# Patient Record
Sex: Female | Born: 1970 | Race: White | Hispanic: No | Marital: Single | Smoking: Never smoker
Health system: Southern US, Community
[De-identification: ages and names within clinical notes are randomized; demographics above are authoritative.]

## PROBLEM LIST (undated history)

## (undated) DIAGNOSIS — M7710 Lateral epicondylitis, unspecified elbow: Secondary | ICD-10-CM

## (undated) DIAGNOSIS — I10 Essential (primary) hypertension: Secondary | ICD-10-CM

## (undated) DIAGNOSIS — K219 Gastro-esophageal reflux disease without esophagitis: Secondary | ICD-10-CM

## (undated) DIAGNOSIS — M797 Fibromyalgia: Secondary | ICD-10-CM

## (undated) DIAGNOSIS — F419 Anxiety disorder, unspecified: Secondary | ICD-10-CM

## (undated) DIAGNOSIS — M5030 Other cervical disc degeneration, unspecified cervical region: Secondary | ICD-10-CM

## (undated) DIAGNOSIS — F329 Major depressive disorder, single episode, unspecified: Secondary | ICD-10-CM

## (undated) DIAGNOSIS — IMO0002 Reserved for concepts with insufficient information to code with codable children: Secondary | ICD-10-CM

## (undated) DIAGNOSIS — F4310 Post-traumatic stress disorder, unspecified: Secondary | ICD-10-CM

## (undated) DIAGNOSIS — R002 Palpitations: Secondary | ICD-10-CM

## (undated) DIAGNOSIS — G5600 Carpal tunnel syndrome, unspecified upper limb: Secondary | ICD-10-CM

## (undated) DIAGNOSIS — R42 Dizziness and giddiness: Secondary | ICD-10-CM

## (undated) DIAGNOSIS — M1990 Unspecified osteoarthritis, unspecified site: Secondary | ICD-10-CM

## (undated) DIAGNOSIS — F410 Panic disorder [episodic paroxysmal anxiety] without agoraphobia: Secondary | ICD-10-CM

## (undated) DIAGNOSIS — F32A Depression, unspecified: Secondary | ICD-10-CM

## (undated) DIAGNOSIS — J45909 Unspecified asthma, uncomplicated: Secondary | ICD-10-CM

## (undated) DIAGNOSIS — E876 Hypokalemia: Secondary | ICD-10-CM

## (undated) DIAGNOSIS — R569 Unspecified convulsions: Secondary | ICD-10-CM

## (undated) DIAGNOSIS — T7840XA Allergy, unspecified, initial encounter: Secondary | ICD-10-CM

## (undated) DIAGNOSIS — R55 Syncope and collapse: Secondary | ICD-10-CM

## (undated) DIAGNOSIS — G43909 Migraine, unspecified, not intractable, without status migrainosus: Secondary | ICD-10-CM

## (undated) HISTORY — DX: Gastro-esophageal reflux disease without esophagitis: K21.9

## (undated) HISTORY — PX: GALLBLADDER SURGERY: SHX652

## (undated) HISTORY — PX: CHOLECYSTECTOMY: SHX55

## (undated) HISTORY — PX: CARPAL TUNNEL RELEASE: SHX101

## (undated) HISTORY — DX: Syncope and collapse: R55

## (undated) HISTORY — DX: Depression, unspecified: F32.A

## (undated) HISTORY — DX: Allergy, unspecified, initial encounter: T78.40XA

## (undated) HISTORY — DX: Lateral epicondylitis, unspecified elbow: M77.10

## (undated) HISTORY — DX: Migraine, unspecified, not intractable, without status migrainosus: G43.909

## (undated) HISTORY — PX: SHOULDER SURGERY: SHX246

## (undated) HISTORY — DX: Essential (primary) hypertension: I10

## (undated) HISTORY — DX: Dizziness and giddiness: R42

## (undated) HISTORY — DX: Unspecified asthma, uncomplicated: J45.909

## (undated) HISTORY — DX: Unspecified osteoarthritis, unspecified site: M19.90

## (undated) HISTORY — DX: Fibromyalgia: M79.7

## (undated) HISTORY — DX: Major depressive disorder, single episode, unspecified: F32.9

## (undated) HISTORY — DX: Panic disorder (episodic paroxysmal anxiety): F41.0

## (undated) HISTORY — DX: Unspecified convulsions: R56.9

## (undated) HISTORY — DX: Reserved for concepts with insufficient information to code with codable children: IMO0002

## (undated) HISTORY — DX: Other cervical disc degeneration, unspecified cervical region: M50.30

## (undated) HISTORY — DX: Hypokalemia: E87.6

## (undated) HISTORY — DX: Anxiety disorder, unspecified: F41.9

## (undated) HISTORY — DX: Carpal tunnel syndrome, unspecified upper limb: G56.00

---

## 1991-03-19 HISTORY — PX: SPINE SURGERY: SHX786

## 1991-03-19 HISTORY — PX: BACK SURGERY: SHX140

## 2005-08-09 ENCOUNTER — Ambulatory Visit: Payer: Self-pay | Admitting: Family Medicine

## 2005-08-15 ENCOUNTER — Ambulatory Visit: Payer: Self-pay | Admitting: Family Medicine

## 2005-08-29 ENCOUNTER — Ambulatory Visit: Payer: Self-pay | Admitting: Family Medicine

## 2005-09-10 ENCOUNTER — Ambulatory Visit: Payer: Self-pay | Admitting: Family Medicine

## 2006-03-21 ENCOUNTER — Ambulatory Visit: Payer: Self-pay | Admitting: Family Medicine

## 2006-04-11 ENCOUNTER — Ambulatory Visit: Payer: Self-pay | Admitting: Family Medicine

## 2006-05-09 ENCOUNTER — Ambulatory Visit: Payer: Self-pay | Admitting: Family Medicine

## 2009-01-25 ENCOUNTER — Encounter: Admission: RE | Admit: 2009-01-25 | Discharge: 2009-01-25 | Payer: Self-pay | Admitting: Pain Medicine

## 2011-02-06 ENCOUNTER — Ambulatory Visit: Payer: Self-pay | Admitting: Physical Medicine and Rehabilitation

## 2012-06-11 ENCOUNTER — Other Ambulatory Visit: Payer: Self-pay | Admitting: Physical Medicine and Rehabilitation

## 2012-06-11 DIAGNOSIS — M5416 Radiculopathy, lumbar region: Secondary | ICD-10-CM

## 2012-06-17 ENCOUNTER — Ambulatory Visit
Admission: RE | Admit: 2012-06-17 | Discharge: 2012-06-17 | Disposition: A | Discharge status: Home / Self Care | Payer: BC Managed Care – PPO | Source: Ambulatory Visit | Attending: Physical Medicine and Rehabilitation | Admitting: Physical Medicine and Rehabilitation

## 2012-06-17 DIAGNOSIS — M5416 Radiculopathy, lumbar region: Secondary | ICD-10-CM

## 2012-06-17 MED ORDER — GADOBENATE DIMEGLUMINE 529 MG/ML IV SOLN
12.0000 mL | Freq: Once | INTRAVENOUS | Status: AC | PRN
  Administered 2012-06-17: 12 mL via INTRAVENOUS

## 2012-07-29 ENCOUNTER — Encounter: Payer: Self-pay | Admitting: Neurology

## 2012-07-29 ENCOUNTER — Ambulatory Visit (INDEPENDENT_AMBULATORY_CARE_PROVIDER_SITE_OTHER): Payer: BC Managed Care – PPO | Admitting: Neurology

## 2012-07-29 VITALS — BP 122/85 | HR 87 | Ht 64.5 in | Wt 144.0 lb

## 2012-07-29 DIAGNOSIS — R569 Unspecified convulsions: Secondary | ICD-10-CM | POA: Insufficient documentation

## 2012-07-29 DIAGNOSIS — R55 Syncope and collapse: Secondary | ICD-10-CM

## 2012-07-29 DIAGNOSIS — G253 Myoclonus: Secondary | ICD-10-CM

## 2012-07-29 NOTE — Progress Notes (Signed)
Reason for visit: Blackouts  Claire [name] is a 42 y.o. female  History of present illness:  Claire [name] is a 42 year old left-handed [name] with a history of fibromyalgia. The patient is sent to this office for evaluation of blackout events that began about 4 months ago. The patient lives alone, and both of the blackout events have occurred when she is by herself. The patient also has an anxiety and panic disorder. The patient has reported episodes of myoclonus that may occur during the night or during the day. The patient has had an event 4 months ago where she was getting ready for bed, and she woke up 6 hours later on the floor with bruises on her. The patient did not bite her tongue or lose control of the bowels or the bladder. The patient has had a second event similar to this, again unwitnessed. The patient may have episodes of slurred speech associated with myoclonus. The myoclonus affects the arms, and may occur on a daily basis. The patient has undergone an EEG study that she was told was unremarkable. The patient does not believe that she has had a scan of the brain. The patient denies any tongue biting or loss of control of the bowels or the bladder with the blackout episodes. No family history of seizures is noted. The patient is sent to this office for further evaluation. The patient is on clonazepam.  Past Medical History  Diagnosis Date  . Tennis elbow   . DDD (degenerative disc disease), cervical   . Vertigo   . Panic attacks   . Acid reflux   . Anxiety   . Migraine   . Fibromyalgia   . Carpal tunnel syndrome     bilateral  . Syncope and collapse     Past Surgical History  Procedure Laterality Date  . Gallbladder surgery    . Shoulder surgery Right   . Back surgery  1993    Family History  Problem Relation Age of Onset  . COPD Mother   . Bronchitis Mother   . Cancer - Colon Maternal Grandmother     Social history:  reports that she has never smoked. She  does not have any smokeless tobacco history on file. She reports that  drinks alcohol. She reports that she does not use illicit drugs.  Medications:  No current outpatient prescriptions on file prior to visit.   No current facility-administered medications on file prior to visit.    Allergies:  Allergies  Allergen Reactions  . Cymbalta (Duloxetine Hcl)   . Penicillins   . Sulfa Antibiotics     ROS:  Out of a complete 14 system review of symptoms, the patient complains only of the following symptoms, and all other reviewed systems are negative.  Fevers, chills, fatigue Chest pain, swelling in the legs Hearing loss, ringing in the ears, stinging sensations, difficulty swallowing Itching, moles Blurred vision Shortness of breath, cough, wheezing, snoring Diarrhea, constipation Easy bruising Feeling hot, cold, flushing Joint pain, joint swelling, muscle cramps, achy muscles Allergies Memory loss, confusion, headache, numbness, weakness, slurred speech, dizziness, seizure episodes, tremors, pseudoseizures Anxiety, insomnia, the crease appetite, racing thoughts, restless legs  Blood pressure 122/85, pulse 87, height 5' 4.5" (1.638 m), weight 144 lb (65.318 kg).  Physical Exam  General: The patient is alert and cooperative at the time of the examination.  Head: Pupils are equal, round, and reactive to light. Discs are flat bilaterally.  Neck: The neck is supple, no  carotid bruits are noted.  Respiratory: The respiratory examination is clear.  Cardiovascular: The cardiovascular examination reveals a regular rate and rhythm, no obvious murmurs or rubs are noted.  Skin: Extremities are without significant edema.  Neurologic Exam  Mental status:  Cranial nerves: Facial symmetry is present. There is good sensation of the face to pinprick and soft touch on the left, decreased on the right. The patient does not split midline with vibration sensation on the forehead. The  strength of the facial muscles and the muscles to head turning and shoulder shrug are normal bilaterally. Speech is well enunciated, no aphasia or dysarthria is noted. Extraocular movements are full. Visual fields are full.  Motor: The motor testing reveals 5 over 5 strength of all 4 extremities. Good symmetric motor tone is noted throughout.  Sensory: Sensory testing is intact to pinprick, soft touch, vibration sensation, and position sense on all 4 extremities, with the exception that pinprick sensation is decreased on the left arm and right leg. No evidence of extinction is noted.  Coordination: Cerebellar testing reveals good finger-nose-finger and heel-to-shin bilaterally.  Gait and station: Gait is normal. Tandem gait is normal. Romberg is negative. No drift is seen.  Reflexes: Deep tendon reflexes are symmetric and normal bilaterally, with the exception that the right ankle jerk reflex is absent. Toes are downgoing bilaterally.   Assessment/Plan:  One. Fibromyalgia  2. Episodes of syncope, rule out seizures  3. Myoclonus  The patient has a history of 2 unwitnessed blackout events. The patient is having myoclonus independent of the blackouts. The patient will undergo an EEG evaluation, and a MRI of the brain. If these studies are unremarkable, an empiric trial on Keppra may be done. It is possible that the events may be psychogenic. The patient has a significant anxiety disorder.  Claire Palau MD 07/29/2012 8:05 PM  [facility] [address] [place], [place] [address]  Phone [phone] Fax [phone]

## 2012-08-12 ENCOUNTER — Telehealth: Payer: Self-pay | Admitting: Neurology

## 2012-08-12 ENCOUNTER — Ambulatory Visit (INDEPENDENT_AMBULATORY_CARE_PROVIDER_SITE_OTHER): Payer: BC Managed Care – PPO

## 2012-08-12 ENCOUNTER — Ambulatory Visit (INDEPENDENT_AMBULATORY_CARE_PROVIDER_SITE_OTHER): Payer: BC Managed Care – PPO | Admitting: Radiology

## 2012-08-12 DIAGNOSIS — G253 Myoclonus: Secondary | ICD-10-CM

## 2012-08-12 DIAGNOSIS — R55 Syncope and collapse: Secondary | ICD-10-CM

## 2012-08-12 NOTE — Telephone Encounter (Signed)
I called patient. EEG study was unremarkable. The patient has undergone the MRI the brain, I do not have the results yet.

## 2012-08-12 NOTE — Procedures (Signed)
  History:  Claire [name] is a 42 year old patient with a history of fibromyalgia. The patient has had 2 episodes of loss of consciousness that occurred at home, unwitnessed. The patient is being evaluated for possible seizures.  This is a routine EEG. No skull defects are noted. Medications include amitriptyline, clonazepam, Flexeril, hydrocodone, and Savella.  EEG classification: Normal awake  Description of the recording: The background rhythms of this recording consists of a fairly well modulated medium amplitude alpha rhythm of 9 Hz that is reactive to eye opening and closure. As the record progresses, the patient appears to remain in the waking state throughout the recording. Photic stimulation was performed, resulting in a bilateral and symmetric photic driving response. Hyperventilation was not performed. At no time during the recording does there appear to be evidence of spike or spike wave discharges or evidence of focal slowing. EKG monitor shows no evidence of cardiac rhythm abnormalities with a heart rate of 90.  Impression: This is a normal EEG recording in the waking state. No evidence of ictal or interictal discharges are seen.

## 2012-08-14 ENCOUNTER — Telehealth: Payer: Self-pay | Admitting: Neurology

## 2012-08-14 MED ORDER — LEVETIRACETAM 250 MG PO TABS: 250.0000 mg | ORAL_TABLET | Freq: Two times a day (BID) | ORAL | Status: DC

## 2012-08-14 MED ORDER — GADOPENTETATE DIMEGLUMINE 469.01 MG/ML IV SOLN: 13.0000 mL | Freq: Once | INTRAVENOUS | Status: AC | PRN

## 2012-08-14 NOTE — Telephone Encounter (Signed)
I called patient. The MRI study of the brain was normal. The patient indicates that she is still having events of jerking and inability to respond. I will start low-dose Keppra. The EEG study was also normal.

## 2013-01-14 ENCOUNTER — Ambulatory Visit: Payer: BC Managed Care – PPO | Admitting: Neurology

## 2013-01-21 ENCOUNTER — Other Ambulatory Visit: Payer: Self-pay

## 2013-02-12 ENCOUNTER — Other Ambulatory Visit: Payer: Self-pay | Admitting: Neurology

## 2013-08-22 ENCOUNTER — Other Ambulatory Visit: Payer: Self-pay | Admitting: Neurology

## 2013-08-23 ENCOUNTER — Telehealth: Payer: Self-pay | Admitting: Neurology

## 2013-08-23 NOTE — Telephone Encounter (Signed)
Left message for Claire [name] at [facility] about second request for medical records and STD form.  I have faxed the form for the second time and also all medical records including Office visit from 07-29-13, MRI from 08-14-13 and EEG 08-12-13.  Received a confirmation of fax being received.

## 2013-09-29 ENCOUNTER — Other Ambulatory Visit: Payer: Self-pay | Admitting: Neurology

## 2013-10-07 ENCOUNTER — Other Ambulatory Visit (HOSPITAL_COMMUNITY): Payer: Self-pay | Admitting: Family Medicine

## 2013-10-07 ENCOUNTER — Ambulatory Visit (HOSPITAL_COMMUNITY)
Admission: RE | Admit: 2013-10-07 | Discharge: 2013-10-07 | Disposition: A | Discharge status: Home / Self Care | Payer: Disability Insurance | Source: Ambulatory Visit | Attending: Family Medicine | Admitting: Family Medicine

## 2013-10-07 DIAGNOSIS — M5137 Other intervertebral disc degeneration, lumbosacral region: Secondary | ICD-10-CM | POA: Insufficient documentation

## 2013-10-07 DIAGNOSIS — M545 Low back pain: Secondary | ICD-10-CM

## 2013-10-07 DIAGNOSIS — M25531 Pain in right wrist: Secondary | ICD-10-CM

## 2013-10-07 DIAGNOSIS — M25512 Pain in left shoulder: Secondary | ICD-10-CM

## 2013-10-07 DIAGNOSIS — M25539 Pain in unspecified wrist: Secondary | ICD-10-CM | POA: Insufficient documentation

## 2013-10-07 DIAGNOSIS — M51379 Other intervertebral disc degeneration, lumbosacral region without mention of lumbar back pain or lower extremity pain: Secondary | ICD-10-CM | POA: Insufficient documentation

## 2013-10-07 DIAGNOSIS — M25532 Pain in left wrist: Secondary | ICD-10-CM

## 2013-10-21 ENCOUNTER — Other Ambulatory Visit (HOSPITAL_COMMUNITY): Payer: Self-pay | Admitting: Physical Medicine and Rehabilitation

## 2013-10-21 ENCOUNTER — Ambulatory Visit (HOSPITAL_COMMUNITY)
Admission: RE | Admit: 2013-10-21 | Discharge: 2013-10-21 | Disposition: A | Discharge status: Home / Self Care | Payer: BC Managed Care – PPO | Source: Ambulatory Visit | Attending: Physical Medicine and Rehabilitation | Admitting: Physical Medicine and Rehabilitation

## 2013-10-21 DIAGNOSIS — R52 Pain, unspecified: Secondary | ICD-10-CM

## 2013-10-21 DIAGNOSIS — M25569 Pain in unspecified knee: Secondary | ICD-10-CM | POA: Insufficient documentation

## 2013-10-29 ENCOUNTER — Other Ambulatory Visit: Payer: Self-pay | Admitting: Neurology

## 2013-12-01 ENCOUNTER — Other Ambulatory Visit: Payer: Self-pay | Admitting: Neurology

## 2013-12-02 NOTE — Telephone Encounter (Signed)
Tried to call patient, however number on file says it's temoprarily out of service.

## 2014-07-28 ENCOUNTER — Encounter: Payer: Self-pay | Admitting: Neurology

## 2014-07-28 ENCOUNTER — Ambulatory Visit (INDEPENDENT_AMBULATORY_CARE_PROVIDER_SITE_OTHER): Payer: Self-pay | Admitting: Neurology

## 2014-07-28 VITALS — BP 110/83 | HR 76 | Ht 64.0 in | Wt 161.2 lb

## 2014-07-28 DIAGNOSIS — R55 Syncope and collapse: Secondary | ICD-10-CM

## 2014-07-28 DIAGNOSIS — M797 Fibromyalgia: Secondary | ICD-10-CM

## 2014-07-28 NOTE — Patient Instructions (Signed)
Fibromyalgia Fibromyalgia is a disorder that is often misunderstood. It is associated with muscular pains and tenderness that comes and goes. It is often associated with fatigue and sleep disturbances. Though it tends to be long-lasting, fibromyalgia is not life-threatening. CAUSES  The exact cause of fibromyalgia is unknown. People with certain gene types are predisposed to developing fibromyalgia and other conditions. Certain factors can play a role as triggers, such as:  Spine disorders.  Arthritis.  Severe injury (trauma) and other physical stressors.  Emotional stressors. SYMPTOMS   The main symptom is pain and stiffness in the muscles and joints, which can vary over time.  Sleep and fatigue problems. Other related symptoms may include:  Bowel and bladder problems.  Headaches.  Visual problems.  Problems with odors and noises.  Depression or mood changes.  Painful periods (dysmenorrhea).  Dryness of the skin or eyes. DIAGNOSIS  There are no specific tests for diagnosing fibromyalgia. Patients can be diagnosed accurately from the specific symptoms they have. The diagnosis is made by determining that nothing else is causing the problems. TREATMENT  There is no cure. Management includes medicines and an active, healthy lifestyle. The goal is to enhance physical fitness, decrease pain, and improve sleep. HOME CARE INSTRUCTIONS   Only take over-the-counter or prescription medicines as directed by your caregiver. Sleeping pills, tranquilizers, and pain medicines may make your problems worse.  Low-impact aerobic exercise is very important and advised for treatment. At first, it may seem to make pain worse. Gradually increasing your tolerance will overcome this feeling.  Learning relaxation techniques and how to control stress will help you. Biofeedback, visual imagery, hypnosis, muscle relaxation, yoga, and meditation are all options.  Anti-inflammatory medicines and  physical therapy may provide short-term help.  Acupuncture or massage treatments may help.  Take muscle relaxant medicines as suggested by your caregiver.  Avoid stressful situations.  Plan a healthy lifestyle. This includes your diet, sleep, rest, exercise, and friends.  Find and practice a hobby you enjoy.  Join a fibromyalgia support group for interaction, ideas, and sharing advice. This may be helpful. SEEK MEDICAL CARE IF:  You are not having good results or improvement from your treatment. FOR MORE INFORMATION  National Fibromyalgia Association: www.fmaware.[place]: www.arthritis.org Document Released: 03/04/2005 Document Revised: 05/27/2011 Document Reviewed: 06/14/2009 [facility] Patient Information 2015 [place], [place]. This information is not intended to replace advice given to you by your health care provider. Make sure you discuss any questions you have with your health care provider. Fibromyalgia Fibromyalgia is a disorder that is often misunderstood. It is associated with muscular pains and tenderness that comes and goes. It is often associated with fatigue and sleep disturbances. Though it tends to be long-lasting, fibromyalgia is not life-threatening. CAUSES  The exact cause of fibromyalgia is unknown. People with certain gene types are predisposed to developing fibromyalgia and other conditions. Certain factors can play a role as triggers, such as:  Spine disorders.  Arthritis.  Severe injury (trauma) and other physical stressors.  Emotional stressors. SYMPTOMS   The main symptom is pain and stiffness in the muscles and joints, which can vary over time.  Sleep and fatigue problems. Other related symptoms may include:  Bowel and bladder problems.  Headaches.  Visual problems.  Problems with odors and noises.  Depression or mood changes.  Painful periods (dysmenorrhea).  Dryness of the skin or eyes. DIAGNOSIS  There are no specific  tests for diagnosing fibromyalgia. Patients can be diagnosed accurately from the specific symptoms  they have. The diagnosis is made by determining that nothing else is causing the problems. TREATMENT  There is no cure. Management includes medicines and an active, healthy lifestyle. The goal is to enhance physical fitness, decrease pain, and improve sleep. HOME CARE INSTRUCTIONS   Only take over-the-counter or prescription medicines as directed by your caregiver. Sleeping pills, tranquilizers, and pain medicines may make your problems worse.  Low-impact aerobic exercise is very important and advised for treatment. At first, it may seem to make pain worse. Gradually increasing your tolerance will overcome this feeling.  Learning relaxation techniques and how to control stress will help you. Biofeedback, visual imagery, hypnosis, muscle relaxation, yoga, and meditation are all options.  Anti-inflammatory medicines and physical therapy may provide short-term help.  Acupuncture or massage treatments may help.  Take muscle relaxant medicines as suggested by your caregiver.  Avoid stressful situations.  Plan a healthy lifestyle. This includes your diet, sleep, rest, exercise, and friends.  Find and practice a hobby you enjoy.  Join a fibromyalgia support group for interaction, ideas, and sharing advice. This may be helpful. SEEK MEDICAL CARE IF:  You are not having good results or improvement from your treatment. FOR MORE INFORMATION  National Fibromyalgia Association: www.fmaware.[place]: www.arthritis.org Document Released: 03/04/2005 Document Revised: 05/27/2011 Document Reviewed: 06/14/2009 [facility] Patient Information 2015 [place], [place]. This information is not intended to replace advice given to you by your health care provider. Make sure you discuss any questions you have with your health care provider.

## 2014-07-28 NOTE — Progress Notes (Signed)
Reason for visit: Fibromyalgia  Claire [name] is an 44 y.o. female  History of present illness:  Claire [name] is a 44 year old left-handed [place] female with a history of syncopal events and myoclonus, last seen in May 2014. The patient had MRI evaluation of the brain that was unremarkable, and a normal EEG study. She was given an empiric trial on Keppra in low dose 250 mg twice daily, but she never returned for a revisit. The patient apparently has had some recent blackouts in March 2016. The patient again was alone at home. She blacked out shortly after getting up out of bed and walking. The patient indicated that she was dizzy, every time she tried to stand up she would faint. She was found to have a low blood pressure in the emergency room, the patient went to [facility]. The patient apparently had the Keppra dose increased to 500 mg twice daily. She indicates that she has not had any further blackout episodes. The patient has developed total body pain. She has neck pain, back pain, leg pain, arm pain, and some headache. The patient has been treated for fibromyalgia, she has found that the [facility] has been helpful, but she has not been able to get it, as she has no insurance. Lyrica and gabapentin in the past have not been helpful. The patient is trying to get on disability, and she comes to this office for further evaluation. She has had MRI evaluation of the lumbar spine done previously, this shows a right paracentral disc herniation L5 S1 level, but this does not appear to be impinging on the exiting nerve root. The patient does report a lot of dizziness with standing.  Past Medical History  Diagnosis Date  . Tennis elbow   . DDD (degenerative disc disease), cervical   . Vertigo   . Panic attacks   . Acid reflux   . Anxiety   . Migraine   . Fibromyalgia   . Carpal tunnel syndrome     bilateral  . Syncope and collapse     Past Surgical History  Procedure Laterality Date  .  Gallbladder surgery    . Shoulder surgery Right   . Back surgery  1993    Family History  Problem Relation Age of Onset  . COPD Mother   . Bronchitis Mother   . Cancer - Colon Maternal Grandmother     Social history:  reports that she has never smoked. She does not have any smokeless tobacco history on file. She reports that she drinks alcohol. She reports that she does not use illicit drugs.    Allergies  Allergen Reactions  . Claritin [Loratadine]   . Cymbalta [Duloxetine Hcl]   . Penicillins   . Sulfa Antibiotics     Medications:  Prior to Admission medications   Medication Sig Start Date End Date Taking? Authorizing Provider  amitriptyline (ELAVIL) 10 MG tablet Take 20 mg by mouth at bedtime.  07/16/12  Yes Historical Provider, MD  BLACK COHOSH PO Take 1 tablet by mouth 3 (three) times daily.   Yes Historical Provider, MD  Cyanocobalamin (B-12) 2500 MCG TABS Take 2,500 Units by mouth daily.   Yes Historical Provider, MD  HYDROcodone-acetaminophen (NORCO/VICODIN) 5-325 MG per tablet Take 5-325 tablets by mouth 4 (four) times daily. 07/16/12  Yes Historical Provider, MD  ibuprofen (ADVIL,MOTRIN) 800 MG tablet Take 800 mg by mouth every 8 (eight) hours as needed.   Yes Historical Provider, MD  levETIRAcetam (KEPPRA)  250 MG tablet TAKE ONE TABLET BY MOUTH EVERY 12 HOURS Patient taking differently: TAKE TWO TABLET BY MOUTH EVERY 12 HOURS 10/29/13  Yes Kathrynn Ducking, MD  Potassium 99 MG TABS Take 2 tablets by mouth daily.    Yes Historical Provider, MD  tiZANidine (ZANAFLEX) 4 MG tablet Take 4 mg by mouth every 8 (eight) hours as needed for muscle spasms.   Yes Historical Provider, MD  SAVELLA 100 MG TABS tablet Take 100 mg by mouth 2 (two) times daily. 06/24/12   Historical Provider, MD    ROS:  Out of a complete 14 system review of symptoms, the patient complains only of the following symptoms, and all other reviewed systems are negative.  Decreased appetite, fatigue Hearing  loss, ear pain, ringing in the ears Eye itching, light sensitivity, eye pain, blurred vision Wheezing, shortness of breath, chest tightness Excessive thirst Restless legs, insomnia, frequent waking, snoring Joint pain, joint swelling, back pain, achy muscles, muscle cramps, walking difficulty, neck pain, neck stiffness Itching Bruising easily Memory loss, dizziness, headache, numbness, seizures, weakness, tremors Agitation, confusion, depression, anxiety  Blood pressure 110/83, pulse 76, height 5\' 4"  (1.626 m), weight 161 lb 3.2 oz (73.12 kg).   Blood pressure, right arm, standing is 144/90. Blood pressure, sitting, right arm is 136/90.  Physical Exam  General: The patient is alert and cooperative at the time of the examination.  Neuromuscular: The patient lacks about 15 of lateral rotation of the cervical spine bilaterally. The patient has good full flexion of the low back.  Skin: No significant peripheral edema is noted.   Neurologic Exam  Mental status: The patient is alert and oriented x 3 at the time of the examination. The patient has apparent normal recent and remote memory, with an apparently normal attention span and concentration ability.   Cranial nerves: Facial symmetry is present. Speech is normal, no aphasia or dysarthria is noted. Extraocular movements are full. Visual fields are full.  Motor: The patient has good strength in all 4 extremities.  Sensory examination: Soft touch sensation is decreased on the left face, arm, and leg. The patient has decreased vibration sensation on the left face, arm, and leg. The patient splits the midline with vibration sensation on the forehead, decreased on the left.  Coordination: The patient has good finger-nose-finger and heel-to-shin bilaterally.  Gait and station: The patient has a limping type gait. Tandem gait is slightly unsteady. Romberg is negative. No drift is seen.  Reflexes: Deep tendon reflexes are  symmetric.   MRI lumbar 06/18/12:  IMPRESSION: Chronic lower thoracic and lumbar disc degeneration is largely stable. There is increased right paracentral disc herniation and L5-S1 which most affect the descending right S1 nerve roots in the lateral recess. Evidence of prior surgery on the left at L4-L5.  * MRI scan images were reviewed online. I agree with the written report.    Assessment/Plan:  1. Probable fibromyalgia, total body pain  2. 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 Nonorganic clinical examination  The MRI of the lumbar spine shows a paracentral disc herniation, this is likely not clinically significant. The patient has total body pain that likely represents fibromyalgia. The patient has no insurance, she is unable to afford the [facility] that seemed to help previously. The patient will have nerve conduction studies of the left arm, and both legs. We will do EMG evaluation on both legs. She will follow-up for the above study. The patient is getting her medications through the health department  currently. She has not worked in one year. A form was filled out to justify disability for the next 3 months. All of the syncopal events apparently have been unwitnessed. The patient has a nonorganic clinical examination, splitting the midline of the forehead with vibration sensation on the left, and a left hemisensory deficit that likely is nonorganic.  Jill Alexanders MD 07/28/2014 7:48 PM  [facility] [address] [place], [place] [address]  Phone [phone] Fax [phone]

## 2014-08-11 ENCOUNTER — Encounter: Payer: Self-pay | Admitting: Neurology

## 2014-08-11 ENCOUNTER — Ambulatory Visit (INDEPENDENT_AMBULATORY_CARE_PROVIDER_SITE_OTHER): Payer: Self-pay | Admitting: Neurology

## 2014-08-11 DIAGNOSIS — M797 Fibromyalgia: Secondary | ICD-10-CM

## 2014-08-11 DIAGNOSIS — R55 Syncope and collapse: Secondary | ICD-10-CM

## 2014-08-11 MED ORDER — TRAMADOL HCL 50 MG PO TABS: 50.0000 mg | ORAL_TABLET | Freq: Four times a day (QID) | ORAL | Status: DC | PRN

## 2014-08-11 MED ORDER — AMITRIPTYLINE HCL 25 MG PO TABS: ORAL_TABLET | ORAL | Status: DC

## 2014-08-11 NOTE — Progress Notes (Signed)
Please refer to EMG and nerve conduction study procedure note. 

## 2014-08-11 NOTE — Procedures (Signed)
     HISTORY:   Claire [name] is a 44 year old patient with a history of chronic diffuse neuromuscular discomfort. She has had lumbosacral spine surgery done in 1993 , and she reports ongoing discomfort in both legs, left greater than right. She is being evaluated for a possible neuropathy or a lumbar radiculopathy.  NERVE CONDUCTION STUDIES:  Nerve conduction studies were performed on the left upper extremity. The distal motor latencies and motor amplitudes for the median and ulnar nerves were within normal limits. The F wave latencies and nerve conduction velocities for these nerves were also normal. The sensory latencies for the median and ulnar nerves were normal.  Nerve conduction studies were performed on both lower extremities. The distal motor latencies and motor amplitudes for the peroneal and posterior tibial nerves were within normal limits. The nerve conduction velocities for these nerves were also normal. The H reflex latencies were normal. The sensory latencies for the peroneal nerves were within normal limits.   EMG STUDIES:  EMG study was performed on the right lower extremity:  The tibialis anterior muscle reveals 2 to 4K motor units with full recruitment. No fibrillations or positive waves were seen. The peroneus tertius muscle reveals 2 to 4K motor units with full recruitment. No fibrillations or positive waves were seen. The medial gastrocnemius muscle reveals 1 to 3K motor units with full recruitment. No fibrillations or positive waves were seen. The vastus lateralis muscle reveals 2 to 4K motor units with full recruitment. No fibrillations or positive waves were seen. The iliopsoas muscle reveals 2 to 4K motor units with full recruitment. No fibrillations or positive waves were seen. The biceps femoris muscle (long head) reveals 2 to 4K motor units with full recruitment. No fibrillations or positive waves were seen. The lumbosacral paraspinal muscles were tested at 3  levels, and revealed no abnormalities of insertional activity at all 3 levels tested. There was good relaxation.  EMG study was performed on the left lower extremity:  The tibialis anterior muscle reveals 2 to 4K motor units with full recruitment. No fibrillations or positive waves were seen. The peroneus tertius muscle reveals 2 to 4K motor units with full recruitment. No fibrillations or positive waves were seen. The medial gastrocnemius muscle reveals 1 to 3K motor units with full recruitment. No fibrillations or positive waves were seen. The vastus lateralis muscle reveals 2 to 4K motor units with full recruitment. No fibrillations or positive waves were seen. The iliopsoas muscle reveals 2 to 4K motor units with full recruitment. No fibrillations or positive waves were seen. The biceps femoris muscle (long head) reveals 2 to 4K motor units with full recruitment. No fibrillations or positive waves were seen. The lumbosacral paraspinal muscles were tested at 3 levels, and revealed no abnormalities of insertional activity at all 3 levels tested. There was good relaxation.   IMPRESSION:  Nerve conduction studies done on the left upper extremity and both lower extremities were unremarkable. There is no evidence of a peripheral neuropathy. EMG evaluation of both lower extremities were unremarkable, without evidence of an overlying lumbosacral radiculopathy.  Jill Alexanders MD 08/11/2014 1:52 PM  [facility] [address] [place],  [address]  Phone [phone] Fax [phone]

## 2014-08-11 NOTE — Progress Notes (Signed)
Nerve conduction study done on the left arm and both legs today was normal. EMG of both legs were normal. The patient is not on a medications for discomfort at this time. I will start the amitriptyline at 25 mg daily for 3 weeks, then go to 50 mg at night.   The patient will follow-up in 3 to 4 months.

## 2014-08-12 ENCOUNTER — Telehealth: Payer: Self-pay | Admitting: Neurology

## 2014-08-12 NOTE — Telephone Encounter (Signed)
Pt called and stated she was supposed to get a Rx.traMADol (ULTRAM) 50 MG tablet but the pharmacy is stating they did not receive it. Please call and advise.   [id number] - [place], [place] - [place]

## 2014-08-12 NOTE — Telephone Encounter (Signed)
This Rx was sent.  I called the pharmacy.  They verified they did get the Rx, and are still in the process of filling it.  They will notify patient when itis ready for pick up.  I called the patient back to advise.  Got no answer.  Left message.

## 2014-09-12 ENCOUNTER — Telehealth: Payer: Self-pay | Admitting: Neurology

## 2014-09-12 MED ORDER — KETOROLAC TROMETHAMINE 10 MG PO TABS: 10.0000 mg | ORAL_TABLET | Freq: Three times a day (TID) | ORAL | Status: DC | PRN

## 2014-09-12 NOTE — Telephone Encounter (Signed)
Patient called stating that she has been sick since taking traMADol (ULTRAM) 50 MG tablet. Patient is experiencing light head, dizziness, nausea and weak. Please call and advise. Patient can be reached @ [phone]

## 2014-09-12 NOTE — Telephone Encounter (Signed)
I called the patient. She stated that she has felt dizzy and nauseous since starting Tramadol. I asked her when she had the prescription filled and she stated 5/27. I also asked if she has felt this way ever since she started taking it and she said yes. She would like to know if the Tramadol can be switched to another medication.

## 2014-09-12 NOTE — Telephone Encounter (Signed)
I called patient. The patient indicates that Ultram makes her dizzy. She will stop this medication, I will call in Toradol if needed for pain.

## 2014-09-13 NOTE — Telephone Encounter (Addendum)
Patient called inquiring if  And what pain medication had been called to pharmacy. She call [facility] and was told nothing had been called. Please call and advise. Patient can be reached at [phone].

## 2014-09-13 NOTE — Telephone Encounter (Signed)
It appears a new Rx was sent by provider yesterday.  I called the pharmacy.  Spoke with [employer]  She verified the Rx is ready, but patient has not picked it up.  I called the patient back.  She is aware and will follow up with the pharmacy.  She will cal [place] back if anything further is needed.

## 2014-09-16 ENCOUNTER — Ambulatory Visit: Payer: Self-pay | Admitting: Neurology

## 2014-09-29 ENCOUNTER — Telehealth: Payer: Self-pay | Admitting: Neurology

## 2014-09-29 MED ORDER — TAPENTADOL HCL 50 MG PO TABS: 50.0000 mg | ORAL_TABLET | Freq: Four times a day (QID) | ORAL | Status: DC | PRN

## 2014-09-29 NOTE — Telephone Encounter (Signed)
Patient called and stated that the pain medication she has been taking is causing problems with her ulcer. She has not been able to eat recently because of the pain and would like to know if there is something else she can take. Please call and advise.

## 2014-09-29 NOTE — Telephone Encounter (Signed)
I called the patient. The patient indicates that the Toradol worsens her reflux problems, she cannot take it. She got nauseated from Ultram, I will try Nucynta to see if this helps.

## 2014-09-29 NOTE — Telephone Encounter (Signed)
I spoke to the patient. She stated the Toradol did not help with the pain and it is making her stomach ulcers worse. She stated that ever since she started it she has had terrible heartburn. She would like to try a different pain medication.

## 2014-09-29 NOTE — Telephone Encounter (Signed)
I called the patient. Left voicemail.

## 2014-09-30 ENCOUNTER — Telehealth: Payer: Self-pay

## 2014-09-30 NOTE — Telephone Encounter (Signed)
Rx ready for pick up. 

## 2014-10-03 NOTE — Telephone Encounter (Signed)
Patient called stating she will pick up RX today or tomorrow

## 2014-10-10 ENCOUNTER — Telehealth: Payer: Self-pay | Admitting: Neurology

## 2014-10-10 MED ORDER — TAPENTADOL HCL 50 MG PO TABS: 50.0000 mg | ORAL_TABLET | Freq: Four times a day (QID) | ORAL | Status: DC | PRN

## 2014-10-10 NOTE — Telephone Encounter (Signed)
Patient called requesting refill for tapentadol (NUCYNTA) 50 MG TABS tablet and also she needs appt with Dr Jannifer Franklin. She has paperwork to be filled out for Ins purposes. Please call to schedule an appt time for her. She can be reached at [phone].

## 2014-10-10 NOTE — Telephone Encounter (Signed)
Patient called returning [facility] phone call.

## 2014-10-10 NOTE — Telephone Encounter (Signed)
Patient is requesting a Rx for Nucynta.  This was last prescribed on 07/14 for #30.  I called the pharmacy, and they verified this Rx was filled and picked up by the patient.  Would you like to refill at this time?  Please advise.  Thank you.

## 2014-10-10 NOTE — Telephone Encounter (Signed)
I will write her prescription for 60 tablets, must last 28 days.

## 2014-10-10 NOTE — Telephone Encounter (Signed)
I called patient and left a voicemail. Will try again.

## 2014-10-10 NOTE — Telephone Encounter (Signed)
Appointment scheduled for 8/9 at 12 PM.

## 2014-10-11 ENCOUNTER — Telehealth: Payer: Self-pay

## 2014-10-11 NOTE — Telephone Encounter (Signed)
Rx ready for pick up. 

## 2014-10-25 ENCOUNTER — Ambulatory Visit (INDEPENDENT_AMBULATORY_CARE_PROVIDER_SITE_OTHER): Payer: Self-pay | Admitting: Neurology

## 2014-10-25 ENCOUNTER — Encounter: Payer: Self-pay | Admitting: Neurology

## 2014-10-25 VITALS — BP 124/95 | HR 92 | Ht 63.0 in | Wt 161.6 lb

## 2014-10-25 DIAGNOSIS — M797 Fibromyalgia: Secondary | ICD-10-CM

## 2014-10-25 DIAGNOSIS — R251 Tremor, unspecified: Secondary | ICD-10-CM

## 2014-10-25 MED ORDER — AMITRIPTYLINE HCL 25 MG PO TABS: 75.0000 mg | ORAL_TABLET | Freq: Every day | ORAL | Status: DC

## 2014-10-25 NOTE — Progress Notes (Signed)
Reason for visit: Fibromyalgia  Claire [name] is an 44 y.o. female  History of present illness:  Claire [name] is a 44 year old left-handed [name] female with a history of fibromyalgia with total body pain. She has had nonorganic features to the sensory examination, with a left hemisensory deficit, splitting midline of the forehead with vibration sensation. The patient continues to have ongoing discomfort, she is out of work. She indicates that over the last 1 week she has developed tremors of both arms. The patient is on amitriptyline taking 50 mg night, she is still not sleeping well, still has ongoing discomfort. She is on Keppra, she reports no further syncopal events. She returns this office for an evaluation. No other significant new issues have been noted with exception of the tremor.  Past Medical History  Diagnosis Date  . Tennis elbow   . DDD (degenerative disc disease), cervical   . Vertigo   . Panic attacks   . Acid reflux   . Anxiety   . Migraine   . Fibromyalgia   . Carpal tunnel syndrome     bilateral  . Syncope and collapse     Past Surgical History  Procedure Laterality Date  . Gallbladder surgery    . Shoulder surgery Right   . Back surgery  1993    Family History  Problem Relation Age of Onset  . COPD Mother   . Bronchitis Mother   . Cancer - Colon Maternal Grandmother     Social history:  reports that she has never smoked. She has never used smokeless tobacco. She reports that she does not drink alcohol or use illicit drugs.    Allergies  Allergen Reactions  . Claritin [Loratadine]   . Ultram [Tramadol]     Dizzy  . Cymbalta [Duloxetine Hcl]   . Penicillins   . Sulfa Antibiotics     Medications:  Prior to Admission medications   Medication Sig Start Date End Date Taking? Authorizing Provider  amitriptyline (ELAVIL) 25 MG tablet 1 tablet at night for 3 weeks, then take 2 tablets at night 08/11/14  Yes Kathrynn Ducking, MD  levETIRAcetam  (KEPPRA) 500 MG tablet Take 500 mg by mouth 2 (two) times daily.   Yes Historical Provider, MD  Multiple Vitamin (MULTIVITAMIN) tablet Take 1 tablet by mouth daily.   Yes Historical Provider, MD  Potassium 99 MG TABS Take 2 tablets by mouth daily.    Yes Historical Provider, MD    ROS:  Out of a complete 14 system review of symptoms, the patient complains only of the following symptoms, and all other reviewed systems are negative.  Activity change excessive sweating Ear pain, ringing in the ears Shortness of breath, chest tightness Excessive thirst Restless legs, insomnia Frequency of urination Joint pain, joint swelling, back pain, achy muscles, muscle cramps, walking difficulty, neck pain, neck stiffness Memory loss, dizziness, headache, numbness, weakness, tremors Anxiety  Blood pressure 124/95, pulse 92, height 5\' 3"  (1.6 m), weight 161 lb 9.6 oz (73.301 kg).  Physical Exam  General: The patient is alert and cooperative at the time of the examination. The patient is moderately obese.  Skin: No significant peripheral edema is noted.   Neurologic Exam  Mental status: The patient is alert and oriented x 3 at the time of the examination. The patient has apparent normal recent and remote memory, with an apparently normal attention span and concentration ability.   Cranial nerves: Facial symmetry is present. Speech is normal, no  aphasia or dysarthria is noted. Extraocular movements are full. Visual fields are full.  Motor: The patient has good strength in all 4 extremities.  Sensory examination: Soft touch sensation is notable for decrease in soft touch and vibration sensation on the left arm and left leg. The patient has decreased sensation on the left face, splits the midline of with the forehead with vibration sensation..  Coordination: The patient has good finger-nose-finger and heel-to-shin bilaterally.  Gait and station: The patient has a normal gait. Tandem gait is  slightly unsteady. Romberg is negative. No drift is seen.  Reflexes: Deep tendon reflexes are symmetric.   Assessment/Plan:  1. Fibromyalgia  2. History of syncope  3. Tremor, new onset  4. Nonorganic neurologic examination  The patient continues to have a left hemisensory deficit that appears to be nonorganic in nature. She is having ongoing symptoms of fibromyalgia, apparently still out of work. I have signed a form indicating that she may return to work at any time from a neurologic standpoint. The patient will be increased on the amitriptyline taking 75 mg at night. She will have blood work to include a TSH to check for the etiology of the tremor. She will follow-up in 6 months, sooner if needed.  Jill Alexanders MD 10/25/2014 8:50 PM  [facility] [address] [place], [place] [address]  Phone [phone] Fax [phone]

## 2014-10-25 NOTE — Patient Instructions (Addendum)
   We will go up on amitriptyline taking 75 mg at night, we will check blood work today for the thyroid given the onset of tremor.   Fibromyalgia Fibromyalgia is a disorder that is often misunderstood. It is associated with muscular pains and tenderness that comes and goes. It is often associated with fatigue and sleep disturbances. Though it tends to be long-lasting, fibromyalgia is not life-threatening. CAUSES  The exact cause of fibromyalgia is unknown. People with certain gene types are predisposed to developing fibromyalgia and other conditions. Certain factors can play a role as triggers, such as:  Spine disorders.  Arthritis.  Severe injury (trauma) and other physical stressors.  Emotional stressors. SYMPTOMS   The main symptom is pain and stiffness in the muscles and joints, which can vary over time.  Sleep and fatigue problems. Other related symptoms may include:  Bowel and bladder problems.  Headaches.  Visual problems.  Problems with odors and noises.  Depression or mood changes.  Painful periods (dysmenorrhea).  Dryness of the skin or eyes. DIAGNOSIS  There are no specific tests for diagnosing fibromyalgia. Patients can be diagnosed accurately from the specific symptoms they have. The diagnosis is made by determining that nothing else is causing the problems. TREATMENT  There is no cure. Management includes medicines and an active, healthy lifestyle. The goal is to enhance physical fitness, decrease pain, and improve sleep. HOME CARE INSTRUCTIONS   Only take over-the-counter or prescription medicines as directed by your caregiver. Sleeping pills, tranquilizers, and pain medicines may make your problems worse.  Low-impact aerobic exercise is very important and advised for treatment. At first, it may seem to make pain worse. Gradually increasing your tolerance will overcome this feeling.  Learning relaxation techniques and how to control stress will help you.  Biofeedback, visual imagery, hypnosis, muscle relaxation, yoga, and meditation are all options.  Anti-inflammatory medicines and physical therapy may provide short-term help.  Acupuncture or massage treatments may help.  Take muscle relaxant medicines as suggested by your caregiver.  Avoid stressful situations.  Plan a healthy lifestyle. This includes your diet, sleep, rest, exercise, and friends.  Find and practice a hobby you enjoy.  Join a fibromyalgia support group for interaction, ideas, and sharing advice. This may be helpful. SEEK MEDICAL CARE IF:  You are not having good results or improvement from your treatment. FOR MORE INFORMATION  National Fibromyalgia Association: www.fmaware.[place]: www.arthritis.org Document Released: 03/04/2005 Document Revised: 05/27/2011 Document Reviewed: 06/14/2009 [facility] Patient Information 2015 [place], [place]. This information is not intended to replace advice given to you by your health care provider. Make sure you discuss any questions you have with your health care provider.

## 2014-10-26 ENCOUNTER — Telehealth: Payer: Self-pay

## 2014-10-26 LAB — TSH: TSH: 0.553 u[IU]/mL (ref 0.450–4.500)

## 2014-10-26 NOTE — Telephone Encounter (Signed)
I called the patient and relayed results. 

## 2014-10-26 NOTE — Telephone Encounter (Signed)
-----   Message from Kathrynn Ducking, MD sent at 10/26/2014  7:27 AM EDT -----  The blood work results are unremarkable. Please call the patient.  ----- Message -----    From: [facility] Lab Results In Interface    Sent: 10/26/2014   5:44 AM      To: Kathrynn Ducking, MD

## 2014-10-31 NOTE — Telephone Encounter (Signed)
Error

## 2015-03-24 NOTE — Telephone Encounter (Signed)
Patient did not pick up this Rx.

## 2015-04-04 ENCOUNTER — Encounter (HOSPITAL_COMMUNITY): Payer: Self-pay | Admitting: Emergency Medicine

## 2015-04-04 ENCOUNTER — Emergency Department (HOSPITAL_COMMUNITY)
Admission: EM | Admit: 2015-04-04 | Discharge: 2015-04-04 | Disposition: A | Discharge status: Home / Self Care | Payer: Self-pay | Attending: Emergency Medicine | Admitting: Emergency Medicine

## 2015-04-04 DIAGNOSIS — J45909 Unspecified asthma, uncomplicated: Secondary | ICD-10-CM | POA: Insufficient documentation

## 2015-04-04 DIAGNOSIS — F410 Panic disorder [episodic paroxysmal anxiety] without agoraphobia: Secondary | ICD-10-CM | POA: Insufficient documentation

## 2015-04-04 DIAGNOSIS — Z79899 Other long term (current) drug therapy: Secondary | ICD-10-CM | POA: Insufficient documentation

## 2015-04-04 DIAGNOSIS — R42 Dizziness and giddiness: Secondary | ICD-10-CM | POA: Insufficient documentation

## 2015-04-04 DIAGNOSIS — Z8679 Personal history of other diseases of the circulatory system: Secondary | ICD-10-CM | POA: Insufficient documentation

## 2015-04-04 DIAGNOSIS — Z8719 Personal history of other diseases of the digestive system: Secondary | ICD-10-CM | POA: Insufficient documentation

## 2015-04-04 DIAGNOSIS — Z8739 Personal history of other diseases of the musculoskeletal system and connective tissue: Secondary | ICD-10-CM | POA: Insufficient documentation

## 2015-04-04 DIAGNOSIS — R6884 Jaw pain: Secondary | ICD-10-CM | POA: Insufficient documentation

## 2015-04-04 DIAGNOSIS — J40 Bronchitis, not specified as acute or chronic: Secondary | ICD-10-CM

## 2015-04-04 DIAGNOSIS — Z8669 Personal history of other diseases of the nervous system and sense organs: Secondary | ICD-10-CM | POA: Insufficient documentation

## 2015-04-04 DIAGNOSIS — Z880 Allergy status to penicillin: Secondary | ICD-10-CM | POA: Insufficient documentation

## 2015-04-04 LAB — CBC WITH DIFFERENTIAL/PLATELET
Basophils Absolute: 0 10*3/uL (ref 0.0–0.1)
Basophils Relative: 0 %
Eosinophils Absolute: 0 10*3/uL (ref 0.0–0.7)
Eosinophils Relative: 0 %
HCT: 40 % (ref 36.0–46.0)
Hemoglobin: 14.1 g/dL (ref 12.0–15.0)
Lymphocytes Relative: 10 %
Lymphs Abs: 1.4 10*3/uL (ref 0.7–4.0)
MCH: 32.9 pg (ref 26.0–34.0)
MCHC: 35.3 g/dL (ref 30.0–36.0)
MCV: 93.5 fL (ref 78.0–100.0)
Monocytes Absolute: 0.4 10*3/uL (ref 0.1–1.0)
Monocytes Relative: 3 %
Neutro Abs: 11.3 10*3/uL — ABNORMAL HIGH (ref 1.7–7.7)
Neutrophils Relative %: 87 %
Platelets: 402 10*3/uL — ABNORMAL HIGH (ref 150–400)
RBC: 4.28 MIL/uL (ref 3.87–5.11)
RDW: 12.6 % (ref 11.5–15.5)
WBC: 13.2 10*3/uL — ABNORMAL HIGH (ref 4.0–10.5)

## 2015-04-04 LAB — COMPREHENSIVE METABOLIC PANEL
ALT: 16 U/L (ref 14–54)
AST: 18 U/L (ref 15–41)
Albumin: 4.2 g/dL (ref 3.5–5.0)
Alkaline Phosphatase: 61 U/L (ref 38–126)
Anion gap: 10 (ref 5–15)
BUN: 9 mg/dL (ref 6–20)
CO2: 22 mmol/L (ref 22–32)
Calcium: 9.3 mg/dL (ref 8.9–10.3)
Chloride: 107 mmol/L (ref 101–111)
Creatinine, Ser: 0.72 mg/dL (ref 0.44–1.00)
GFR calc Af Amer: >60 mL/min (ref >60)
GFR calc non Af Amer: >60 mL/min (ref >60)
Glucose, Bld: 135 mg/dL — ABNORMAL HIGH (ref 65–99)
Potassium: 3.9 mmol/L (ref 3.5–5.1)
Sodium: 139 mmol/L (ref 135–145)
Total Bilirubin: 0.4 mg/dL (ref 0.3–1.2)
Total Protein: 7.6 g/dL (ref 6.5–8.1)

## 2015-04-04 MED ORDER — HYDROCODONE-ACETAMINOPHEN 5-325 MG PO TABS: 1.0000 | ORAL_TABLET | Freq: Four times a day (QID) | ORAL | Status: DC | PRN

## 2015-04-04 MED ORDER — ONDANSETRON HCL 4 MG/2ML IJ SOLN
4.0000 mg | Freq: Once | INTRAMUSCULAR | Status: AC
  Administered 2015-04-04: 4 mg via INTRAVENOUS
  Filled 2015-04-04: qty 2

## 2015-04-04 MED ORDER — LORAZEPAM 2 MG/ML IJ SOLN
0.5000 mg | Freq: Once | INTRAMUSCULAR | Status: AC
  Administered 2015-04-04: 0.5 mg via INTRAVENOUS
  Filled 2015-04-04: qty 1

## 2015-04-04 MED ORDER — HYDROMORPHONE HCL 1 MG/ML IJ SOLN
1.0000 mg | Freq: Once | INTRAMUSCULAR | Status: AC
  Administered 2015-04-04: 1 mg via INTRAVENOUS
  Filled 2015-04-04: qty 1

## 2015-04-04 MED ORDER — SODIUM CHLORIDE 0.9 % IV BOLUS (SEPSIS)
1000.0000 mL | Freq: Once | INTRAVENOUS | Status: AC
  Administered 2015-04-04: 1000 mL via INTRAVENOUS

## 2015-04-04 NOTE — ED Notes (Signed)
Patient was dizzy with the standing vitals had to hold on to me and mom to balance while standing. JGW

## 2015-04-04 NOTE — ED Provider Notes (Signed)
CSN: [id number]     Arrival date & time 04/04/15  1429 History   First MD Initiated Contact with Patient 04/04/15 1913     Chief Complaint  Patient presents with  . Dizziness  . Migraine  . Asthma  . Fatigue  . Abdominal Cramping  . Weakness  . Nausea  . Jaw Pain     (Consider location/radiation/quality/duration/timing/severity/associated sxs/prior Treatment) Patient is a 45 y.o. female presenting with dizziness, migraines, asthma, cramps, and weakness. The history is provided by the patient (Patient complains of fatigue cough and facial pain. Patient was seen yesterday and had a CT head that was normal chest x-ray shows bronchitis).  Dizziness Quality:  Lightheadedness Severity:  Mild Onset quality:  Gradual Timing:  Constant Progression:  Waxing and waning Chronicity:  New Context: not when bending over   Associated symptoms: weakness   Associated symptoms: no chest pain, no diarrhea and no headaches   Migraine Pertinent negatives include no chest pain, no abdominal pain and no headaches.  Asthma Pertinent negatives include no chest pain, no abdominal pain and no headaches.  Abdominal Cramping Pertinent negatives include no chest pain, no abdominal pain and no headaches.  Weakness Pertinent negatives include no chest pain, no abdominal pain and no headaches.    Past Medical History  Diagnosis Date  . Tennis elbow   . DDD (degenerative disc disease), cervical   . Vertigo   . Panic attacks   . Acid reflux   . Anxiety   . Migraine   . Fibromyalgia   . Carpal tunnel syndrome     bilateral  . Syncope and collapse    Past Surgical History  Procedure Laterality Date  . Gallbladder surgery    . Shoulder surgery Right   . Back surgery  1993   Family History  Problem Relation Age of Onset  . COPD Mother   . Bronchitis Mother   . Cancer - Colon Maternal Grandmother    Social History  Substance Use Topics  . Smoking status: Never Smoker   . Smokeless tobacco:  Never Used  . Alcohol Use: No     Comment: Consumes 2-3 beverages daily   OB History    No data available     Review of Systems  Constitutional: Negative for appetite change and fatigue.  HENT: Negative for congestion, ear discharge and sinus pressure.   Eyes: Negative for discharge.  Respiratory: Negative for cough.   Cardiovascular: Negative for chest pain.  Gastrointestinal: Negative for abdominal pain and diarrhea.  Genitourinary: Negative for frequency and hematuria.  Musculoskeletal: Negative for back pain.  Skin: Negative for rash.  Neurological: Positive for dizziness and weakness. Negative for seizures and headaches.  Psychiatric/Behavioral: Negative for hallucinations.      Allergies  Claritin; Ultram; Cymbalta; Penicillins; and Sulfa antibiotics  Home Medications   Prior to Admission medications   Medication Sig Start Date End Date Taking? Authorizing Provider  amitriptyline (ELAVIL) 25 MG tablet Take 3 tablets (75 mg total) by mouth at bedtime. 10/25/14   Kathrynn Ducking, MD  HYDROcodone-acetaminophen (NORCO/VICODIN) 5-325 MG tablet Take 1 tablet by mouth every 6 (six) hours as needed. 04/04/15   Milton Ferguson, MD  levETIRAcetam (KEPPRA) 500 MG tablet Take 500 mg by mouth 2 (two) times daily.    Historical Provider, MD  Multiple Vitamin (MULTIVITAMIN) tablet Take 1 tablet by mouth daily.    Historical Provider, MD  Potassium 99 MG TABS Take 2 tablets by mouth daily.  Historical Provider, MD   BP 144/100 mmHg  Pulse 94  Temp(Src) 97.7 F (36.5 C) (Oral)  Resp 16  Ht 5\' 4"  (1.626 m)  Wt 160 lb (72.576 kg)  BMI 27.45 kg/m2  SpO2 100%  LMP 03/21/2015 Physical Exam  Constitutional: She is oriented to person, place, and time. She appears well-developed.  HENT:  Head: Normocephalic.  Tender left TMJ  Eyes: Conjunctivae and EOM are normal. No scleral icterus.  Neck: Neck supple. No thyromegaly present.  Cardiovascular: Normal rate and regular rhythm.  Exam  reveals no gallop and no friction rub.   No murmur heard. Pulmonary/Chest: No stridor. She has no wheezes. She has no rales. She exhibits no tenderness.  Abdominal: She exhibits no distension. There is no tenderness. There is no rebound.  Musculoskeletal: Normal range of motion. She exhibits no edema.  Lymphadenopathy:    She has no cervical adenopathy.  Neurological: She is oriented to person, place, and time. She exhibits normal muscle tone. Coordination normal.  Skin: No rash noted. No erythema.  Psychiatric: She has a normal mood and affect. Her behavior is normal.    ED Course  Procedures (including critical care time) Labs Review Labs Reviewed  CBC WITH DIFFERENTIAL/PLATELET - Abnormal; Notable for the following:    WBC 13.2 (*)    Platelets 402 (*)    Neutro Abs 11.3 (*)    All other components within normal limits  COMPREHENSIVE METABOLIC PANEL - Abnormal; Notable for the following:    Glucose, Bld 135 (*)    All other components within normal limits    Imaging Review No results found. I have personally reviewed and evaluated these images and lab results as part of my medical decision-making.   EKG Interpretation None      MDM   Final diagnoses:  Bronchitis    Chest x-ray yesterday shows bronchitis patient was put on Z-Pak and prednisone. Labs consistent with that. Patient given Vicodin for discomfort. 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

## 2015-04-04 NOTE — ED Notes (Signed)
Having weakness for 2 weeks.  Was seen by [facility] last night.  Here for same symptoms last night at [facility] with burning to neck and back.  Rates pain 10/10.  Hard to get history due to multiple complaints.  Dizziness for last 2 weeks with double vision.

## 2015-04-04 NOTE — Discharge Instructions (Signed)
Follow-up with a dentist for your pain your mouth.

## 2015-04-12 ENCOUNTER — Telehealth: Payer: Self-pay | Admitting: *Deleted

## 2015-04-12 NOTE — Telephone Encounter (Signed)
[place] sent to [facility] and Dr Jannifer Franklin 04/12/15.

## 2015-04-13 NOTE — Telephone Encounter (Signed)
Form completed, signed and returned to Donna. 

## 2015-04-14 ENCOUNTER — Telehealth: Payer: Self-pay | Admitting: *Deleted

## 2015-04-14 NOTE — Telephone Encounter (Signed)
[place] received,completed by Dr Jannifer Franklin and Charisse Klinefelter faxed 04/13/14.

## 2015-04-14 NOTE — Telephone Encounter (Signed)
Form faxed 04/14/15

## 2015-04-27 ENCOUNTER — Ambulatory Visit: Payer: Self-pay | Admitting: Nurse Practitioner

## 2015-05-15 ENCOUNTER — Ambulatory Visit (INDEPENDENT_AMBULATORY_CARE_PROVIDER_SITE_OTHER): Payer: Self-pay | Admitting: Nurse Practitioner

## 2015-05-15 ENCOUNTER — Telehealth: Payer: Self-pay | Admitting: Nurse Practitioner

## 2015-05-15 ENCOUNTER — Telehealth: Payer: Self-pay | Admitting: *Deleted

## 2015-05-15 ENCOUNTER — Encounter: Payer: Self-pay | Admitting: Nurse Practitioner

## 2015-05-15 VITALS — BP 130/86 | HR 80 | Ht 64.0 in | Wt 161.6 lb

## 2015-05-15 DIAGNOSIS — R55 Syncope and collapse: Secondary | ICD-10-CM

## 2015-05-15 DIAGNOSIS — G40909 Epilepsy, unspecified, not intractable, without status epilepticus: Secondary | ICD-10-CM

## 2015-05-15 DIAGNOSIS — R42 Dizziness and giddiness: Secondary | ICD-10-CM

## 2015-05-15 DIAGNOSIS — M797 Fibromyalgia: Secondary | ICD-10-CM

## 2015-05-15 DIAGNOSIS — G253 Myoclonus: Secondary | ICD-10-CM

## 2015-05-15 MED ORDER — LEVETIRACETAM 500 MG PO TABS: 500.0000 mg | ORAL_TABLET | Freq: Two times a day (BID) | ORAL | Status: DC

## 2015-05-15 MED ORDER — MILNACIPRAN HCL 12.5 & 25 & 50 MG PO MISC: ORAL | Status: DC

## 2015-05-15 NOTE — Telephone Encounter (Signed)
Patient said she needed a note again so she could get food stamps. The best number to contact patient is [phone]

## 2015-05-15 NOTE — Progress Notes (Signed)
[facility]  PATIENT: Claire [name] DOB: 29-Jan-1971   REASON FOR VISIT: Follow-up for fibromyalgia and tremor, syncopal events HISTORY FROM: Patient    HISTORY OF PRESENT ILLNESS: Claire [name], 45 year old female returns for follow-up. She was last seen in this office by Dr. Jannifer Franklin 8/9/ 2016. She has a history of fibromyalgia body aches and pains left hemisensory deficit tremors of both arms and syncopal events. She was dizzy this morning and fell backwards with no apparent injury EEG in the past and MRI of the brain have been normal. She is not working and according to Dr. Tobey Grim last note from a neurologic perspective she could return to work. She tells me today she is applying for disability. She has no insurance coverage and so is no longer being seen by pain management. She returns for reevaluation   HISTORY: Claire [name] is a 45 year old left-handed Claire [name] female with a history of fibromyalgia with total body pain. She has had nonorganic features to the sensory examination, with a left hemisensory deficit, splitting midline of the forehead with vibration sensation. The patient continues to have ongoing discomfort, she is out of work. She indicates that over the last 1 week she has developed tremors of both arms. The patient is on amitriptyline taking 50 mg night, she is still not sleeping well, still has ongoing discomfort. She is on Keppra, she reports no further syncopal events. She returns this office for an evaluation. No other significant new issues have been noted with exception of the tremor.   REVIEW OF SYSTEMS: Full 14 system review of systems performed and notable only for those listed, all others are neg:  Constitutional: neg  Cardiovascular: neg Ear/Nose/Throat: Ringing in the ears  Skin: neg Eyes: Blurred vision, sensitivity to light itching Respiratory: Cough shortness of breath Gastroitestinal: Constipation nausea and vomiting Hematology/Lymphatic: neg    Endocrine: Excessive thirst Musculoskeletal: Joint pain neck pain muscle cramps walking difficulty Stiffness Allergy/Immunology: neg Neurological: Memory loss headache numbness seizure disorder tremors syncopal episodes Psychiatric: Depression and anxiety Sleep : neg   ALLERGIES: Allergies  Allergen Reactions  . Claritin [Loratadine]   . Ultram [Tramadol]     Dizzy  . Cymbalta [Duloxetine Hcl]   . Penicillins   . Sulfa Antibiotics     HOME MEDICATIONS: Outpatient Prescriptions Prior to Visit  Medication Sig Dispense Refill  . levETIRAcetam (KEPPRA) 500 MG tablet Take 500 mg by mouth 2 (two) times daily.    . Potassium 99 MG TABS Take 2 tablets by mouth daily.     Marland Kitchen amitriptyline (ELAVIL) 25 MG tablet Take 3 tablets (75 mg total) by mouth at bedtime. (Patient not taking: Reported on 05/15/2015) 90 tablet 3  . HYDROcodone-acetaminophen (NORCO/VICODIN) 5-325 MG tablet Take 1 tablet by mouth every 6 (six) hours as needed. (Patient not taking: Reported on 05/15/2015) 20 tablet 0  . Multiple Vitamin (MULTIVITAMIN) tablet Take 1 tablet by mouth daily. Reported on 05/15/2015     No facility-administered medications prior to visit.    PAST MEDICAL HISTORY: Past Medical History  Diagnosis Date  . Tennis elbow   . DDD (degenerative disc disease), cervical   . Vertigo   . Panic attacks   . Acid reflux   . Anxiety   . Migraine   . Fibromyalgia   . Carpal tunnel syndrome     bilateral  . Syncope and collapse     PAST SURGICAL HISTORY: Past Surgical History  Procedure Laterality Date  . Gallbladder surgery    .  Shoulder surgery Right   . Back surgery  1993    FAMILY HISTORY: Family History  Problem Relation Age of Onset  . COPD Mother   . Bronchitis Mother   . Cancer - Colon Maternal Grandmother     SOCIAL HISTORY: Social History   Social History  . Marital Status: Single    Spouse Name: N/A  . Number of Children: 1  . Years of Education: HS   Occupational  History  .      unemployed   Social History Main Topics  . Smoking status: Never Smoker   . Smokeless tobacco: Never Used  . Alcohol Use: No     Comment: Consumes 2-3 beverages daily  . Drug Use: No  . Sexual Activity: Not on file   Other Topics Concern  . Not on file   Social History Narrative   Patient is left handed.   Patient drinks very little caffeine.     PHYSICAL EXAM  Filed Vitals:   05/15/15 1115  BP: 130/86  Pulse: 80  Height: 5\' 4"  (1.626 m)  Weight: 161 lb 9.6 oz (73.301 kg)   Body mass index is 27.72 kg/(m^2). General: The patient is alert and cooperative at the time of the examination. The patient is moderately obese. Skin: No significant peripheral edema is noted.   Neurologic Exam Mental status: The patient is alert and oriented x 3 at the time of the examination. The patient has apparent normal recent and remote memory, with an apparently normal attention span and concentration ability. Cranial nerves: Facial symmetry is present. Speech is normal, no aphasia or dysarthria is noted. Extraocular movements are full. Visual fields are full. Motor: The patient has good strength in all 4 extremities. Sensory examination: Soft touch sensation is notable for decrease in soft touch and vibration sensation on the left arm and left leg. The patient has decreased sensation on the left face, splits the midline at  the forehead with vibration sensation.. Coordination: The patient has good finger-nose-finger and heel-to-shin bilaterally. Mild outstretched tremor Gait and station: The patient has a normal gait. Tandem gait is slightly unsteady. Romberg is negative. No drift is seen. Ambulates with single-point cane Reflexes: Deep tendon reflexes are symmetric.    DIAGNOSTIC DATA (LABS, IMAGING, TESTING) - I reviewed patient records, labs, notes, testing and imaging myself where available.  Lab Results  Component Value Date   WBC 13.2* 04/04/2015   HGB 14.1  04/04/2015   HCT 40.0 04/04/2015   MCV 93.5 04/04/2015   PLT 402* 04/04/2015      Component Value Date/Time   NA 139 04/04/2015 1953   K 3.9 04/04/2015 1953   CL 107 04/04/2015 1953   CO2 22 04/04/2015 1953   GLUCOSE 135* 04/04/2015 1953   BUN 9 04/04/2015 1953   CREATININE 0.72 04/04/2015 1953   CALCIUM 9.3 04/04/2015 1953   PROT 7.6 04/04/2015 1953   ALBUMIN 4.2 04/04/2015 1953   AST 18 04/04/2015 1953   ALT 16 04/04/2015 1953   ALKPHOS 61 04/04/2015 1953   BILITOT 0.4 04/04/2015 1953   GFRNONAA >60 04/04/2015 1953   GFRAA >60 04/04/2015 1953    ASSESSMENT AND PLAN  45 y.o. year old female  has a past medical history of Tennis elbow; DDD (degenerative disc disease), cervical; Vertigo; Panic attacks; Acid reflux; Anxiety; Migraine; Fibromyalgia; Carpal tunnel syndrome; and Syncope and collapse. here to follow-up. MRI of the brain and EEG have been normal    The patient continues to  have a left hemisensory deficit that appears to be nonorganic in nature. She is having ongoing symptoms of fibromyalgia, apparently still out of work attempting to get disability. The patient will be placed on [place] for her fibromyalgia and she will get this through the company since she does not have insurance. She has stopped amitriptyline taking 75 mg at night. will check Keppra level today.  She will follow-up in 6 months, sooner if needed. Dennie Bible, [facility], [facility], APRN  [facility] [address] [place], [place] [address] [phone]

## 2015-05-15 NOTE — Patient Instructions (Signed)
Savella titration  Pack take as directed Rx to patient Will check Keppra level Continue Keppra at current dose Follow-up in 6-8 months

## 2015-05-15 NOTE — Telephone Encounter (Signed)
[place] received from [place] sent to Dr Jannifer Franklin 05/15/15.

## 2015-05-15 NOTE — Progress Notes (Signed)
I have read the note, and I agree with the clinical assessment and plan.  Atziri Zubiate KEITH   

## 2015-05-15 NOTE — Telephone Encounter (Signed)
I relayed to pt when she was here she needed to get a pcp, thru [facility] to assist with getting her food stamps.  ( she has [employer] financial aid).  I also told her and made requisition for her keppra level to go to [facility] ([facility] out pt laboratory to have this done as we have [facility] in this office.  She seemed to understand.

## 2015-05-17 DIAGNOSIS — Z139 Encounter for screening, unspecified: Secondary | ICD-10-CM

## 2015-05-17 NOTE — Telephone Encounter (Signed)
Claire [name] received,completed by Dr Jannifer Franklin faxed 05/17/15.

## 2015-05-29 DIAGNOSIS — Z139 Encounter for screening, unspecified: Secondary | ICD-10-CM

## 2015-11-09 NOTE — Congregational Nurse Program (Signed)
Congregational Nurse Program Note  Date of Encounter: 05/17/2015  Past Medical History: Past Medical History:  Diagnosis Date   Acid reflux    Anxiety    Carpal tunnel syndrome    bilateral   DDD (degenerative disc disease), cervical    Fibromyalgia    Migraine    Panic attacks    Syncope and collapse    Tennis elbow    Vertigo     Encounter Details: Follow-up appointment scheduled at [facility] on 05/17/2015, Norman Clay, RN [phone]

## 2015-11-13 ENCOUNTER — Ambulatory Visit: Payer: Self-pay | Admitting: Nurse Practitioner

## 2015-11-15 NOTE — Congregational Nurse Program (Signed)
Congregational Nurse Program Note  Date of Encounter: 05/29/2015  Past Medical History: Past Medical History:  Diagnosis Date   Acid reflux    Anxiety    Carpal tunnel syndrome    bilateral   DDD (degenerative disc disease), cervical    Fibromyalgia    Migraine    Panic attacks    Syncope and collapse    Tennis elbow    Vertigo     Encounter Details:  Seen in [facility], follow-up visit. Alta Corning RN(Patricia Fouke, [place]) [phone]

## 2015-11-15 NOTE — Congregational Nurse Program (Signed)
Congregational Nurse Program Note  Date of Encounter: 05/17/2015  Past Medical History: Past Medical History:  Diagnosis Date   Acid reflux    Anxiety    Carpal tunnel syndrome    bilateral   DDD (degenerative disc disease), cervical    Fibromyalgia    Migraine    Panic attacks    Syncope and collapse    Tennis elbow    Vertigo     Encounter Details: BP 112/88, Pulse 72, follow-up referral made [facility] Department for May 17, 2015 at 11:00am, Mariam Dollar, RN/Patricia Tawny Asal, RN [place] [facility]) [phone]

## 2016-03-07 DIAGNOSIS — M1990 Unspecified osteoarthritis, unspecified site: Secondary | ICD-10-CM | POA: Diagnosis not present

## 2016-03-07 DIAGNOSIS — I4891 Unspecified atrial fibrillation: Secondary | ICD-10-CM | POA: Diagnosis not present

## 2016-03-07 DIAGNOSIS — R1110 Vomiting, unspecified: Secondary | ICD-10-CM | POA: Diagnosis not present

## 2016-03-07 DIAGNOSIS — Z79899 Other long term (current) drug therapy: Secondary | ICD-10-CM | POA: Diagnosis not present

## 2016-03-07 DIAGNOSIS — J45909 Unspecified asthma, uncomplicated: Secondary | ICD-10-CM | POA: Diagnosis not present

## 2016-03-07 DIAGNOSIS — R0789 Other chest pain: Secondary | ICD-10-CM | POA: Diagnosis not present

## 2016-03-07 DIAGNOSIS — N3001 Acute cystitis with hematuria: Secondary | ICD-10-CM | POA: Diagnosis not present

## 2016-03-07 DIAGNOSIS — E860 Dehydration: Secondary | ICD-10-CM | POA: Diagnosis not present

## 2016-03-07 DIAGNOSIS — M797 Fibromyalgia: Secondary | ICD-10-CM | POA: Diagnosis not present

## 2016-04-11 DIAGNOSIS — Z8739 Personal history of other diseases of the musculoskeletal system and connective tissue: Secondary | ICD-10-CM | POA: Diagnosis not present

## 2016-04-11 DIAGNOSIS — R569 Unspecified convulsions: Secondary | ICD-10-CM | POA: Diagnosis not present

## 2016-05-16 DIAGNOSIS — H47093 Other disorders of optic nerve, not elsewhere classified, bilateral: Secondary | ICD-10-CM | POA: Diagnosis not present

## 2016-05-16 DIAGNOSIS — H04123 Dry eye syndrome of bilateral lacrimal glands: Secondary | ICD-10-CM | POA: Diagnosis not present

## 2016-05-16 DIAGNOSIS — H35033 Hypertensive retinopathy, bilateral: Secondary | ICD-10-CM | POA: Diagnosis not present

## 2016-05-16 DIAGNOSIS — H16223 Keratoconjunctivitis sicca, not specified as Sjogren's, bilateral: Secondary | ICD-10-CM | POA: Diagnosis not present

## 2016-05-16 DIAGNOSIS — H1045 Other chronic allergic conjunctivitis: Secondary | ICD-10-CM | POA: Diagnosis not present

## 2016-05-16 DIAGNOSIS — H524 Presbyopia: Secondary | ICD-10-CM | POA: Diagnosis not present

## 2016-05-16 DIAGNOSIS — I10 Essential (primary) hypertension: Secondary | ICD-10-CM | POA: Diagnosis not present

## 2016-06-11 ENCOUNTER — Encounter: Payer: Self-pay | Admitting: Family Medicine

## 2016-06-11 ENCOUNTER — Ambulatory Visit (INDEPENDENT_AMBULATORY_CARE_PROVIDER_SITE_OTHER): Payer: Medicare Other | Admitting: Family Medicine

## 2016-06-11 VITALS — BP 136/96 | HR 92 | Temp 96.8°F | Resp 18 | Ht 64.0 in | Wt 156.1 lb

## 2016-06-11 DIAGNOSIS — J4520 Mild intermittent asthma, uncomplicated: Secondary | ICD-10-CM | POA: Insufficient documentation

## 2016-06-11 DIAGNOSIS — Z87898 Personal history of other specified conditions: Secondary | ICD-10-CM

## 2016-06-11 DIAGNOSIS — F32A Depression, unspecified: Secondary | ICD-10-CM

## 2016-06-11 DIAGNOSIS — R42 Dizziness and giddiness: Secondary | ICD-10-CM

## 2016-06-11 DIAGNOSIS — R1319 Other dysphagia: Secondary | ICD-10-CM

## 2016-06-11 DIAGNOSIS — Z7689 Persons encountering health services in other specified circumstances: Secondary | ICD-10-CM | POA: Diagnosis not present

## 2016-06-11 DIAGNOSIS — W5501XA Bitten by cat, initial encounter: Secondary | ICD-10-CM

## 2016-06-11 DIAGNOSIS — M26659 Arthropathy of unspecified temporomandibular joint: Secondary | ICD-10-CM

## 2016-06-11 DIAGNOSIS — R296 Repeated falls: Secondary | ICD-10-CM | POA: Diagnosis not present

## 2016-06-11 DIAGNOSIS — K219 Gastro-esophageal reflux disease without esophagitis: Secondary | ICD-10-CM

## 2016-06-11 DIAGNOSIS — R569 Unspecified convulsions: Secondary | ICD-10-CM

## 2016-06-11 DIAGNOSIS — M26609 Unspecified temporomandibular joint disorder, unspecified side: Secondary | ICD-10-CM

## 2016-06-11 DIAGNOSIS — F329 Major depressive disorder, single episode, unspecified: Secondary | ICD-10-CM

## 2016-06-11 DIAGNOSIS — F418 Other specified anxiety disorders: Secondary | ICD-10-CM

## 2016-06-11 DIAGNOSIS — F419 Anxiety disorder, unspecified: Secondary | ICD-10-CM

## 2016-06-11 MED ORDER — LEVOFLOXACIN 750 MG PO TABS: 750.0000 mg | ORAL_TABLET | Freq: Every day | ORAL | 0 refills | Status: DC

## 2016-06-11 MED ORDER — OMEPRAZOLE 40 MG PO CPDR: 40.0000 mg | DELAYED_RELEASE_CAPSULE | Freq: Every day | ORAL | 3 refills | Status: DC

## 2016-06-11 MED ORDER — CYCLOBENZAPRINE HCL 10 MG PO TABS: 10.0000 mg | ORAL_TABLET | Freq: Every day | ORAL | 0 refills | Status: DC

## 2016-06-11 NOTE — Progress Notes (Signed)
Chief Complaint  Patient presents with  . Establish Care   Patient is new to establish. Prior care through the health department. She now has been deemed disabled and is receiving insurance not covered by her prior provider. She is here as a new patient. I will get her old records for review. Her primary problem is a complaint of fibromyalgia. She states that it causes total body pain. She states that causes her to itch. She states it causes any problem with her mentation, and depression. She states it causes problems with her balance with frequent falls. She is taking Savella. This gives her moderate relief. She still complains of chronic unremitting daily pain. She is under the care of neurology. She has not seen them since February 2017. She also has a diagnosis of seizure disorder and is compliant with taking Keppra. Review the record shows that she actually had some syncopal episodes, all unwitnessed. She did normal EEG. She was placed on a trial of Keppra and has been on it ever since. She has never received a diagnosis of seizures. And although she has a diagnosis of fibromyalgia, she does not have typical fibromyalgia trigger points, and has not responded to usual fibromyalgia medications such as Lyrica and gabapentin. She complains of depression.  Thinks she may be bipolar after talking to one of her friends who is. Patient complains of "arthritis" all through my body. No specific joint involvements. No diagnosis of rheumatoid arthritis or inflammatory arthritis. She complains of GERD. She states she has a history of "ulcers". She has never had an endoscope. She states she does have difficulty swallowing foods and certain pills. He is on ranitidine 150 mg twice a day. I'm going to put her on omeprazole and get a GI consult.  Patient Active Problem List   Diagnosis Date Noted  . Asthma in adult, mild intermittent, uncomplicated 42/59/5638  . GERD without esophagitis 06/11/2016  . TMJ  arthropathy 06/11/2016  . Frequent falls 06/11/2016  . Vertigo 06/11/2016  . History of syncope 06/11/2016  . Fibromyalgia 07/28/2014    Outpatient Encounter Prescriptions as of 06/11/2016  Medication Sig  . albuterol (PROVENTIL HFA;VENTOLIN HFA) 108 (90 Base) MCG/ACT inhaler Inhale into the lungs every 6 (six) hours as needed for wheezing or shortness of breath.  . levETIRAcetam (KEPPRA) 500 MG tablet Take 1 tablet (500 mg total) by mouth 2 (two) times daily.  . Milnacipran (SAVELLA) 50 MG TABS tablet Take 50 mg by mouth 2 (two) times daily.  . Potassium 99 MG TABS Take 2 tablets by mouth daily.   . cyclobenzaprine (FLEXERIL) 10 MG tablet Take 1 tablet (10 mg total) by mouth at bedtime.  Marland Kitchen levofloxacin (LEVAQUIN) 750 MG tablet Take 1 tablet (750 mg total) by mouth daily.  Marland Kitchen omeprazole (PRILOSEC) 40 MG capsule Take 1 capsule (40 mg total) by mouth daily.   No facility-administered encounter medications on file as of 06/11/2016.     Past Medical History:  Diagnosis Date  . Acid reflux   . Allergy    pollen, dust  . Anxiety   . Asthma   . Carpal tunnel syndrome    bilateral  . DDD (degenerative disc disease), cervical   . Depression   . Fibromyalgia   . Hypertension   . Migraine   . Panic attacks   . Seizures ([place])   . Syncope and collapse   . Tennis elbow   . Ulcer ([place])   . Vertigo     Past  Surgical History:  Procedure Laterality Date  . BACK SURGERY  1993  . CHOLECYSTECTOMY    . GALLBLADDER SURGERY    . SHOULDER SURGERY Right   . SPINE SURGERY     lumbar disc surgery    Social History   Social History  . Marital status: Single    Spouse name: N/A  . Number of children: 1  . Years of education: HS   Occupational History  . disability     unemployed   Social History Main Topics  . Smoking status: Never Smoker  . Smokeless tobacco: Never Used  . Alcohol use Yes     Comment: drink occasionally  . Drug use: No  . Sexual activity: Not Currently   Other  Topics Concern  . Not on file   Social History Narrative   Patient is left handed.   Patient drinks very little caffeine.   Lives alone       Family History  Problem Relation Age of Onset  . COPD Mother   . Bronchitis Mother   . Arthritis Mother   . Depression Mother   . Heart disease Mother   . Hypertension Mother   . Cancer - Colon Maternal Grandmother   . Alcohol abuse Father   . Early death Father     GSW  . PKU Son     Review of Systems  Constitutional: Positive for malaise/fatigue. Negative for chills, fever and weight loss.  HENT: Negative for congestion and hearing loss.   Eyes: Negative for blurred vision and pain.  Respiratory: Positive for wheezing. Negative for cough and shortness of breath.        At times  Cardiovascular: Negative for chest pain, palpitations and leg swelling.  Gastrointestinal: Positive for heartburn. Negative for abdominal pain, constipation and diarrhea.  Genitourinary: Negative for dysuria and frequency.  Musculoskeletal: Positive for back pain, falls, joint pain, myalgias and neck pain.  Skin: Positive for itching.       Constant  Neurological: Positive for dizziness, sensory change, weakness and headaches. Negative for seizures.  Psychiatric/Behavioral: Positive for depression and memory loss. The patient is nervous/anxious and has insomnia.     BP (!) 136/96 (BP Location: Right Arm, Patient Position: Sitting, Cuff Size: Normal)   Pulse 92   Temp (!) 96.8 F (36 C) (Temporal)   Resp 18   Ht 5\' 4"  (1.626 m)   Wt 156 lb 1.3 oz (70.8 kg)   LMP 04/30/2016 (Approximate)   SpO2 98%   BMI 26.79 kg/m   Physical Exam  Constitutional: She appears well-developed and well-nourished.  Jittery.  Itchy, scratching skin frequently.  Poor eye contact.  HENT:  Head: Normocephalic and atraumatic.  Mouth/Throat: Oropharynx is clear and moist.  Eyes: Conjunctivae are normal. Pupils are equal, round, and reactive to light.  Neck: Normal range  of motion. No thyromegaly present.  Cardiovascular: Normal rate, regular rhythm and normal heart sounds.   Pulmonary/Chest: Effort normal and breath sounds normal. She has no wheezes.  Musculoskeletal: Normal range of motion. She exhibits no edema.  Lymphadenopathy:    She has no cervical adenopathy.  Neurological: She is alert. She displays normal reflexes. Coordination normal.  Skin: Rash noted. There is pallor.  Covered in small wounds from picking skin.  Right hand has wound on hypothenar eminence with cellulitis. "cat bite"  Psychiatric: Her mood appears anxious. Her affect is labile and inappropriate. Her speech is rapid and/or pressured. Thought content is not paranoid. Cognition and memory  are impaired. She expresses inappropriate judgment. She exhibits a depressed mood. She expresses no suicidal plans.  At times slowed, at times agitated and rapid speech.  Jittery.  She is inattentive.   ASSESSMENT/PLAN:  1. Seizures (HCC)/ history of syncope diagnosed incorrectly as seizures.  2. Asthma in adult, mild intermittent, uncomplicated  3. Dizziness and giddiness  4. GERD without esophagitis Trouble swallowing - Ambulatory referral to Gastroenterology  5. TMJ arthropathy History of  6. Frequent falls  7. Vertigo  8. Cat bite, initial encounter  9. Other dysphagia - Ambulatory referral to Gastroenterology  10. Anxiety and depression - Ambulatory referral to Psychiatry  11. Encounter to establish care with new doctor  Discussion: I suspect that most of this patient's symptoms from underlying mental illness and somatization that is currently untreated. Seizures and falls were unwitnessed. Her seizures are not documented with EEG. Normal MRI of brain. Unusual neurologic exam that is not organic. No real symptoms or findings to suggest fibromyalgia although patient believes this is what she has. Itching and picking skin certainly is nervous habit. I'm sending her to psychiatry  for evaluation and treatment.   Patient Instructions  Stay as active as you can manage Need records from the health department Need records about last PAP/Mammo  Stay on same medicine Add omeprazole 40 mg a day for stomach acid Add levaquin once a day for the cat bite infection Add flexeril at bedtime  Need to refer to GI for evaluation of swallowing difficulty I will refer to psychiatry  See me in a month    Raylene Everts, MD

## 2016-06-11 NOTE — Patient Instructions (Addendum)
Stay as active as you can manage Need records from the health department Need records about last PAP/Mammo  Stay on same medicine Add omeprazole 40 mg a day for stomach acid Add levaquin once a day for the cat bite infection Add flexeril at bedtime  Need to refer to GI for evaluation of swallowing difficulty I will refer to psychiatry  See me in a month

## 2016-06-12 ENCOUNTER — Encounter: Payer: Self-pay | Admitting: Internal Medicine
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
## 2016-06-17 ENCOUNTER — Ambulatory Visit (HOSPITAL_COMMUNITY): Payer: Self-pay | Admitting: Psychiatry

## 2016-06-17 ENCOUNTER — Ambulatory Visit (INDEPENDENT_AMBULATORY_CARE_PROVIDER_SITE_OTHER): Payer: Medicare Other | Admitting: Psychiatry

## 2016-06-17 VITALS — BP 130/103 | HR 101 | Resp 18 | Ht 64.0 in | Wt 162.6 lb

## 2016-06-17 DIAGNOSIS — Z882 Allergy status to sulfonamides status: Secondary | ICD-10-CM | POA: Diagnosis not present

## 2016-06-17 DIAGNOSIS — Z880 Allergy status to penicillin: Secondary | ICD-10-CM | POA: Diagnosis not present

## 2016-06-17 DIAGNOSIS — Z79899 Other long term (current) drug therapy: Secondary | ICD-10-CM | POA: Diagnosis not present

## 2016-06-17 DIAGNOSIS — Z888 Allergy status to other drugs, medicaments and biological substances status: Secondary | ICD-10-CM

## 2016-06-17 DIAGNOSIS — Z818 Family history of other mental and behavioral disorders: Secondary | ICD-10-CM

## 2016-06-17 DIAGNOSIS — F411 Generalized anxiety disorder: Secondary | ICD-10-CM | POA: Diagnosis not present

## 2016-06-17 DIAGNOSIS — Z811 Family history of alcohol abuse and dependence: Secondary | ICD-10-CM | POA: Diagnosis not present

## 2016-06-17 DIAGNOSIS — F410 Panic disorder [episodic paroxysmal anxiety] without agoraphobia: Secondary | ICD-10-CM | POA: Diagnosis not present

## 2016-06-17 MED ORDER — MIRTAZAPINE 7.5 MG PO TABS: ORAL_TABLET | ORAL | 0 refills | Status: DC

## 2016-06-17 MED ORDER — TRAZODONE HCL 50 MG PO TABS: ORAL_TABLET | ORAL | 0 refills | Status: DC

## 2016-06-17 NOTE — Patient Instructions (Addendum)
1. Start mirtazapine 7.5 mg at night for two weeks, then 15 mg at night 2 .Start Trazodone 25-50 mg at night as needed for sleep 3. Return to clinic in one month for 30 mins 4. Contact for therapy: Dr. Alford Highland Schneidmiller  [phone] [address] [place], [place] [address]

## 2016-06-25 DIAGNOSIS — H40013 Open angle with borderline findings, low risk, bilateral: Secondary | ICD-10-CM | POA: Diagnosis not present

## 2016-06-25 DIAGNOSIS — H47093 Other disorders of optic nerve, not elsewhere classified, bilateral: Secondary | ICD-10-CM | POA: Diagnosis not present

## 2016-07-03 ENCOUNTER — Other Ambulatory Visit: Payer: Self-pay

## 2016-07-03 ENCOUNTER — Ambulatory Visit (INDEPENDENT_AMBULATORY_CARE_PROVIDER_SITE_OTHER): Payer: Medicare Other | Admitting: Nurse Practitioner

## 2016-07-03 ENCOUNTER — Encounter: Payer: Self-pay | Admitting: Nurse Practitioner

## 2016-07-03 VITALS — BP 138/98 | HR 103 | Temp 97.8°F | Ht 64.0 in | Wt 166.2 lb

## 2016-07-03 DIAGNOSIS — R1084 Generalized abdominal pain: Secondary | ICD-10-CM | POA: Diagnosis not present

## 2016-07-03 DIAGNOSIS — K5900 Constipation, unspecified: Secondary | ICD-10-CM | POA: Diagnosis not present

## 2016-07-03 DIAGNOSIS — R1310 Dysphagia, unspecified: Secondary | ICD-10-CM

## 2016-07-03 DIAGNOSIS — K219 Gastro-esophageal reflux disease without esophagitis: Secondary | ICD-10-CM

## 2016-07-03 DIAGNOSIS — R109 Unspecified abdominal pain: Secondary | ICD-10-CM | POA: Insufficient documentation

## 2016-07-03 NOTE — Assessment & Plan Note (Signed)
The patient notes constipation and will sometimes go up to a week without a bowel movement. Bowel movements occasionally are "like hard little balls." Abdominal pain as per below, which tends to resolve after a good bowel movement. Query chronic idiopathic constipation versus IBS constipation type. I will trial her on Linzess 72 g once a day, on an empty stomach. We will provide samples for 2 weeks and ask her to call a progress report in 1-2 weeks. If the medication is effective we can send in a prescription to her pharmacy. Otherwise, we can titrate the dosage or change to another medication dependent on clinical response. Return for follow-up in 3 months.

## 2016-07-03 NOTE — Patient Instructions (Signed)
1. We will schedule your upper endoscopy with possible dilation. 2. Keep taking Prilosec. 3. I am giving you samples of Linzess 72 g. Take this once a day, on an empty stomach. Samples will last for 2 weeks. 4. Call us with a progress report in 1-2 weeks to let us know if the medicine is helping. 5. He may have some diarrhea initially with Linzess. This tends to resolve after about 5 days, if it occurs. 6. Return for follow-up in 3 months.

## 2016-07-03 NOTE — Assessment & Plan Note (Signed)
The patient notes dysphagia to solid foods, some pills, occasionally/rarely liquids. She has long-standing GERD. At this point we will proceed with upper endoscopy with possible dilation to further evaluate her dysphagia symptoms and dilate any narrowing that is found. Return for follow-up in 3 months.  Proceed with EGD with Dr. Gala Romney in near future: the risks, benefits, and alternatives have been discussed with the patient in detail. The patient states understanding and desires to proceed.  The patient has chronic pain and fibromyalgia, she is on Savella and trazodone. No other anticoagulants, anxiolytics, chronic pain medications, or antidepressants. We will plan for the procedure on propofol/MAC to promote adequate sedation.

## 2016-07-03 NOTE — Progress Notes (Signed)
Primary Care Physician:  Raylene Everts, MD Primary Gastroenterologist:  Dr. Gala Romney  Chief Complaint  Patient presents with  . Dysphagia    HPI:   Claire [name] is a 46 y.o. female who presents on referral from primary care for GERD and dysphagia. The patient was last seen for these reasons on 06/11/2016 during a visit to establish care. At this time she complained of GERD symptoms with a history of ulcers but never had an endoscopy. Also notes difficulty swallowing foods and certain pills, currently on ranitidine 150 mg twice a day. She was started on omeprazole and a referral to GI was initiated.  No colonoscopies or endoscopies found in our system.  Today she states she has seen Dr. Meda Coffee once so far and is very happy seeing her. Notes long-standing history of GERD since a teenager. Was diagnosed with "ulcers" when 46 years old, never had EGD before. Starting Prilosec 40 mg daily has "helped a lot." Previously had persistent symptoms, no breakthrough symptoms since starting Prilosec. Has dysphagia symptoms with solid foods, some pills, and occasionally liquids. Solid food dysphagia is essentially daily "today is the first day in a while I haven't had a problem." Notes frequent regurgitation. She also notes constipation, has gone up to a week without a stool and results in "small chunks." This is a long-standing chronic problem for her.Takes extra probiotics which tends to helps. Has not tried any other medications for her constipation. Will have lower abdominal pain when she needs to have a bowel movement which she notes is at times severe and improves with a bowel movement. Denies hematochezia, melena, unintentional weight loss, fever, chills. Denies chest pain, dyspnea, dizziness, lightheadedness, syncope, near syncope. Denies any other upper or lower GI symptoms.  Past Medical History:  Diagnosis Date  . Acid reflux   . Allergy    pollen, dust  . Anxiety   . Asthma   . Carpal  tunnel syndrome    bilateral  . DDD (degenerative disc disease), cervical   . Depression   . Fibromyalgia   . Hypertension   . Migraine   . Panic attacks   . Seizures ([place])   . Syncope and collapse   . Tennis elbow   . Ulcer   . Vertigo     Past Surgical History:  Procedure Laterality Date  . BACK SURGERY  1993  . CHOLECYSTECTOMY    . GALLBLADDER SURGERY    . SHOULDER SURGERY Right   . SPINE SURGERY     lumbar disc surgery    Current Outpatient Prescriptions  Medication Sig Dispense Refill  . albuterol (PROVENTIL HFA;VENTOLIN HFA) 108 (90 Base) MCG/ACT inhaler Inhale into the lungs every 6 (six) hours as needed for wheezing or shortness of breath.    . levETIRAcetam (KEPPRA) 500 MG tablet Take 1 tablet (500 mg total) by mouth 2 (two) times daily. 60 tablet 8  . Milnacipran (SAVELLA) 50 MG TABS tablet Take 50 mg by mouth 2 (two) times daily.    . mirtazapine (REMERON) 7.5 MG tablet 7.5 mg at night for two weeks, then 15 mg at night 60 tablet 0  . omeprazole (PRILOSEC) 40 MG capsule Take 1 capsule (40 mg total) by mouth daily. 30 capsule 3  . Potassium 99 MG TABS Take 2 tablets by mouth daily.     . traZODone (DESYREL) 50 MG tablet 25-50 mg at night as needed for sleep 30 tablet 0   No current facility-administered medications for  this visit.     Allergies as of 07/03/2016 - Review Complete 07/03/2016  Allergen Reaction Noted  . Claritin [loratadine] Other (See Comments) 07/28/2014  . Cymbalta [duloxetine hcl] Hives 07/29/2012  . Penicillins Hives 07/29/2012  . Sulfa antibiotics Hives 07/29/2012  . Ultram [tramadol] Other (See Comments) 09/12/2014    Family History  Problem Relation Age of Onset  . COPD Mother   . Bronchitis Mother   . Arthritis Mother   . Depression Mother   . Heart disease Mother   . Hypertension Mother   . Cancer - Colon Maternal Grandmother   . Alcohol abuse Father   . Early death Father     GSW  . PKU Son     Social History   Social  History  . Marital status: Single    Spouse name: N/A  . Number of children: 1  . Years of education: HS   Occupational History  . disability     unemployed   Social History Main Topics  . Smoking status: Never Smoker  . Smokeless tobacco: Never Used  . Alcohol use Yes     Comment: drink occasionally  . Drug use: No  . Sexual activity: Not Currently   Other Topics Concern  . Not on file   Social History Narrative   Patient is left handed.   Patient drinks very little caffeine.   Lives alone       Review of Systems: General: Negative for anorexia, weight loss, fever, chills, fatigue, weakness. ENT: Negative for hoarseness, difficulty swallowing. CV: Negative for chest pain, angina, palpitations, peripheral edema.  Respiratory: Negative for dyspnea at rest, cough, sputum, wheezing.  GI: See history of present illness. MS: Chronic pain and fibromyalgia.  Derm: Negative for rash or itching.  Endo: Negative for unusual weight change.  Heme: Negative for bruising or bleeding. Allergy: Negative for rash or hives.    Physical Exam: BP (!) 138/98   Pulse (!) 103   Temp 97.8 F (36.6 C) (Oral)   Ht 5\' 4"  (1.626 m)   Wt 166 lb 3.2 oz (75.4 kg)   LMP 06/24/2016   BMI 28.53 kg/m  General:   Alert and oriented. Pleasant and cooperative. Well-nourished and well-developed.  Head:  Normocephalic and atraumatic. Eyes:  Without icterus, sclera clear and conjunctiva pink.  Ears:  Normal auditory acuity. Cardiovascular:  S1, S2 present without murmurs appreciated. Extremities without clubbing or edema. Respiratory:  Clear to auscultation bilaterally. No wheezes, rales, or rhonchi. No distress.  Gastrointestinal:  +BS, soft, and non-distended. Mild to moderate TTP generalized abdomen. No HSM noted. No guarding or rebound. No masses appreciated.  Rectal:  Deferred  Musculoskalatal:  Symmetrical without gross deformities. Ambulates with a cane, hobbling gait.Marland Kitchen Neurologic:  Alert  and oriented x4;  grossly normal neurologically. Psych:  Alert and cooperative. Normal mood and affect. Heme/Lymph/Immune: No excessive bruising noted.    07/03/2016 4:02 PM   Disclaimer: This note was dictated with voice recognition software. Similar sounding words can inadvertently be transcribed and may not be corrected upon review.

## 2016-07-03 NOTE — Assessment & Plan Note (Signed)
Symptoms are significantly improved on Prilosec. Recommend continue Prilosec. Return for follow-up in 3 months.

## 2016-07-03 NOTE — Assessment & Plan Note (Signed)
Abdominal pain likely due to constipation, either chronic idiopathic constipation or IBS constipation type. Her GERD symptoms are much improved on Prilosec. She does have constipation as per above in her abdominal pain tends to improve/resolve with a good bowel movement. Further constipation management as per above. We will see if this helps her abdominal pain. Of note, she will be due for a colonoscopy within the next 2 years. Return for follow-up in 3 months.

## 2016-07-04 ENCOUNTER — Encounter: Payer: Self-pay | Admitting: Nurse Practitioner

## 2016-07-04 ENCOUNTER — Telehealth: Payer: Self-pay

## 2016-07-04 NOTE — Progress Notes (Signed)
cc'ed to pcp °

## 2016-07-04 NOTE — Telephone Encounter (Signed)
Tried to call pt, LMOVM and informed of pre-op appt 08/09/16 at 10:00am. Letter mailed.

## 2016-07-12 ENCOUNTER — Encounter: Payer: Self-pay | Admitting: Family Medicine

## 2016-07-12 ENCOUNTER — Ambulatory Visit (INDEPENDENT_AMBULATORY_CARE_PROVIDER_SITE_OTHER): Payer: Medicare Other | Admitting: Family Medicine

## 2016-07-12 VITALS — BP 136/100 | HR 88 | Temp 98.0°F | Resp 18 | Ht 64.0 in | Wt 168.1 lb

## 2016-07-12 DIAGNOSIS — F411 Generalized anxiety disorder: Secondary | ICD-10-CM

## 2016-07-12 DIAGNOSIS — M797 Fibromyalgia: Secondary | ICD-10-CM | POA: Diagnosis not present

## 2016-07-12 DIAGNOSIS — J4520 Mild intermittent asthma, uncomplicated: Secondary | ICD-10-CM | POA: Diagnosis not present

## 2016-07-12 DIAGNOSIS — K219 Gastro-esophageal reflux disease without esophagitis: Secondary | ICD-10-CM

## 2016-07-12 MED ORDER — TIZANIDINE HCL 6 MG PO CAPS: 6.0000 mg | ORAL_CAPSULE | Freq: Three times a day (TID) | ORAL | 0 refills | Status: DC | PRN

## 2016-07-12 NOTE — Patient Instructions (Signed)
Need to call again about the old records  Take the tizanidine as needed muscle relaxers  Continue to see about pain management  Stay as active as you can manage  See me in a couple of months

## 2016-07-12 NOTE — Progress Notes (Signed)
Chief Complaint  Patient presents with  . Follow-up    1 month   Routine follow up No change In status requests muscle relaxer Saw GI and has EGD scheduled Saw psychiatry and is on new medicine Wants referral for chronic pain, she complains of chronic wide spread pain in neck, back and extremities and "fibromyalgia" but exact diagnosis unclear to me.  Patient Active Problem List   Diagnosis Date Noted  . Dysphagia 07/03/2016  . Constipation 07/03/2016  . Abdominal pain 07/03/2016  . Generalized anxiety disorder 06/17/2016  . Panic disorder 06/17/2016  . Asthma in adult, mild intermittent, uncomplicated 30/09/6224  . GERD without esophagitis 06/11/2016  . 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 Frequent falls 06/11/2016  . Vertigo 06/11/2016  . History of syncope 06/11/2016  . Fibromyalgia 07/28/2014    Outpatient Encounter Prescriptions as of 07/12/2016  Medication Sig  . albuterol (PROVENTIL HFA;VENTOLIN HFA) 108 (90 Base) MCG/ACT inhaler Inhale into the lungs every 6 (six) hours as needed for wheezing or shortness of breath.  . levETIRAcetam (KEPPRA) 500 MG tablet Take 1 tablet (500 mg total) by mouth 2 (two) times daily.  . Milnacipran (SAVELLA) 50 MG TABS tablet Take 50 mg by mouth 2 (two) times daily.  . mirtazapine (REMERON) 7.5 MG tablet 7.5 mg at night for two weeks, then 15 mg at night  . omeprazole (PRILOSEC) 40 MG capsule Take 1 capsule (40 mg total) by mouth daily.  . Potassium 99 MG TABS Take 2 tablets by mouth daily.   . traZODone (DESYREL) 50 MG tablet 25-50 mg at night as needed for sleep  . tizanidine (ZANAFLEX) 6 MG capsule Take 1 capsule (6 mg total) by mouth 3 (three) times daily as needed for muscle spasms.   No facility-administered encounter medications on file as of 07/12/2016.     Allergies  Allergen Reactions  . Claritin [Loratadine] Other (See Comments)    shaking  . Cymbalta [Duloxetine Hcl] Hives    "Hives, forgot who I was"  . Penicillins Hives    . Sulfa Antibiotics Hives  . Ultram [Tramadol] Other (See Comments)    Dizzy    Review of Systems  Constitutional: Positive for fatigue. Negative for unexpected weight change.  Eyes: Positive for visual disturbance.  Respiratory: Positive for wheezing. Negative for cough and shortness of breath.   Cardiovascular: Negative for chest pain and palpitations.  Gastrointestinal: Negative for constipation and diarrhea.  Musculoskeletal: Positive for arthralgias, back pain, myalgias, neck pain and neck stiffness.  Neurological: Negative for dizziness and headaches.  Psychiatric/Behavioral: Positive for dysphoric mood and sleep disturbance. The patient is not nervous/anxious.     BP (!) 136/100 (BP Location: Right Arm, Patient Position: Sitting, Cuff Size: Normal)   Pulse 88   Temp 98 F (36.7 C) (Temporal)   Resp 18   Ht 5\' 4"  (1.626 m)   Wt 168 lb 1.3 oz (76.2 kg)   LMP 06/24/2016   SpO2 98%   BMI 28.85 kg/m   Physical Exam  Constitutional: She is oriented to person, place, and time. She appears well-developed and well-nourished.  Stiff and guarded.  Pulls away from even light touch to shoulder  HENT:  Head: Normocephalic and atraumatic.  Mouth/Throat: Oropharynx is clear and moist.  Cardiovascular: Normal rate, regular rhythm and normal heart sounds.   Pulmonary/Chest: Effort normal and breath sounds normal. She has no wheezes.  Neurological: She is alert and oriented to person, place, and time.  Psychiatric: She has a normal  mood and affect. Her behavior is normal.  Bland affect    ASSESSMENT/PLAN:  Asthma in adult, mild intermittent, uncomplicated  GERD without esophagitis  Generalized anxiety disorder  Fibromyalgia    Patient Instructions  Need to call again about the old records  Take the tizanidine as needed muscle relaxers  Continue to see about pain management  Stay as active as you can manage  See me in a couple of months   Raylene Everts, MD

## 2016-07-16 ENCOUNTER — Other Ambulatory Visit: Payer: Self-pay

## 2016-07-17 ENCOUNTER — Telehealth: Payer: Self-pay | Admitting: Internal Medicine

## 2016-07-17 ENCOUNTER — Ambulatory Visit (HOSPITAL_COMMUNITY): Payer: Self-pay | Admitting: Psychiatry

## 2016-07-17 NOTE — Telephone Encounter (Signed)
Pt has used her Linzess samples and they are working for her and needs a prescription called into [facility]

## 2016-07-17 NOTE — Telephone Encounter (Signed)
Routing to EG 

## 2016-07-18 ENCOUNTER — Ambulatory Visit (INDEPENDENT_AMBULATORY_CARE_PROVIDER_SITE_OTHER): Payer: Medicare Other | Admitting: Psychiatry

## 2016-07-18 ENCOUNTER — Encounter (HOSPITAL_COMMUNITY): Payer: Self-pay | Admitting: Psychiatry

## 2016-07-18 ENCOUNTER — Other Ambulatory Visit: Payer: Self-pay

## 2016-07-18 VITALS — BP 135/103 | HR 96 | Ht 64.0 in | Wt 167.0 lb

## 2016-07-18 DIAGNOSIS — Z811 Family history of alcohol abuse and dependence: Secondary | ICD-10-CM

## 2016-07-18 DIAGNOSIS — F411 Generalized anxiety disorder: Secondary | ICD-10-CM | POA: Diagnosis not present

## 2016-07-18 DIAGNOSIS — G4700 Insomnia, unspecified: Secondary | ICD-10-CM | POA: Diagnosis not present

## 2016-07-18 DIAGNOSIS — Z818 Family history of other mental and behavioral disorders: Secondary | ICD-10-CM | POA: Diagnosis not present

## 2016-07-18 DIAGNOSIS — Z79899 Other long term (current) drug therapy: Secondary | ICD-10-CM | POA: Diagnosis not present

## 2016-07-18 DIAGNOSIS — F410 Panic disorder [episodic paroxysmal anxiety] without agoraphobia: Secondary | ICD-10-CM

## 2016-07-18 MED ORDER — LINACLOTIDE 72 MCG PO CAPS: 72.0000 ug | ORAL_CAPSULE | Freq: Every day | ORAL | 2 refills | Status: DC

## 2016-07-18 MED ORDER — MIRTAZAPINE 30 MG PO TABS: 30.0000 mg | ORAL_TABLET | Freq: Every day | ORAL | 1 refills | Status: DC

## 2016-07-18 MED ORDER — CYCLOBENZAPRINE HCL 10 MG PO TABS: 10.0000 mg | ORAL_TABLET | Freq: Three times a day (TID) | ORAL | 0 refills | Status: DC | PRN

## 2016-07-18 MED ORDER — TRAZODONE HCL 50 MG PO TABS: 50.0000 mg | ORAL_TABLET | Freq: Every evening | ORAL | 1 refills | Status: DC | PRN

## 2016-07-18 NOTE — Addendum Note (Signed)
Addended by: Gordy Levan, Julieana Eshleman A on: 07/18/2016 01:08 PM   Modules accepted: Orders

## 2016-07-18 NOTE — Telephone Encounter (Signed)
Please tell the patient Rx sent to pharmacy for 90 day supply. If she has Pharmacist, community she can stop by our office and pick-up a copay card to help reduce her copay. Call with questions or concerns.

## 2016-07-18 NOTE — Patient Instructions (Signed)
1. Increase mirtazapine 30 mg at night 2. Continue Trazodone 50 mg at night as needed for sleep 3. Return to clinic in one month
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
## 2016-07-22 NOTE — Telephone Encounter (Signed)
Pt has medicare and cannot use a copay card.

## 2016-08-05 ENCOUNTER — Telehealth: Payer: Self-pay | Admitting: Family Medicine

## 2016-08-05 NOTE — Telephone Encounter (Signed)
Faxed referral to [facility]

## 2016-08-07 NOTE — Patient Instructions (Signed)
Claire [name]  08/07/2016     @PREFPERIOPPHARMACY @   Your procedure is scheduled on  08/14/2016   Report to [facility] at  1045  A.M.  Call this number if you have problems the morning of surgery:  [phone]   Remember:  Do not eat food or drink liquids after midnight.  Take these medicines the morning of surgery with A SIP OF WATER  Flexaril, benadryl, keppra, savella, prilosec.   Do not wear jewelry, make-up or nail polish.  Do not wear lotions, powders, or perfumes, or deoderant.  Do not shave 48 hours prior to surgery.  Men may shave face and neck.  Do not bring valuables to the hospital.  [facility] is not responsible for any belongings or valuables.  Contacts, dentures or bridgework may not be worn into surgery.  Leave your suitcase in the car.  After surgery it may be brought to your room.  For patients admitted to the hospital, discharge time will be determined by your treatment team.  Patients discharged the day of surgery will not be allowed to drive home.   Name and phone number of your driver:   family Special instructions:  Follow the diet instructions given to you by Dr Roseanne Kaufman office.  Please read over the following fact sheets that you were given. Anesthesia Post-op Instructions and Care and Recovery After Surgery       Esophagogastroduodenoscopy Esophagogastroduodenoscopy (EGD) is a procedure to examine the lining of the esophagus, stomach, and first part of the small intestine (duodenum). This procedure is done to check for problems such as inflammation, bleeding, ulcers, or growths. During this procedure, a long, flexible, lighted tube with a camera attached (endoscope) is inserted down the throat. Tell a health care provider about:  Any allergies you have.  All medicines you are taking, including vitamins, herbs, eye drops, creams, and over-the-counter medicines.  Any problems you or family members have had with anesthetic  medicines.  Any blood disorders you have.  Any surgeries you have had.  Any medical conditions you have.  Whether you are pregnant or may be pregnant. What are the risks? Generally, this is a safe procedure. However, problems may occur, including:  Infection.  Bleeding.  A tear (perforation) in the esophagus, stomach, or duodenum.  Trouble breathing.  Excessive sweating.  Spasms of the larynx.  A slowed heartbeat.  Low blood pressure. What happens before the procedure?  Follow instructions from your health care provider about eating or drinking restrictions.  Ask your health care provider about:  Changing or stopping your regular medicines. This is especially important if you are taking diabetes medicines or blood thinners.  Taking medicines such as aspirin and ibuprofen. These medicines can thin your blood. Do not take these medicines before your procedure if your health care provider instructs you not to.  Plan to have someone take you home after the procedure.  If you wear dentures, be ready to remove them before the procedure. What happens during the procedure?  To reduce your risk of infection, your health care team will wash or sanitize their hands.  An IV tube will be put in a vein in your hand or arm. You will get medicines and fluids through this tube.  You will be given one or more of the following:  A medicine to help you relax (sedative).  A medicine to numb the area (local anesthetic). This medicine  may be sprayed into your throat. It will make you feel more comfortable and keep you from gagging or coughing during the procedure.  A medicine for pain.  A mouth guard may be placed in your mouth to protect your teeth and to keep you from biting on the endoscope.  You will be asked to lie on your left side.  The endoscope will be lowered down your throat into your esophagus, stomach, and duodenum.  Air will be put into the endoscope. This will help  your health care provider see better.  The lining of your esophagus, stomach, and duodenum will be examined.  Your health care provider may:  Take a tissue sample so it can be looked at in a lab (biopsy).  Remove growths.  Remove objects (foreign bodies) that are stuck.  Treat any bleeding with medicines or other devices that stop tissue from bleeding.  Widen (dilate) or stretch narrowed areas of your esophagus and stomach.  The endoscope will be taken out. The procedure may vary among health care providers and hospitals. What happens after the procedure?  Your blood pressure, heart rate, breathing rate, and blood oxygen level will be monitored often until the medicines you were given have worn off.  Do not eat or drink anything until the numbing medicine has worn off and your gag reflex has returned. This information is not intended to replace advice given to you by your health care provider. Make sure you discuss any questions you have with your health care provider. Document Released: 07/05/2004 Document Revised: 08/10/2015 Document Reviewed: 01/26/2015 Elsevier Interactive Patient Education  2017 [place]. Esophagogastroduodenoscopy, Care After Refer to this sheet in the next few weeks. These instructions provide you with information about caring for yourself after your procedure. Your health care provider may also give you more specific instructions. Your treatment has been planned according to current medical practices, but problems sometimes occur. Call your health care provider if you have any problems or questions after your procedure. What can I expect after the procedure? After the procedure, it is common to have:  A sore throat.  Nausea.  Bloating.  Dizziness.  Fatigue. Follow these instructions at home:  Do not eat or drink anything until the numbing medicine (local anesthetic) has worn off and your gag reflex has returned. You will know that the local  anesthetic has worn off when you can swallow comfortably.  Do not drive for 24 hours if you received a medicine to help you relax (sedative).  If your health care provider took a tissue sample for testing during the procedure, make sure to get your test results. This is your responsibility. Ask your health care provider or the department performing the test when your results will be ready.  Keep all follow-up visits as told by your health care provider. This is important. Contact a health care provider if:  You cannot stop coughing.  You are not urinating.  You are urinating less than usual. Get help right away if:  You have trouble swallowing.  You cannot eat or drink.  You have throat or chest pain that gets worse.  You are dizzy or light-headed.  You faint.  You have nausea or vomiting.  You have chills.  You have a fever.  You have severe abdominal pain.  You have black, tarry, or bloody stools. This information is not intended to replace advice given to you by your health care provider. Make sure you discuss any questions you have with  your health care provider. Document Released: 02/19/2012 Document Revised: 08/10/2015 Document Reviewed: 01/26/2015 Elsevier Interactive Patient Education  2017 [employer]  Esophageal Dilatation Esophageal dilatation is a procedure to open a blocked or narrowed part of the esophagus. The esophagus is the long tube in your throat that carries food and liquid from your mouth to your stomach. The procedure is also called esophageal dilation. You may need this procedure if you have a buildup of scar tissue in your esophagus that makes it difficult, painful, or even impossible to swallow. This can be caused by gastroesophageal reflux disease (GERD). In rare cases, people need this procedure because they have cancer of the esophagus or a problem with the way food moves through the esophagus. Sometimes you may need to have another dilatation  to enlarge the opening of the esophagus gradually. Tell a health care provider about:  Any allergies you have.  All medicines you are taking, including vitamins, herbs, eye drops, creams, and over-the-counter medicines.  Any problems you or family members have had with anesthetic medicines.  Any blood disorders you have.  Any surgeries you have had.  Any medical conditions you have.  Any antibiotic medicines you are required to take before dental procedures. What are the risks? Generally, this is a safe procedure. However, problems can occur and include:  Bleeding from a tear in the lining of the esophagus.  A hole (perforation) in the esophagus. What happens before the procedure?  Do not eat or drink anything after midnight on the night before the procedure or as directed by your health care provider.  Ask your health care provider about changing or stopping your regular medicines. This is especially important if you are taking diabetes medicines or blood thinners.  Plan to have someone take you home after the procedure. What happens during the procedure?  You will be given a medicine that makes you relaxed and sleepy (sedative).  A medicine may be sprayed or gargled to numb the back of the throat.  Your health care provider can use various instruments to do an esophageal dilatation. During the procedure, the instrument used will be placed in your mouth and passed down into your esophagus. Options include:  Simple dilators. This instrument is carefully placed in the esophagus to stretch it.  Guided wire bougies. In this method, a flexible tube (endoscope) is used to insert a wire into the esophagus. The dilator is passed over this wire to enlarge the esophagus. Then the wire is removed.  Balloon dilators. An endoscope with a small balloon at the end is passed down into the esophagus. Inflating the balloon gently stretches the esophagus and opens it up. What happens after  the procedure?  Your blood pressure, heart rate, breathing rate, and blood oxygen level will be monitored often until the medicines you were given have worn off.  Your throat may feel slightly sore and will probably still feel numb. This will improve slowly over time.  You will not be allowed to eat or drink until the throat numbness has resolved.  If this is a same-day procedure, you may be allowed to go home once you have been able to drink, urinate, and sit on the edge of the bed without nausea or dizziness.  If this is a same-day procedure, you should have a friend or family member with you for the next 24 hours after the procedure. This information is not intended to replace advice given to you by your health care provider. Make sure  you discuss any questions you have with your health care provider. Document Released: 04/25/2005 Document Revised: 08/10/2015 Document Reviewed: 07/14/2013 Elsevier Interactive Patient Education  2017 [place] Anesthesia is a term that refers to techniques, procedures, and medicines that help a person stay safe and comfortable during a medical procedure. Monitored anesthesia care, or sedation, is one type of anesthesia. Your anesthesia specialist may recommend sedation if you will be having a procedure that does not require you to be unconscious, such as:  Cataract surgery.  A dental procedure.  A biopsy.  A colonoscopy. During the procedure, you may receive a medicine to help you relax (sedative). There are three levels of sedation:  Mild sedation. At this level, you may feel awake and relaxed. You will be able to follow directions.  Moderate sedation. At this level, you will be sleepy. You may not remember the procedure.  Deep sedation. At this level, you will be asleep. You will not remember the procedure. The more medicine you are given, the deeper your level of sedation will be. Depending on how you respond to  the procedure, the anesthesia specialist may change your level of sedation or the type of anesthesia to fit your needs. An anesthesia specialist will monitor you closely during the procedure. Let your health care provider know about:  Any allergies you have.  All medicines you are taking, including vitamins, herbs, eye drops, creams, and over-the-counter medicines.  Any use of steroids (by mouth or as a cream).  Any problems you or family members have had with sedatives and anesthetic medicines.  Any blood disorders you have.  Any surgeries you have had.  Any medical conditions you have, such as sleep apnea.  Whether you are pregnant or may be pregnant.  Any use of cigarettes, alcohol, or street drugs. What are the risks? Generally, this is a safe procedure. However, problems may occur, including:  Getting too much medicine (oversedation).  Nausea.  Allergic reaction to medicines.  Trouble breathing. If this happens, a breathing tube may be used to help with breathing. It will be removed when you are awake and breathing on your own.  Heart trouble.  Lung trouble. Before the procedure Staying hydrated  Follow instructions from your health care provider about hydration, which may include:  Up to 2 hours before the procedure - you may continue to drink clear liquids, such as water, clear fruit juice, black coffee, and plain tea. Eating and drinking restrictions  Follow instructions from your health care provider about eating and drinking, which may include:  8 hours before the procedure - stop eating heavy meals or foods such as meat, fried foods, or fatty foods.  6 hours before the procedure - stop eating light meals or foods, such as toast or cereal.  6 hours before the procedure - stop drinking milk or drinks that contain milk.  2 hours before the procedure - stop drinking clear liquids. Medicines  Ask your health care provider about:  Changing or stopping your  regular medicines. This is especially important if you are taking diabetes medicines or blood thinners.  Taking medicines such as aspirin and ibuprofen. These medicines can thin your blood. Do not take these medicines before your procedure if your health care provider instructs you not to. Tests and exams  You will have a physical exam.  You may have blood tests done to show:  How well your kidneys and liver are working.  How well your blood can  clot.  General instructions  Plan to have someone take you home from the hospital or clinic.  If you will be going home right after the procedure, plan to have someone with you for 24 hours. What happens during the procedure?  Your blood pressure, heart rate, breathing, level of pain and overall condition will be monitored.  An IV tube will be inserted into one of your veins.  Your anesthesia specialist will give you medicines as needed to keep you comfortable during the procedure. This may mean changing the level of sedation.  The procedure will be performed. After the procedure  Your blood pressure, heart rate, breathing rate, and blood oxygen level will be monitored until the medicines you were given have worn off.  Do not drive for 24 hours if you received a sedative.  You may:  Feel sleepy, clumsy, or nauseous.  Feel forgetful about what happened after the procedure.  Have a sore throat if you had a breathing tube during the procedure.  Vomit. This information is not intended to replace advice given to you by your health care provider. Make sure you discuss any questions you have with your health care provider. Document Released: 11/28/2004 Document Revised: 08/11/2015 Document Reviewed: 06/25/2015 Elsevier Interactive Patient Education  2017 [place], Care After These instructions provide you with information about caring for yourself after your procedure. Your health care provider may also  give you more specific instructions. Your treatment has been planned according to current medical practices, but problems sometimes occur. Call your health care provider if you have any problems or questions after your procedure. What can I expect after the procedure? After your procedure, it is common to:  Feel sleepy for several hours.  Feel clumsy and have poor balance for several hours.  Feel forgetful about what happened after the procedure.  Have poor judgment for several hours.  Feel nauseous or vomit.  Have a sore throat if you had a breathing tube during the procedure. Follow these instructions at home: For at least 24 hours after the procedure:    Do not:  Participate in activities in which you could fall or become injured.  Drive.  Use heavy machinery.  Drink alcohol.  Take sleeping pills or medicines that cause drowsiness.  Make important decisions or sign legal documents.  Take care of children on your own.  Rest. Eating and drinking   Follow the diet that is recommended by your health care provider.  If you vomit, drink water, juice, or soup when you can drink without vomiting.  Make sure you have little or no nausea before eating solid foods. General instructions   Have a responsible adult stay with you until you are awake and alert.  Take over-the-counter and prescription medicines only as told by your health care provider.  If you smoke, do not smoke without supervision.  Keep all follow-up visits as told by your health care provider. This is important. Contact a health care provider if:  You keep feeling nauseous or you keep vomiting.  You feel light-headed.  You develop a rash.  You have a fever. Get help right away if:  You have trouble breathing. This information is not intended to replace advice given to you by your health care provider. Make sure you discuss any questions you have with your health care provider. Document  Released: 06/25/2015 Document Revised: 10/25/2015 Document Reviewed: 06/25/2015 Elsevier Interactive Patient Education  2017 [employer].

## 2016-08-08 ENCOUNTER — Telehealth: Payer: Self-pay | Admitting: Family Medicine

## 2016-08-08 NOTE — Telephone Encounter (Signed)
Patient is scheduled with [facility] 08/15/16 w/ Dr.Bodea per fax

## 2016-08-09 ENCOUNTER — Other Ambulatory Visit: Payer: Self-pay

## 2016-08-09 ENCOUNTER — Encounter (HOSPITAL_COMMUNITY): Payer: Self-pay

## 2016-08-09 ENCOUNTER — Encounter (HOSPITAL_COMMUNITY)
Admission: RE | Admit: 2016-08-09 | Discharge: 2016-08-09 | Disposition: A | Discharge status: Home / Self Care | Payer: Medicare Other | Source: Ambulatory Visit | Attending: Internal Medicine | Admitting: Internal Medicine

## 2016-08-09 DIAGNOSIS — R9431 Abnormal electrocardiogram [ECG] [EKG]: Secondary | ICD-10-CM | POA: Diagnosis not present

## 2016-08-09 DIAGNOSIS — K219 Gastro-esophageal reflux disease without esophagitis: Secondary | ICD-10-CM | POA: Insufficient documentation

## 2016-08-09 DIAGNOSIS — Z01812 Encounter for preprocedural laboratory examination: Secondary | ICD-10-CM | POA: Insufficient documentation

## 2016-08-09 DIAGNOSIS — R1310 Dysphagia, unspecified: Secondary | ICD-10-CM | POA: Diagnosis not present

## 2016-08-09 DIAGNOSIS — R000 Tachycardia, unspecified: Secondary | ICD-10-CM | POA: Diagnosis not present

## 2016-08-09 DIAGNOSIS — Z01818 Encounter for other preprocedural examination: Secondary | ICD-10-CM | POA: Insufficient documentation

## 2016-08-09 HISTORY — DX: Palpitations: R00.2

## 2016-08-09 LAB — CBC WITH DIFFERENTIAL/PLATELET
Basophils Absolute: 0.1 10*3/uL (ref 0.0–0.1)
Basophils Relative: 1 %
Eosinophils Absolute: 1.1 10*3/uL — ABNORMAL HIGH (ref 0.0–0.7)
Eosinophils Relative: 17 %
HCT: 41.5 % (ref 36.0–46.0)
Hemoglobin: 14.6 g/dL (ref 12.0–15.0)
Lymphocytes Relative: 31 %
Lymphs Abs: 2.1 10*3/uL (ref 0.7–4.0)
MCH: 33.9 pg (ref 26.0–34.0)
MCHC: 35.2 g/dL (ref 30.0–36.0)
MCV: 96.3 fL (ref 78.0–100.0)
Monocytes Absolute: 0.5 10*3/uL (ref 0.1–1.0)
Monocytes Relative: 8 %
Neutro Abs: 3.1 10*3/uL (ref 1.7–7.7)
Neutrophils Relative %: 43 %
Platelets: 325 10*3/uL (ref 150–400)
RBC: 4.31 MIL/uL (ref 3.87–5.11)
RDW: 12.3 % (ref 11.5–15.5)
WBC: 6.9 10*3/uL (ref 4.0–10.5)

## 2016-08-09 LAB — BASIC METABOLIC PANEL
Anion gap: 8 (ref 5–15)
BUN: 16 mg/dL (ref 6–20)
CO2: 28 mmol/L (ref 22–32)
Calcium: 9.3 mg/dL (ref 8.9–10.3)
Chloride: 101 mmol/L (ref 101–111)
Creatinine, Ser: 0.84 mg/dL (ref 0.44–1.00)
GFR calc Af Amer: >60 mL/min (ref >60)
GFR calc non Af Amer: >60 mL/min (ref >60)
Glucose, Bld: 111 mg/dL — ABNORMAL HIGH (ref 65–99)
Potassium: 3.6 mmol/L (ref 3.5–5.1)
Sodium: 137 mmol/L (ref 135–145)

## 2016-08-09 LAB — HCG, SERUM, QUALITATIVE: Preg, Serum: POSITIVE — AB

## 2016-08-09 NOTE — Progress Notes (Signed)
   08/09/16 0954  OBSTRUCTIVE SLEEP APNEA  Have you ever been diagnosed with sleep apnea through a sleep study? No  Do you snore loudly (loud enough to be heard through closed doors)?  1  Do you often feel tired, fatigued, or sleepy during the daytime (such as falling asleep during driving or talking to someone)? 1  Has anyone observed you stop breathing during your sleep? 1  Do you have, or are you being treated for high blood pressure? 1  BMI more than 35 kg/m2? 0  Age > 50 (1-yes) 0  Neck circumference greater than:Female 16 inches or larger, Female 17inches or larger? 0  Female Gender (Yes=1) 0  Obstructive Sleep Apnea Score 4  Score 5 or greater  Results sent to PCP

## 2016-08-14 ENCOUNTER — Encounter: Payer: Self-pay | Admitting: Internal Medicine

## 2016-08-14 ENCOUNTER — Encounter: Payer: Self-pay | Admitting: Family Medicine

## 2016-08-14 ENCOUNTER — Ambulatory Visit (INDEPENDENT_AMBULATORY_CARE_PROVIDER_SITE_OTHER): Payer: Medicare Other | Admitting: Family Medicine

## 2016-08-14 ENCOUNTER — Encounter (HOSPITAL_COMMUNITY)
Admission: RE | Disposition: A | Discharge status: Home / Self Care | Payer: Self-pay | Source: Ambulatory Visit | Attending: Internal Medicine

## 2016-08-14 ENCOUNTER — Ambulatory Visit (HOSPITAL_COMMUNITY)
Admission: RE | Admit: 2016-08-14 | Discharge: 2016-08-14 | Disposition: A | Discharge status: Home / Self Care | Payer: Medicare Other | Source: Ambulatory Visit | Attending: Internal Medicine | Admitting: Internal Medicine

## 2016-08-14 VITALS — HR 100 | Temp 98.3°F | Resp 18 | Ht 64.0 in | Wt 168.0 lb

## 2016-08-14 DIAGNOSIS — R1310 Dysphagia, unspecified: Secondary | ICD-10-CM

## 2016-08-14 DIAGNOSIS — L237 Allergic contact dermatitis due to plants, except food: Secondary | ICD-10-CM | POA: Diagnosis not present

## 2016-08-14 DIAGNOSIS — L255 Unspecified contact dermatitis due to plants, except food: Secondary | ICD-10-CM

## 2016-08-14 DIAGNOSIS — K219 Gastro-esophageal reflux disease without esophagitis: Secondary | ICD-10-CM

## 2016-08-14 SURGERY — ESOPHAGOGASTRODUODENOSCOPY (EGD) WITH PROPOFOL
Anesthesia: Monitor Anesthesia Care

## 2016-08-14 MED ORDER — METHYLPREDNISOLONE 4 MG PO TBPK
ORAL_TABLET | ORAL | 0 refills | Status: DC
Start: 2016-08-14 — End: 2016-08-30

## 2016-08-14 MED ORDER — CHLORHEXIDINE GLUCONATE CLOTH 2 % EX PADS: 6.0000 | MEDICATED_PAD | Freq: Once | CUTANEOUS | Status: DC

## 2016-08-14 MED ORDER — HYDROXYZINE HCL 25 MG PO TABS: 25.0000 mg | ORAL_TABLET | Freq: Three times a day (TID) | ORAL | 0 refills | Status: DC | PRN

## 2016-08-14 MED ORDER — TRIAMCINOLONE ACETONIDE 0.1 % EX CREA: 1.0000 "application " | TOPICAL_CREAM | Freq: Two times a day (BID) | CUTANEOUS | 0 refills | Status: DC

## 2016-08-14 NOTE — Progress Notes (Signed)
APPOINTMENT MADE AND CALLED PATIENT AND LEFT MESSAGE ON CELL

## 2016-08-14 NOTE — Progress Notes (Signed)
Claire [name], can you schedule a follow-up appt with EG in two weeks, please--use an urgent appt slot if needed

## 2016-08-14 NOTE — Progress Notes (Signed)
Patient has an active outbreak of poison oak with blisters noted on bilateral arms. Dr. Patsey Berthold aware and would like patient to be seen for treatment. Endoscopy procedure today to be cancelled. Dr. Moshe Cipro notified and will see her @ 1120.

## 2016-08-14 NOTE — Progress Notes (Signed)
Chief Complaint  Patient presents with  . Rash    x 24 hours   Was out doing yard work and exposed to poison ivy/oak now with blistering rash on both arms Took benadryl but still itches No fever   Patient Active Problem List   Diagnosis Date Noted  . Dysphagia 07/03/2016  . Constipation 07/03/2016  . Abdominal pain 07/03/2016  . Generalized anxiety disorder 06/17/2016  . Panic disorder 06/17/2016  . Asthma in adult, mild intermittent, uncomplicated 70/48/8891  . GERD without esophagitis 06/11/2016  . 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 Frequent falls 06/11/2016  . Vertigo 06/11/2016  . History of syncope 06/11/2016  . Fibromyalgia 07/28/2014    Outpatient Encounter Prescriptions as of 08/14/2016  Medication Sig  . albuterol (PROVENTIL HFA;VENTOLIN HFA) 108 (90 Base) MCG/ACT inhaler Inhale 1-2 puffs into the lungs every 6 (six) hours as needed for wheezing or shortness of breath.   . Aspirin-Caffeine (BC FAST PAIN RELIEF ARTHRITIS PO) Take 1 packet by mouth daily as needed (headaches).  . cyclobenzaprine (FLEXERIL) 10 MG tablet Take 1 tablet (10 mg total) by mouth 3 (three) times daily as needed for muscle spasms.  . diphenhydrAMINE (BENADRYL) 25 MG tablet Take 50 mg by mouth 2 (two) times daily as needed for allergies.  Marland Kitchen levETIRAcetam (KEPPRA) 500 MG tablet Take 1 tablet (500 mg total) by mouth 2 (two) times daily.  Marland Kitchen linaclotide (LINZESS) 72 MCG capsule Take 1 capsule (72 mcg total) by mouth daily before breakfast.  . Milnacipran (SAVELLA) 50 MG TABS tablet Take 50 mg by mouth 2 (two) times daily.  . mirtazapine (REMERON) 30 MG tablet Take 1 tablet (30 mg total) by mouth at bedtime.  . Multiple Vitamin (MULTIVITAMIN WITH MINERALS) TABS tablet Take 1 tablet by mouth daily.  Marland Kitchen omeprazole (PRILOSEC) 40 MG capsule Take 1 capsule (40 mg total) by mouth daily.  . Polyvinyl Alcohol-Povidone (REFRESH OP) Apply 2 drops to eye daily as needed (dry eyes).  . Potassium 99 MG TABS Take 99  mg by mouth daily as needed (cramps).   . Probiotic CAPS Take 1 capsule by mouth daily.  . traZODone (DESYREL) 50 MG tablet Take 1 tablet (50 mg total) by mouth at bedtime as needed for sleep.  . hydrOXYzine (ATARAX/VISTARIL) 25 MG tablet Take 1 tablet (25 mg total) by mouth 3 (three) times daily as needed.  . methylPREDNISolone (MEDROL DOSEPAK) 4 MG TBPK tablet tad  . triamcinolone cream (KENALOG) 0.1 % Apply 1 application topically 2 (two) times daily.  . [DISCONTINUED] Chlorhexidine Gluconate Cloth 2 % PADS 6 each   . [DISCONTINUED] Chlorhexidine Gluconate Cloth 2 % PADS 6 each    No facility-administered encounter medications on file as of 08/14/2016.     Allergies  Allergen Reactions  . Claritin [Loratadine] Other (See Comments)    shaking  . Cymbalta [Duloxetine Hcl] Hives    "Hives, forgot who I was"  . Penicillins Hives    Has patient had a PCN reaction causing immediate rash, facial/tongue/throat swelling, SOB or lightheadedness with hypotension: Unknown Has patient had a PCN reaction causing severe rash involving mucus membranes or skin necrosis: Yes Has patient had a PCN reaction that required hospitalization: Unknown Has patient had a PCN reaction occurring within the last 10 years: Unknown If all of the above answers are "NO", then may proceed with Cephalosporin use.  . Sulfa Antibiotics Hives  . Ultram [Tramadol] Other (See Comments)    Dizzy    Review of Systems  Constitutional: Negative for activity change, appetite change and fever.  Musculoskeletal: Positive for arthralgias and back pain.       Chronic  Skin: Positive for rash.  Psychiatric/Behavioral: Positive for dysphoric mood. The patient is nervous/anxious.        Chronic  All other systems reviewed and are negative.    Pulse 100   Temp 98.3 F (36.8 C) (Temporal)   Resp 18   Ht 5\' 4"  (1.626 m)   Wt 168 lb 0.6 oz (76.2 kg)   SpO2 100%   BMI 28.84 kg/m   Physical Exam  Constitutional: She is  oriented to person, place, and time. She appears well-developed and well-nourished. No distress.  HENT:  Head: Normocephalic and atraumatic.  Mouth/Throat: Oropharynx is clear and moist.  Eyes: Conjunctivae are normal. Pupils are equal, round, and reactive to light.  Neck: Normal range of motion. No thyromegaly present.  Cardiovascular: Normal rate, regular rhythm and normal heart sounds.   Pulmonary/Chest: Effort normal and breath sounds normal. She has no wheezes.  Lymphadenopathy:    She has no cervical adenopathy.  Neurological: She is alert and oriented to person, place, and time.  Skin:  patches of erythematous macules with vesicles, some weeping both arms  Psychiatric: She has a normal mood and affect. Her behavior is normal.    ASSESSMENT/PLAN:  1. Rhus dermatitis    Patient Instructions  Take the medrol pak Take all of day one today  Use the hydroxyzine for itching Use the cream 2-3 times a day  Call for problems   Raylene Everts, MD
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
## 2016-08-14 NOTE — Progress Notes (Unsigned)
Patient presents to short stay for an EGD today. Patient developed a dermatitis involving both shoulders yesterday when she was out in the yard. She has significant blistering skin eruption on both shoulders. It's weeping. Suspect poison IVY or something along those lines likely. Discussed with anesthesia. This is an elective procedure. Would be best for her to get this taken care of prior to re-grouping for an EGD. We have made arrangements for her to see Dr. Moshe Cipro today.  She will need an office visit with [place] in a couple of weeks to reassess and reschedule.

## 2016-08-14 NOTE — Patient Instructions (Signed)
Take the medrol pak Take all of day one today  Use the hydroxyzine for itching Use the cream 2-3 times a day  Call for problems

## 2016-08-15 DIAGNOSIS — G4700 Insomnia, unspecified: Secondary | ICD-10-CM | POA: Diagnosis not present

## 2016-08-15 DIAGNOSIS — G5602 Carpal tunnel syndrome, left upper limb: Secondary | ICD-10-CM | POA: Diagnosis not present

## 2016-08-15 DIAGNOSIS — G894 Chronic pain syndrome: Secondary | ICD-10-CM | POA: Diagnosis not present

## 2016-08-15 DIAGNOSIS — Z79891 Long term (current) use of opiate analgesic: Secondary | ICD-10-CM | POA: Diagnosis not present

## 2016-08-15 DIAGNOSIS — M62830 Muscle spasm of back: Secondary | ICD-10-CM | POA: Diagnosis not present

## 2016-08-15 DIAGNOSIS — G5601 Carpal tunnel syndrome, right upper limb: Secondary | ICD-10-CM | POA: Diagnosis not present

## 2016-08-15 DIAGNOSIS — M47817 Spondylosis without myelopathy or radiculopathy, lumbosacral region: Secondary | ICD-10-CM | POA: Diagnosis not present

## 2016-08-15 NOTE — Progress Notes (Signed)
Noted  

## 2016-08-16 ENCOUNTER — Encounter (HOSPITAL_COMMUNITY): Payer: Self-pay | Admitting: Psychiatry

## 2016-08-16 ENCOUNTER — Ambulatory Visit (INDEPENDENT_AMBULATORY_CARE_PROVIDER_SITE_OTHER): Payer: Medicare Other | Admitting: Psychiatry

## 2016-08-16 VITALS — BP 124/84 | HR 106 | Wt 171.2 lb

## 2016-08-16 DIAGNOSIS — Z888 Allergy status to other drugs, medicaments and biological substances status: Secondary | ICD-10-CM

## 2016-08-16 DIAGNOSIS — Z814 Family history of other substance abuse and dependence: Secondary | ICD-10-CM

## 2016-08-16 DIAGNOSIS — K219 Gastro-esophageal reflux disease without esophagitis: Secondary | ICD-10-CM

## 2016-08-16 DIAGNOSIS — G4700 Insomnia, unspecified: Secondary | ICD-10-CM

## 2016-08-16 DIAGNOSIS — F411 Generalized anxiety disorder: Secondary | ICD-10-CM | POA: Diagnosis not present

## 2016-08-16 DIAGNOSIS — Z811 Family history of alcohol abuse and dependence: Secondary | ICD-10-CM

## 2016-08-16 DIAGNOSIS — Z880 Allergy status to penicillin: Secondary | ICD-10-CM

## 2016-08-16 DIAGNOSIS — Z87898 Personal history of other specified conditions: Secondary | ICD-10-CM

## 2016-08-16 DIAGNOSIS — Z818 Family history of other mental and behavioral disorders: Secondary | ICD-10-CM

## 2016-08-16 DIAGNOSIS — Z882 Allergy status to sulfonamides status: Secondary | ICD-10-CM

## 2016-08-16 DIAGNOSIS — F329 Major depressive disorder, single episode, unspecified: Secondary | ICD-10-CM | POA: Diagnosis not present

## 2016-08-16 DIAGNOSIS — F410 Panic disorder [episodic paroxysmal anxiety] without agoraphobia: Secondary | ICD-10-CM | POA: Diagnosis not present

## 2016-08-16 DIAGNOSIS — Z7982 Long term (current) use of aspirin: Secondary | ICD-10-CM | POA: Diagnosis not present

## 2016-08-16 DIAGNOSIS — M797 Fibromyalgia: Secondary | ICD-10-CM

## 2016-08-16 DIAGNOSIS — Z79899 Other long term (current) drug therapy: Secondary | ICD-10-CM

## 2016-08-16 MED ORDER — VENLAFAXINE HCL ER 37.5 MG PO CP24: ORAL_CAPSULE | ORAL | 1 refills | Status: DC

## 2016-08-16 MED ORDER — MIRTAZAPINE 15 MG PO TABS: 15.0000 mg | ORAL_TABLET | Freq: Every day | ORAL | 1 refills | Status: DC

## 2016-08-16 MED ORDER — TRAZODONE HCL 50 MG PO TABS: 50.0000 mg | ORAL_TABLET | Freq: Every evening | ORAL | 1 refills | Status: DC | PRN

## 2016-08-16 NOTE — Patient Instructions (Signed)
1. Decrease mirtazapine 15 mg at night 2. Start Effexor 37.5 mg daily for two weeks, then 75 mg daily 3. Continue Trazodone 50 mg at night as needed for sleep 4. Return to clinic in one month

## 2016-08-27 ENCOUNTER — Telehealth (HOSPITAL_COMMUNITY): Payer: Self-pay | Admitting: *Deleted

## 2016-08-27 NOTE — Telephone Encounter (Signed)
Fax received from [facility] stating that Venlafaxine ER is not on the covered list of medications. Called [phone] spoke with Desiree who gave approval from 08/16/16-08/27/17 Case ID [id number]. Called to notify pharmacy.

## 2016-08-29 ENCOUNTER — Telehealth (HOSPITAL_COMMUNITY): Payer: Self-pay | Admitting: *Deleted

## 2016-08-29 NOTE — Telephone Encounter (Signed)
noted 

## 2016-08-29 NOTE — Telephone Encounter (Signed)
Received Fax from [facility] stating that Venlafaxine ER has been approved from 08/16/16-08/27/17

## 2016-08-30 ENCOUNTER — Ambulatory Visit (INDEPENDENT_AMBULATORY_CARE_PROVIDER_SITE_OTHER): Payer: Medicare Other | Admitting: Nurse Practitioner

## 2016-08-30 ENCOUNTER — Encounter: Payer: Self-pay | Admitting: Nurse Practitioner

## 2016-08-30 VITALS — BP 153/113 | HR 107 | Temp 98.0°F | Ht 64.0 in | Wt 175.6 lb

## 2016-08-30 DIAGNOSIS — K219 Gastro-esophageal reflux disease without esophagitis: Secondary | ICD-10-CM

## 2016-08-30 DIAGNOSIS — R1310 Dysphagia, unspecified: Secondary | ICD-10-CM

## 2016-08-30 DIAGNOSIS — K5900 Constipation, unspecified: Secondary | ICD-10-CM | POA: Diagnosis not present

## 2016-08-30 NOTE — Telephone Encounter (Signed)
noted 

## 2016-08-30 NOTE — Progress Notes (Signed)
Referring Provider: Raylene Everts, MD Primary Care Physician:  Raylene Everts, MD Primary GI:  Dr. Gala Romney  Chief Complaint  Patient presents with  . Constipation    HPI:   Claire [name] is a 46 y.o. female who presents for follow-up on constipation, abdominal pain, GERD. Patient was last seen in our office on 07/03/2016 for GERD, dysphagia, constipation, generalized abdominal pain. At her last visit she noted a long-standing history of GERD since teenager, diagnosed with "ulcers" when she was 46 years old but is never had an upper endoscopy. Starting Prilosec 40 mg daily helped a lot, no breakthrough symptoms since starting Prilosec. Notes dysphagia symptoms with solid foods, some pills, occasionally liquids which is persistent and essentially daily basis. Frequent regurgitation. Constipation with going up to a week without stool which results in "small chunks"which she admits is a long-standing problem. She is tried extra probiotics which helps somewhat, no other treatments attempted. Lower abdominal pain improved with bowel movement.  Recommended upper endoscopy with possible dilation, continue Prilosec, samples of Linzess 72 g and call with a progress report in 1-2 weeks, return for follow-up in 3 months. She called about 3 weeks later stating Linzess was working well and a prescription was sent to her pharmacy.  Her EGD was canceled due to presenting to endoscopy with dermatitis on both shoulders with significant blistering and skin eruption, suspected poison ivy or other. Anesthesia declined to proceed with the procedure in this situation. Recommended she follow-up with primary care. Follow-up in the office to reassess and reschedule.  Today she states she's doing ok. She hadn't picked up her Rx and didn't know it was at the pharmacy. It will be ordered and there on Monday (she's aware). Her blisters are much improved, PCP thinks it was poison ivy. She has not had a bowel movement  in 4 days. GERD symptoms improved on Prilosec. Dysphagia is persistent with solid foods, some pills, and some liquids. Denies hematochezia, melena, fever, chills, unintentional weight loss. Denies chest pain, dyspnea, dizziness, lightheadedness, syncope, near syncope. Denies any other upper or lower GI symptoms.  Past Medical History:  Diagnosis Date  . Acid reflux   . Allergy    pollen, dust  . Anxiety   . Asthma   . Carpal tunnel syndrome    bilateral  . DDD (degenerative disc disease), cervical   . Depression   . Fibromyalgia   . Hypertension   . Migraine   . Palpitations   . Panic attacks   . Seizures ([place])    unknown etiology; last seizure was 2 years ago; on meds.  . Syncope and collapse   . Tennis elbow   . Ulcer   . Vertigo     Past Surgical History:  Procedure Laterality Date  . BACK SURGERY  1993  . CHOLECYSTECTOMY    . GALLBLADDER SURGERY    . SHOULDER SURGERY Right   . SPINE SURGERY     lumbar disc surgery    Current Outpatient Prescriptions  Medication Sig Dispense Refill  . albuterol (PROVENTIL HFA;VENTOLIN HFA) 108 (90 Base) MCG/ACT inhaler Inhale 1-2 puffs into the lungs every 6 (six) hours as needed for wheezing or shortness of breath.     . Aspirin-Caffeine (BC FAST PAIN RELIEF ARTHRITIS PO) Take 1 packet by mouth daily as needed (headaches).    . cyclobenzaprine (FLEXERIL) 10 MG tablet Take 1 tablet (10 mg total) by mouth 3 (three) times daily as needed for muscle spasms.  60 tablet 0  . diphenhydrAMINE (BENADRYL) 25 MG tablet Take 50 mg by mouth 2 (two) times daily as needed for allergies.    . hydrOXYzine (ATARAX/VISTARIL) 25 MG tablet Take 1 tablet (25 mg total) by mouth 3 (three) times daily as needed. 30 tablet 0  . levETIRAcetam (KEPPRA) 500 MG tablet Take 1 tablet (500 mg total) by mouth 2 (two) times daily. 60 tablet 8  . linaclotide (LINZESS) 72 MCG capsule Take 1 capsule (72 mcg total) by mouth daily before breakfast. 90 capsule 2  .  Milnacipran (SAVELLA) 50 MG TABS tablet Take 50 mg by mouth 2 (two) times daily.    . mirtazapine (REMERON) 15 MG tablet Take 1 tablet (15 mg total) by mouth at bedtime. 30 tablet 1  . Multiple Vitamin (MULTIVITAMIN WITH MINERALS) TABS tablet Take 1 tablet by mouth daily.    Marland Kitchen omeprazole (PRILOSEC) 40 MG capsule Take 1 capsule (40 mg total) by mouth daily. 30 capsule 3  . Polyvinyl Alcohol-Povidone (REFRESH OP) Apply 2 drops to eye daily as needed (dry eyes).    . Potassium 99 MG TABS Take 99 mg by mouth daily as needed (cramps).     . Probiotic CAPS Take 1 capsule by mouth daily.    . traZODone (DESYREL) 50 MG tablet Take 1 tablet (50 mg total) by mouth at bedtime as needed for sleep. 30 tablet 1  . venlafaxine XR (EFFEXOR-XR) 37.5 MG 24 hr capsule 37.5 mg daily for two weeks, then 75 mg daily 60 capsule 1   No current facility-administered medications for this visit.     Allergies as of 08/30/2016 - Review Complete 08/30/2016  Allergen Reaction Noted  . Claritin [loratadine] Other (See Comments) 07/28/2014  . Cymbalta [duloxetine hcl] Hives 07/29/2012  . Penicillins Hives 07/29/2012  . Sulfa antibiotics Hives 07/29/2012  . Ultram [tramadol] Other (See Comments) 09/12/2014    Family History  Problem Relation Age of Onset  . COPD Mother   . Bronchitis Mother   . Arthritis Mother   . Depression Mother   . Heart disease Mother   . Hypertension Mother   . Cancer - Colon Maternal Grandmother   . Alcohol abuse Father   . Early death Father        GSW  . PKU Son     Social History   Social History  . Marital status: Single    Spouse name: N/A  . Number of children: 1  . Years of education: HS   Occupational History  . disability     unemployed   Social History Main Topics  . Smoking status: Never Smoker  . Smokeless tobacco: Never Used  . Alcohol use Yes     Comment: drink occasionally  . Drug use: No  . Sexual activity: Not Currently    Birth control/ protection:  None   Other Topics Concern  . None   Social History Narrative   Patient is left handed.   Patient drinks very little caffeine.   Lives alone       Review of Systems: General: Negative for anorexia, weight loss, fever, chills, fatigue, weakness. ENT: Negative for hoarseness. Admits continued dysphagia. CV: Negative for chest pain, angina, palpitations, peripheral edema.  Respiratory: Negative for dyspnea at rest, cough, sputum, wheezing.  GI: See history of present illness. MS: Admits chronic pain/fibromyalgia.  Derm: Negative for rash or itching, states poison ivy has cleared up.  Endo: Negative for unusual weight change.  Heme: Negative for bruising  or bleeding. Allergy: Negative for rash or hives.   Physical Exam: BP (!) 153/113   Pulse (!) 107   Temp 98 F (36.7 C) (Oral)   Ht 5\' 4"  (1.626 m)   Wt 175 lb 9.6 oz (79.7 kg)   BMI 30.14 kg/m  General:   Alert and oriented. Pleasant and cooperative. Well-nourished and well-developed.  Eyes:  Without icterus, sclera clear and conjunctiva pink.  Ears:  Normal auditory acuity. Cardiovascular:  S1, S2 present without murmurs appreciated. Extremities without clubbing or edema. Respiratory:  Clear to auscultation bilaterally. No wheezes, rales, or rhonchi. No distress.  Gastrointestinal:  +BS, soft, non-tender and non-distended. No HSM noted. No guarding or rebound. No masses appreciated.  Rectal:  Deferred  Musculoskalatal:  Symmetrical without gross deformities. Skin:  Intact without significant lesions or rashes. Specifically shoulders, bilateral upper extremities examined and no blisters, rashes, or acute changes noted. Neurologic:  Alert and oriented x4;  grossly normal neurologically. Psych:  Alert and cooperative. Normal mood and affect. Heme/Lymph/Immune: No excessive bruising noted.    08/30/2016 11:31 AM   Disclaimer: This note was dictated with voice recognition software. Similar sounding words can inadvertently  be transcribed and may not be corrected upon review.

## 2016-08-30 NOTE — Assessment & Plan Note (Signed)
Description for Linzess 72 g was sent to the pharmacy. Her samples worked well for her. She states the pharmacy never got the prescription, despite it being sent twice. Staff investigated and found at the pharmacy had the medication but somehow communication between the pharmacy and the patient was mixed up in the patient was not told it was they're waiting for her. At this point we will need to reorder it and it should be arrived on Monday. The patient is aware. I will provide her with a boxes samples to last over the weekend until she can pick up her prescription. Return for follow-up in 3 months.

## 2016-08-30 NOTE — Patient Instructions (Signed)
1. Pick up your prescription from the pharmacy on Monday. 2. In the meantime I can provide you with a boxes samples for Linzess 145 g. This may cause some diarrhea. Take 1 pill a to last do the weekend and so you can pick up your prescription. 3. We will schedule your procedure for you. 4. Return for follow-up in 3 months. 5. Call us with any significant worsening or severe symptoms.

## 2016-08-30 NOTE — Assessment & Plan Note (Signed)
Continues with dysphagia symptoms with solid foods, some pills, sometimes liquids. Previous EGD had to be canceled due to weeping blisters from likely poison ivy. These have resolved and no signs of acute skin changes on exam today. Likely dysphagia from prolonged GERD that has been poorly controlled. Cannot rule out stricture, web, ring, less likely tumor/mass. We will proceed with rescheduling her upper endoscopy today. Return for follow-up in 3 months.  Proceed with EGD +/- dilation on propofol/MAC with Dr. Gala Romney in near future: the risks, benefits, and alternatives have been discussed with the patient in detail. The patient states understanding and desires to proceed.  The patient has chronic pain and fibromyalgia, she is on Savella and trazodone. No other anticoagulants, anxiolytics, chronic pain medications, or antidepressants. We will plan for the procedure on propofol/MAC to promote adequate sedation.

## 2016-08-30 NOTE — Assessment & Plan Note (Signed)
Significantly improved/essentially resolved on Prilosec. Recommend continue Prilosec. Return for follow-up in 3 months.

## 2016-08-30 NOTE — Progress Notes (Signed)
CC'ED TO PCP 

## 2016-09-02 ENCOUNTER — Other Ambulatory Visit: Payer: Self-pay

## 2016-09-02 ENCOUNTER — Telehealth: Payer: Self-pay

## 2016-09-02 DIAGNOSIS — K219 Gastro-esophageal reflux disease without esophagitis: Secondary | ICD-10-CM

## 2016-09-02 DIAGNOSIS — R1310 Dysphagia, unspecified: Secondary | ICD-10-CM

## 2016-09-02 NOTE — Telephone Encounter (Signed)
Tried to call pt, Claire [name] and informed of pre-op appt 10/14/16 at 12:45pm. Letter mailed.
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
## 2016-09-13 ENCOUNTER — Ambulatory Visit (INDEPENDENT_AMBULATORY_CARE_PROVIDER_SITE_OTHER): Payer: Medicare Other | Admitting: Family Medicine

## 2016-09-13 ENCOUNTER — Encounter: Payer: Self-pay | Admitting: Family Medicine

## 2016-09-13 VITALS — BP 146/102 | HR 100 | Temp 98.3°F | Resp 18 | Ht 64.0 in | Wt 175.1 lb

## 2016-09-13 DIAGNOSIS — M797 Fibromyalgia: Secondary | ICD-10-CM | POA: Diagnosis not present

## 2016-09-13 DIAGNOSIS — N951 Menopausal and female climacteric states: Secondary | ICD-10-CM

## 2016-09-13 DIAGNOSIS — R7309 Other abnormal glucose: Secondary | ICD-10-CM | POA: Diagnosis not present

## 2016-09-13 DIAGNOSIS — F410 Panic disorder [episodic paroxysmal anxiety] without agoraphobia: Secondary | ICD-10-CM | POA: Diagnosis not present

## 2016-09-13 DIAGNOSIS — I10 Essential (primary) hypertension: Secondary | ICD-10-CM | POA: Diagnosis not present

## 2016-09-13 LAB — FOLLICLE STIMULATING HORMONE: FSH: 94.5 m[IU]/mL

## 2016-09-13 LAB — LUTEINIZING HORMONE: LH: 51.8 m[IU]/mL

## 2016-09-13 MED ORDER — METOPROLOL SUCCINATE ER 25 MG PO TB24: 25.0000 mg | ORAL_TABLET | Freq: Every day | ORAL | 3 refills | Status: DC

## 2016-09-13 MED ORDER — CYCLOBENZAPRINE HCL 10 MG PO TABS: 10.0000 mg | ORAL_TABLET | Freq: Three times a day (TID) | ORAL | 0 refills | Status: DC | PRN

## 2016-09-13 NOTE — Progress Notes (Signed)
Chief Complaint  Patient presents with  . Follow-up    2 month   Patient is here for routine follow-up. She has new complaint of night sweats. She thinks that she is in menopause. They're interrupting her sleep. They're making her irritable. They're increasing her pain. They're decreasing her ability to cope with her pain in her life stress. She wants to know if she can take "hormones". I told the first step is getting blood work to confirm menopause. I believe she needs to be seen by GYN if she wants to start hormones as I do not do postmenopausal hormonal replacement. She is overdue for her Pap. She is overdue for breast exam and mammogram. I told her that I could schedule her for those procedures here, she declines. Discussed with her her elevated blood pressure. She tells me that elevated due to "pain". In review of her medical record 10 of her last 13 blood pressures were elevated, and I believe she has hypertension. Please she requires treatment. She also has tachycardia. Her tachycardia causes panic attacks. Because of this I'm choosing to start her on a beta blocker to see if it helps both problems. She states that her acid reflux is well controlled. She states that her fibromyalgia and chronic pain syndrome is not controlled. She is very inactive. She is distressed that she has gained weight.  Patient Active Problem List   Diagnosis Date Noted  . Dysphagia 07/03/2016  . Constipation 07/03/2016  . Abdominal pain 07/03/2016  . Generalized anxiety disorder 06/17/2016  . Panic disorder 06/17/2016  . Asthma in adult, mild intermittent, uncomplicated 40/97/3532  . GERD without esophagitis 06/11/2016  . 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 Frequent falls 06/11/2016  . Vertigo 06/11/2016  . History of syncope 06/11/2016  . Fibromyalgia 07/28/2014    Outpatient Encounter Prescriptions as of 09/13/2016  Medication Sig  . albuterol (PROVENTIL HFA;VENTOLIN HFA) 108 (90 Base) MCG/ACT inhaler  Inhale 1-2 puffs into the lungs every 6 (six) hours as needed for wheezing or shortness of breath.   . Aspirin-Caffeine (BC FAST PAIN RELIEF ARTHRITIS PO) Take 1 packet by mouth daily as needed (headaches).  . cyclobenzaprine (FLEXERIL) 10 MG tablet Take 1 tablet (10 mg total) by mouth 3 (three) times daily as needed for muscle spasms.  . diphenhydrAMINE (BENADRYL) 25 MG tablet Take 50 mg by mouth 2 (two) times daily as needed for allergies.  Marland Kitchen HYDROcodone-acetaminophen (NORCO/VICODIN) 5-325 MG tablet 1 tablet every 6 (six) hours as needed. Pain DR  . hydrOXYzine (ATARAX/VISTARIL) 25 MG tablet Take 1 tablet (25 mg total) by mouth 3 (three) times daily as needed.  . levETIRAcetam (KEPPRA) 500 MG tablet Take 1 tablet (500 mg total) by mouth 2 (two) times daily.  Marland Kitchen linaclotide (LINZESS) 72 MCG capsule Take 1 capsule (72 mcg total) by mouth daily before breakfast.  . Milnacipran (SAVELLA) 50 MG TABS tablet Take 50 mg by mouth 2 (two) times daily.  . mirtazapine (REMERON) 15 MG tablet Take 1 tablet (15 mg total) by mouth at bedtime.  . Multiple Vitamin (MULTIVITAMIN WITH MINERALS) TABS tablet Take 1 tablet by mouth daily.  Marland Kitchen omeprazole (PRILOSEC) 40 MG capsule Take 1 capsule (40 mg total) by mouth daily.  . Polyvinyl Alcohol-Povidone (REFRESH OP) Apply 2 drops to eye daily as needed (dry eyes).  . Potassium 99 MG TABS Take 99 mg by mouth daily as needed (cramps).   . Probiotic CAPS Take 1 capsule by mouth daily.  . traZODone (DESYREL)  50 MG tablet Take 1 tablet (50 mg total) by mouth at bedtime as needed for sleep.  Marland Kitchen venlafaxine XR (EFFEXOR-XR) 37.5 MG 24 hr capsule 37.5 mg daily for two weeks, then 75 mg daily  . [DISCONTINUED] cyclobenzaprine (FLEXERIL) 10 MG tablet Take 1 tablet (10 mg total) by mouth 3 (three) times daily as needed for muscle spasms.  . metoprolol succinate (TOPROL-XL) 25 MG 24 hr tablet Take 1 tablet (25 mg total) by mouth daily.   No facility-administered encounter medications  on file as of 09/13/2016.     Allergies  Allergen Reactions  . Claritin [Loratadine] Other (See Comments)    shaking  . Cymbalta [Duloxetine Hcl] Hives    "Hives, forgot who I was"  . Penicillins Hives    Has patient had a PCN reaction causing immediate rash, facial/tongue/throat swelling, SOB or lightheadedness with hypotension: Unknown Has patient had a PCN reaction causing severe rash involving mucus membranes or skin necrosis: Yes Has patient had a PCN reaction that required hospitalization: Unknown Has patient had a PCN reaction occurring within the last 10 years: Unknown If all of the above answers are "NO", then may proceed with Cephalosporin use.  . Sulfa Antibiotics Hives  . Ultram [Tramadol] Other (See Comments)    Dizzy    Review of Systems  Constitutional: Positive for fatigue. Negative for unexpected weight change.  Eyes: Positive for visual disturbance.  Respiratory: Negative for cough, shortness of breath and wheezing.   Cardiovascular: Negative for chest pain and palpitations.  Gastrointestinal: Negative for constipation and diarrhea.  Musculoskeletal: Positive for arthralgias, back pain, myalgias, neck pain and neck stiffness.  Neurological: Negative for dizziness and headaches.  Psychiatric/Behavioral: Positive for dysphoric mood and sleep disturbance. The patient is not nervous/anxious.     BP (!) 146/102 (BP Location: Right Arm, Patient Position: Sitting, Cuff Size: Normal)   Pulse 100   Temp 98.3 F (36.8 C) (Temporal)   Resp 18   Ht 5\' 4"  (1.626 m)   Wt 175 lb 1.3 oz (79.4 kg)   SpO2 98%   BMI 30.05 kg/m   Physical Exam  Constitutional: She is oriented to person, place, and time. She appears well-developed and well-nourished.  Stiff and guarded.  Pulls away from even light touch to shoulder  HENT:  Head: Normocephalic and atraumatic.  Mouth/Throat: Oropharynx is clear and moist.  Eyes: Conjunctivae are normal. Pupils are equal, round, and reactive  to light.  Neck: Normal range of motion.  Cardiovascular: Normal rate, regular rhythm and normal heart sounds.   Pulmonary/Chest: Effort normal and breath sounds normal. She has no wheezes.  Musculoskeletal:  Using a cane today. Stiff and uncomfortable. Walks in a flexed position, antalgic gait  Lymphadenopathy:    She has no cervical adenopathy.  Neurological: She is alert and oriented to person, place, and time.  Psychiatric: She has a normal mood and affect. Her behavior is normal.  Bland affect    ASSESSMENT/PLAN:  1. Perimenopausal Hot flashes, irregular menses - FSH - LH  2. Panic disorder Under care of psychiatry  3. Fibromyalgia On Savella, pain management  4. Essential hypertension Start Toprol 25 mg. Check blood pressure in 2 weeks  5. Elevated glucose level - Hemoglobin A1c   Patient Instructions  Need labs today Continue to watch your diet Drink plenty of water I am starting metoprolol This is for blood pressure and fast heart rate  Come back in a month for a BP check   Raylene Everts,  MD

## 2016-09-13 NOTE — Patient Instructions (Signed)
Need labs today Continue to watch your diet Drink plenty of water I am starting metoprolol This is for blood pressure and fast heart rate  Come back in a month for a BP check

## 2016-09-14 LAB — HEMOGLOBIN A1C
Hgb A1c MFr Bld: 4.9 % (ref <5.7)
Mean Plasma Glucose: 94 mg/dL

## 2016-09-16 ENCOUNTER — Encounter (HOSPITAL_COMMUNITY): Payer: Self-pay | Admitting: Psychiatry

## 2016-09-16 ENCOUNTER — Ambulatory Visit (INDEPENDENT_AMBULATORY_CARE_PROVIDER_SITE_OTHER): Payer: Medicare Other | Admitting: Psychiatry

## 2016-09-16 ENCOUNTER — Telehealth (HOSPITAL_COMMUNITY): Payer: Self-pay

## 2016-09-16 ENCOUNTER — Other Ambulatory Visit (HOSPITAL_COMMUNITY): Payer: Self-pay | Admitting: Psychiatry

## 2016-09-16 VITALS — BP 118/82 | HR 95 | Ht 64.0 in | Wt 176.0 lb

## 2016-09-16 DIAGNOSIS — Z79891 Long term (current) use of opiate analgesic: Secondary | ICD-10-CM

## 2016-09-16 DIAGNOSIS — Z880 Allergy status to penicillin: Secondary | ICD-10-CM | POA: Diagnosis not present

## 2016-09-16 DIAGNOSIS — Z888 Allergy status to other drugs, medicaments and biological substances status: Secondary | ICD-10-CM

## 2016-09-16 DIAGNOSIS — F410 Panic disorder [episodic paroxysmal anxiety] without agoraphobia: Secondary | ICD-10-CM

## 2016-09-16 DIAGNOSIS — F411 Generalized anxiety disorder: Secondary | ICD-10-CM | POA: Diagnosis not present

## 2016-09-16 DIAGNOSIS — Z811 Family history of alcohol abuse and dependence: Secondary | ICD-10-CM

## 2016-09-16 DIAGNOSIS — G4700 Insomnia, unspecified: Secondary | ICD-10-CM

## 2016-09-16 DIAGNOSIS — F329 Major depressive disorder, single episode, unspecified: Secondary | ICD-10-CM | POA: Diagnosis not present

## 2016-09-16 DIAGNOSIS — M797 Fibromyalgia: Secondary | ICD-10-CM

## 2016-09-16 DIAGNOSIS — Z882 Allergy status to sulfonamides status: Secondary | ICD-10-CM

## 2016-09-16 DIAGNOSIS — Z818 Family history of other mental and behavioral disorders: Secondary | ICD-10-CM

## 2016-09-16 DIAGNOSIS — G8929 Other chronic pain: Secondary | ICD-10-CM

## 2016-09-16 DIAGNOSIS — Z79899 Other long term (current) drug therapy: Secondary | ICD-10-CM

## 2016-09-16 DIAGNOSIS — K219 Gastro-esophageal reflux disease without esophagitis: Secondary | ICD-10-CM | POA: Diagnosis not present

## 2016-09-16 MED ORDER — VENLAFAXINE HCL ER 37.5 MG PO CP24: 112.5000 mg | ORAL_CAPSULE | Freq: Every day | ORAL | 1 refills | Status: DC

## 2016-09-16 MED ORDER — VENLAFAXINE HCL ER 37.5 MG PO CP24: 37.5000 mg | ORAL_CAPSULE | Freq: Every day | ORAL | 1 refills | Status: DC

## 2016-09-16 MED ORDER — MIRTAZAPINE 15 MG PO TABS: 15.0000 mg | ORAL_TABLET | Freq: Every day | ORAL | 1 refills | Status: DC

## 2016-09-16 MED ORDER — VENLAFAXINE HCL ER 75 MG PO CP24: 75.0000 mg | ORAL_CAPSULE | Freq: Every day | ORAL | 1 refills | Status: DC

## 2016-09-16 MED ORDER — TRAZODONE HCL 50 MG PO TABS: 50.0000 mg | ORAL_TABLET | Freq: Every evening | ORAL | 1 refills | Status: DC | PRN

## 2016-09-16 NOTE — Telephone Encounter (Signed)
received a fax requesting new rx  because per pt insurance pt can only receive 2 max on medication venlafaxine er 37.5mg .   so the pharmacy suggest changed to venlafaxine 75mg  x 1 a day plus venlafaxine er  37.5mg  once daily.      Disp Refills Start End   venlafaxine XR (EFFEXOR-XR) 37.5 MG 24 hr capsule 90 capsule 1 09/16/2016    Sig - Route: Take 3 capsules (112.5 mg total) by mouth daily with breakfast. - Oral   E-Prescribing Status: Receipt confirmed by pharmacy (09/16/2016 9:49 AM EDT)
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
## 2016-09-16 NOTE — Patient Instructions (Signed)
1. Continue mirtazapine 15 mg at night 2. Increase Effexor 112.5 mg daily 3. Continue Trazodone 50 mg at night as needed for sleep 4. Return to clinic in one month

## 2016-09-16 NOTE — Telephone Encounter (Signed)
Done

## 2016-09-17 DIAGNOSIS — G894 Chronic pain syndrome: Secondary | ICD-10-CM | POA: Diagnosis not present

## 2016-09-17 DIAGNOSIS — G5601 Carpal tunnel syndrome, right upper limb: Secondary | ICD-10-CM | POA: Diagnosis not present

## 2016-09-17 DIAGNOSIS — M47817 Spondylosis without myelopathy or radiculopathy, lumbosacral region: Secondary | ICD-10-CM | POA: Diagnosis not present

## 2016-09-17 DIAGNOSIS — G5602 Carpal tunnel syndrome, left upper limb: Secondary | ICD-10-CM | POA: Diagnosis not present

## 2016-09-19 ENCOUNTER — Telehealth: Payer: Self-pay | Admitting: Family Medicine

## 2016-09-19 NOTE — Telephone Encounter (Signed)
She was sent a message in My Chart.  They indicate she is in menopause.  Needs to GYN if she desires hormone therapy.

## 2016-09-19 NOTE — Telephone Encounter (Signed)
Spoke to patient, gave her [facility] info.

## 2016-09-19 NOTE — Telephone Encounter (Signed)
Patient informed of message below, verbalized understanding.  She is interested in seeing GYN but is not familiar with providers in this area, wants to know if Dr Meda Coffee has any recommendations.

## 2016-09-19 NOTE — Telephone Encounter (Signed)
Patient called and left message on nurse line, she wants some more information on her recent lab results. What does she need to know regarding them?

## 2016-09-19 NOTE — Telephone Encounter (Signed)
[facility]

## 2016-09-23 ENCOUNTER — Encounter (HOSPITAL_COMMUNITY): Payer: Self-pay | Admitting: Emergency Medicine

## 2016-09-23 ENCOUNTER — Emergency Department (HOSPITAL_COMMUNITY)
Admission: EM | Admit: 2016-09-23 | Discharge: 2016-09-23 | Disposition: A | Discharge status: Home / Self Care | Payer: Medicare Other | Attending: Emergency Medicine | Admitting: Emergency Medicine

## 2016-09-23 DIAGNOSIS — J4520 Mild intermittent asthma, uncomplicated: Secondary | ICD-10-CM | POA: Diagnosis not present

## 2016-09-23 DIAGNOSIS — R569 Unspecified convulsions: Secondary | ICD-10-CM | POA: Diagnosis not present

## 2016-09-23 DIAGNOSIS — M25571 Pain in right ankle and joints of right foot: Secondary | ICD-10-CM | POA: Diagnosis not present

## 2016-09-23 DIAGNOSIS — R002 Palpitations: Secondary | ICD-10-CM | POA: Insufficient documentation

## 2016-09-23 DIAGNOSIS — R0602 Shortness of breath: Secondary | ICD-10-CM | POA: Diagnosis present

## 2016-09-23 DIAGNOSIS — I10 Essential (primary) hypertension: Secondary | ICD-10-CM | POA: Insufficient documentation

## 2016-09-23 DIAGNOSIS — Z79899 Other long term (current) drug therapy: Secondary | ICD-10-CM | POA: Diagnosis not present

## 2016-09-23 MED ORDER — PREDNISONE 20 MG PO TABS: 40.0000 mg | ORAL_TABLET | Freq: Every day | ORAL | 0 refills | Status: DC

## 2016-09-23 MED ORDER — ALBUTEROL SULFATE (2.5 MG/3ML) 0.083% IN NEBU
2.5000 mg | INHALATION_SOLUTION | Freq: Once | RESPIRATORY_TRACT | Status: AC
  Administered 2016-09-23: 2.5 mg via RESPIRATORY_TRACT
  Filled 2016-09-23: qty 3

## 2016-09-23 MED ORDER — IPRATROPIUM-ALBUTEROL 0.5-2.5 (3) MG/3ML IN SOLN
3.0000 mL | Freq: Once | RESPIRATORY_TRACT | Status: AC
  Administered 2016-09-23: 3 mL via RESPIRATORY_TRACT
  Filled 2016-09-23: qty 3

## 2016-09-23 MED ORDER — PREDNISONE 50 MG PO TABS
60.0000 mg | ORAL_TABLET | Freq: Once | ORAL | Status: AC
  Administered 2016-09-23: 60 mg via ORAL
  Filled 2016-09-23: qty 1

## 2016-09-23 MED ORDER — METHOCARBAMOL 500 MG PO TABS: 500.0000 mg | ORAL_TABLET | Freq: Three times a day (TID) | ORAL | 0 refills | Status: DC

## 2016-09-23 NOTE — Discharge Instructions (Signed)
Continue your albuterol inhaler 1-2 puffs every 4-6 hrs as needed.  Start the prednisone tomorrow.  Elevate and apply ice packs on/off to your ankle,.  Stop the baclofen and follow-up with your primary provider.

## 2016-09-23 NOTE — ED Triage Notes (Signed)
Pt started baclofen and venlafaxine hcl 6 days ago. States starting having ankles swelling and sob 4 days ago. Pt stopped taking meds 2 days ago. Nad. No sob noted.

## 2016-09-23 NOTE — ED Notes (Signed)
Pt states chest is burning. Will alert Tammy Triplett.

## 2016-09-23 NOTE — ED Provider Notes (Signed)
[facility] DEPT Provider Note   CSN: [id number] Arrival date & time: 09/23/16  1048     History   Chief Complaint Chief Complaint  Patient presents with  . Medication Reaction    HPI Claire [name] is a 46 y.o. female.  HPI   Claire [name] is a 46 y.o. female who presents to the Emergency Department complaining of swelling to her  ankles and shortness of breath for 4 days.  Right ankle worse than left.  States that her PCP started her on baclofen and Effexor nearly one week ago and she is concerned that the medication maybe the cause.  She stopped taking the medication 2 days prior.  She reports hx of asthma and has ran out of her albuterol inhaler. She also complains of pain to the ankle associated with movement and weight bearing. She denies rash, chest pain, facial swelling, and difficulty swallowing  Past Medical History:  Diagnosis Date  . Acid reflux   . Allergy    pollen, dust  . Anxiety   . Asthma   . Carpal tunnel syndrome    bilateral  . DDD (degenerative disc disease), cervical   . Depression   . Fibromyalgia   . Hypertension   . Migraine   . Palpitations   . Panic attacks   . Seizures ([place])    unknown etiology; last seizure was 2 years ago; on meds.  . Syncope and collapse   . Tennis elbow   . Ulcer   . Vertigo     Patient Active Problem List   Diagnosis Date Noted  . Dysphagia 07/03/2016  . Constipation 07/03/2016  . Abdominal pain 07/03/2016  . Generalized anxiety disorder 06/17/2016  . Panic disorder 06/17/2016  . Asthma in adult, mild intermittent, uncomplicated 17/61/6073  . GERD without esophagitis 06/11/2016  . 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 Frequent falls 06/11/2016  . Vertigo 06/11/2016  . History of syncope 06/11/2016  . Fibromyalgia 07/28/2014    Past Surgical History:  Procedure Laterality Date  . BACK SURGERY  1993  . CHOLECYSTECTOMY    . GALLBLADDER SURGERY    . SHOULDER SURGERY Right   . SPINE SURGERY     lumbar disc  surgery    OB History    No data available       Home Medications    Prior to Admission medications   Medication Sig Start Date End Date Taking? Authorizing Provider  albuterol (PROVENTIL HFA;VENTOLIN HFA) 108 (90 Base) MCG/ACT inhaler Inhale 1-2 puffs into the lungs every 6 (six) hours as needed for wheezing or shortness of breath.    Yes [provider]  baclofen (LIORESAL) 10 MG tablet Take 10 mg by mouth 3 (three) times daily.   Yes [provider]  cyclobenzaprine (FLEXERIL) 10 MG tablet Take 1 tablet (10 mg total) by mouth 3 (three) times daily as needed for muscle spasms. 09/13/16  Yes Raylene Everts, MD  diphenhydrAMINE (BENADRYL) 25 MG tablet Take 50 mg by mouth 2 (two) times daily as needed for allergies.   Yes [provider]  HYDROcodone-acetaminophen (NORCO/VICODIN) 5-325 MG tablet 1 tablet every 6 (six) hours as needed. Pain DR 08/16/16  Yes [provider]  levETIRAcetam (KEPPRA) 500 MG tablet Take 1 tablet (500 mg total) by mouth 2 (two) times daily. 05/15/15  Yes Dennie Bible, NP  linaclotide [facility]) 72 MCG capsule Take 1 capsule (72 mcg total) by mouth daily before breakfast. 07/18/16  Yes Carlis Stable,  NP  metoprolol succinate (TOPROL-XL) 25 MG 24 hr tablet Take 1 tablet (25 mg total) by mouth daily. 09/13/16  Yes Raylene Everts, MD  Milnacipran (SAVELLA) 50 MG TABS tablet Take 50 mg by mouth 2 (two) times daily.   Yes [provider]  mirtazapine (REMERON) 15 MG tablet Take 1 tablet (15 mg total) by mouth at bedtime. 09/16/16  Yes Norman Clay, MD  Multiple Vitamin (MULTIVITAMIN WITH MINERALS) TABS tablet Take 1 tablet by mouth daily.   Yes [provider]  omeprazole (PRILOSEC) 40 MG capsule Take 1 capsule (40 mg total) by mouth daily. 06/11/16  Yes Raylene Everts, MD  Polyvinyl Alcohol-Povidone (REFRESH OP) Apply 2 drops to eye daily as needed (dry eyes).   Yes [provider]  Potassium 99 MG  TABS Take 99 mg by mouth daily as needed (cramps).    Yes [provider]  Probiotic CAPS Take 1 capsule by mouth daily.   Yes [provider]  traZODone (DESYREL) 50 MG tablet Take 1 tablet (50 mg total) by mouth at bedtime as needed for sleep. 09/16/16  Yes Norman Clay, MD  venlafaxine XR (EFFEXOR-XR) 37.5 MG 24 hr capsule Take 1 capsule (37.5 mg total) by mouth daily with breakfast. Total of 112.5 mg (75+37.5mg ) daily 09/16/16  Yes Hisada, Elie Goody, MD  venlafaxine XR (EFFEXOR-XR) 75 MG 24 hr capsule Take 1 capsule (75 mg total) by mouth daily with breakfast. Total of 112.5 mg (75+37.5mg ) daily 09/16/16  Yes Hisada, Elie Goody, MD  hydrOXYzine (ATARAX/VISTARIL) 25 MG tablet Take 1 tablet (25 mg total) by mouth 3 (three) times daily as needed. Patient not taking: Reported on 09/23/2016 08/14/16   Raylene Everts, MD    Family History Family History  Problem Relation Age of Onset  . COPD Mother   . Bronchitis Mother   . Arthritis Mother   . Depression Mother   . Heart disease Mother   . Hypertension Mother   . Cancer - Colon Maternal Grandmother   . Alcohol abuse Father   . Early death Father        GSW  . PKU Son     Social History Social History  Substance Use Topics  . Smoking status: Never Smoker  . Smokeless tobacco: Never Used  . Alcohol use Yes     Comment: drink occasionally     Allergies   Claritin [loratadine]; Cymbalta [duloxetine hcl]; Penicillins; Sulfa antibiotics; and Ultram [tramadol]   Review of Systems Review of Systems  Constitutional: Negative for chills and fever.  HENT: Negative for facial swelling, sore throat and trouble swallowing.   Respiratory: Positive for shortness of breath and wheezing. Negative for chest tightness.   Cardiovascular: Negative for chest pain.  Gastrointestinal: Negative for abdominal pain and nausea.  Genitourinary: Negative for dysuria.  Musculoskeletal: Positive for arthralgias (ankle pain) and joint swelling.    Skin: Negative for color change and rash.  Neurological: Negative for dizziness, syncope, weakness and numbness.  Psychiatric/Behavioral: Negative for confusion.     Physical Exam Updated Vital Signs BP (!) 154/102 (BP Location: Left Arm)   Pulse (!) 101   Temp 98.2 F (36.8 C) (Oral)   Resp 18   Ht 5\' 4"  (1.626 m)   Wt 79.4 kg (175 lb)   LMP 06/24/2016   SpO2 99%   BMI 30.04 kg/m   Physical Exam  Constitutional: She is oriented to person, place, and time. She appears well-developed and well-nourished. No distress.  HENT:  Head: Atraumatic.  Mouth/Throat: Oropharynx is clear and moist.  Neck: Normal range of motion.  Cardiovascular: Normal rate, regular rhythm, normal heart sounds and intact distal pulses.   No murmur heard. Pulmonary/Chest: Effort normal. No stridor. No respiratory distress. She has no rales. She exhibits no tenderness.  Few inspiratory wheezes bilaterally. No respiratory distress  Musculoskeletal: She exhibits tenderness. She exhibits no edema.  ttp of the right anterolateral ankle.  Mild edema noted to the ankle joint.  No proximal edema.  No edema or tenderness of the left ankle   Neurological: She is alert and oriented to person, place, and time. No sensory deficit.  Skin: Skin is warm. Capillary refill takes less than 2 seconds. No rash noted. No erythema.  Psychiatric: She has a normal mood and affect.  Nursing note and vitals reviewed.    ED Treatments / Results  Labs (all labs ordered are listed, but only abnormal results are displayed) Labs Reviewed - No data to display  EKG  EKG Interpretation None       Radiology No results found.   Procedures Procedures (including critical care time)  Medications Ordered in ED Medications  ipratropium-albuterol (DUONEB) 0.5-2.5 (3) MG/3ML nebulizer solution 3 mL (3 mLs Nebulization Given 09/23/16 1138)  albuterol (PROVENTIL) (2.5 MG/3ML) 0.083% nebulizer solution 2.5 mg (2.5 mg Nebulization  Given 09/23/16 1138)  predniSONE (DELTASONE) tablet 60 mg (60 mg Oral Given 09/23/16 1128)     Initial Impression / Assessment and Plan / ED Course  I have reviewed the triage vital signs and the nursing notes.  Pertinent labs & imaging results that were available during my care of the patient were reviewed by me and considered in my medical decision making (see chart for details).    On recheck,  Pt is feeling better and states she is ready for d/c.  Lung sounds improved after neb. Vitals stable, doubt PE or DVT.    ASO applied to ankle.  Pain improved.    Has hx of asthma, will prescribe prednisone and robaxin, have her d/c the baclofen and Effexor and f/u with PCP.  Return precautions discussed.   Final Clinical Impressions(s) / ED Diagnoses   Final diagnoses:  Mild intermittent asthma without complication  Acute right ankle pain    New Prescriptions New Prescriptions   No medications on file     Kem Parkinson, Hershal Coria 09/25/16 2212    Francine Graven, DO 09/27/16 1548

## 2016-10-03 ENCOUNTER — Ambulatory Visit: Payer: Self-pay | Admitting: Nurse Practitioner

## 2016-10-06 ENCOUNTER — Other Ambulatory Visit: Payer: Self-pay | Admitting: Family Medicine

## 2016-10-07 ENCOUNTER — Other Ambulatory Visit: Payer: Self-pay

## 2016-10-07 ENCOUNTER — Ambulatory Visit: Payer: Medicare Other

## 2016-10-07 VITALS — BP 110/74 | HR 88

## 2016-10-07 DIAGNOSIS — I10 Essential (primary) hypertension: Secondary | ICD-10-CM

## 2016-10-07 MED ORDER — OMEPRAZOLE 40 MG PO CPDR: 40.0000 mg | DELAYED_RELEASE_CAPSULE | Freq: Every day | ORAL | 1 refills | Status: DC

## 2016-10-07 NOTE — Telephone Encounter (Signed)
Seen [phone] 

## 2016-10-08 DIAGNOSIS — M7551 Bursitis of right shoulder: Secondary | ICD-10-CM | POA: Diagnosis not present

## 2016-10-08 DIAGNOSIS — M25511 Pain in right shoulder: Secondary | ICD-10-CM | POA: Diagnosis not present

## 2016-10-10 ENCOUNTER — Ambulatory Visit (INDEPENDENT_AMBULATORY_CARE_PROVIDER_SITE_OTHER): Payer: Medicare Other | Admitting: Neurology

## 2016-10-10 ENCOUNTER — Encounter: Payer: Self-pay | Admitting: Neurology

## 2016-10-10 ENCOUNTER — Telehealth: Payer: Self-pay | Admitting: Neurology

## 2016-10-10 VITALS — BP 139/96 | HR 90 | Ht 64.0 in | Wt 140.0 lb

## 2016-10-10 DIAGNOSIS — M797 Fibromyalgia: Secondary | ICD-10-CM

## 2016-10-10 MED ORDER — LEVETIRACETAM 500 MG PO TABS: 500.0000 mg | ORAL_TABLET | Freq: Two times a day (BID) | ORAL | 3 refills | Status: DC

## 2016-10-10 NOTE — Telephone Encounter (Signed)
Pt was seen today-did not schedule 1 yr f/u b/c she could not towards her balance.

## 2016-10-10 NOTE — Patient Instructions (Signed)
KEYSI OELKERS  10/10/2016     @PREFPERIOPPHARMACY @   Your procedure is scheduled on 10/21/2016.  Report to [facility] at 8:00 A.M.  Call this number if you have problems the morning of surgery:  [phone]   Remember:  Do not eat food or drink liquids after midnight.  Take these medicines the morning of surgery with A SIP OF WATER Albuterol inhaler and bring with you, Hydrocodone if needed, Keppra, Linzess,  Robaxin, Savella, Remeron, Prilosec, Effexor, Toprol   Do not wear jewelry, make-up or nail polish.  Do not wear lotions, powders, or perfumes, or deoderant.  Do not shave 48 hours prior to surgery.  Men may shave face and neck.  Do not bring valuables to the hospital.  [facility] is not responsible for any belongings or valuables.  Contacts, dentures or bridgework may not be worn into surgery.  Leave your suitcase in the car.  After surgery it may be brought to your room.  For patients admitted to the hospital, discharge time will be determined by your treatment team.  Patients discharged the day of surgery will not be allowed to drive home.    Please read over the following fact sheets that you were given. Anesthesia Post-op Instructions     PATIENT INSTRUCTIONS POST-ANESTHESIA  IMMEDIATELY FOLLOWING SURGERY:  Do not drive or operate machinery for the first twenty four hours after surgery.  Do not make any important decisions for twenty four hours after surgery or while taking narcotic pain medications or sedatives.  If you develop intractable nausea and vomiting or a severe headache please notify your doctor immediately.  FOLLOW-UP:  Please make an appointment with your surgeon as instructed. You do not need to follow up with anesthesia unless specifically instructed to do so.  WOUND CARE INSTRUCTIONS (if applicable):  Keep a dry clean dressing on the anesthesia/puncture wound site if there is drainage.  Once the wound has quit draining you may leave it open to  air.  Generally you should leave the bandage intact for twenty four hours unless there is drainage.  If the epidural site drains for more than 36-48 hours please call the anesthesia department.  QUESTIONS?:  Please feel free to call your physician or the hospital operator if you have any questions, and they will be happy to assist you.      Esophagogastroduodenoscopy Esophagogastroduodenoscopy (EGD) is a procedure to examine the lining of the esophagus, stomach, and first part of the small intestine (duodenum). This procedure is done to check for problems such as inflammation, bleeding, ulcers, or growths. During this procedure, a long, flexible, lighted tube with a camera attached (endoscope) is inserted down the throat. Tell a health care provider about:  Any allergies you have.  All medicines you are taking, including vitamins, herbs, eye drops, creams, and over-the-counter medicines.  Any problems you or family members have had with anesthetic medicines.  Any blood disorders you have.  Any surgeries you have had.  Any medical conditions you have.  Whether you are pregnant or may be pregnant. What are the risks? Generally, this is a safe procedure. However, problems may occur, including:  Infection.  Bleeding.  A tear (perforation) in the esophagus, stomach, or duodenum.  Trouble breathing.  Excessive sweating.  Spasms of the larynx.  A slowed heartbeat.  Low blood pressure.  What happens before the procedure?  Follow instructions from your health care provider about eating or drinking restrictions.  Ask your health care provider  about: ? Changing or stopping your regular medicines. This is especially important if you are taking diabetes medicines or blood thinners. ? Taking medicines such as aspirin and ibuprofen. These medicines can thin your blood. Do not take these medicines before your procedure if your health care provider instructs you not to.  Plan to have  someone take you home after the procedure.  If you wear dentures, be ready to remove them before the procedure. What happens during the procedure?  To reduce your risk of infection, your health care team will wash or sanitize their hands.  An IV tube will be put in a vein in your hand or arm. You will get medicines and fluids through this tube.  You will be given one or more of the following: ? A medicine to help you relax (sedative). ? A medicine to numb the area (local anesthetic). This medicine may be sprayed into your throat. It will make you feel more comfortable and keep you from gagging or coughing during the procedure. ? A medicine for pain.  A mouth guard may be placed in your mouth to protect your teeth and to keep you from biting on the endoscope.  You will be asked to lie on your left side.  The endoscope will be lowered down your throat into your esophagus, stomach, and duodenum.  Air will be put into the endoscope. This will help your health care provider see better.  The lining of your esophagus, stomach, and duodenum will be examined.  Your health care provider may: ? Take a tissue sample so it can be looked at in a lab (biopsy). ? Remove growths. ? Remove objects (foreign bodies) that are stuck. ? Treat any bleeding with medicines or other devices that stop tissue from bleeding. ? Widen (dilate) or stretch narrowed areas of your esophagus and stomach.  The endoscope will be taken out. The procedure may vary among health care providers and hospitals. What happens after the procedure?  Your blood pressure, heart rate, breathing rate, and blood oxygen level will be monitored often until the medicines you were given have worn off.  Do not eat or drink anything until the numbing medicine has worn off and your gag reflex has returned. This information is not intended to replace advice given to you by your health care provider. Make sure you discuss any questions you  have with your health care provider. Document Released: 07/05/2004 Document Revised: 08/10/2015 Document Reviewed: 01/26/2015 Elsevier Interactive Patient Education  2018 [employer]. Esophageal Dilatation Esophageal dilatation is a procedure to open a blocked or narrowed part of the esophagus. The esophagus is the long tube in your throat that carries food and liquid from your mouth to your stomach. The procedure is also called esophageal dilation. You may need this procedure if you have a buildup of scar tissue in your esophagus that makes it difficult, painful, or even impossible to swallow. This can be caused by gastroesophageal reflux disease (GERD). In rare cases, people need this procedure because they have cancer of the esophagus or a problem with the way food moves through the esophagus. Sometimes you may need to have another dilatation to enlarge the opening of the esophagus gradually. Tell a health care provider about:  Any allergies you have.  All medicines you are taking, including vitamins, herbs, eye drops, creams, and over-the-counter medicines.  Any problems you or family members have had with anesthetic medicines.  Any blood disorders you have.  Any surgeries you have  had.  Any medical conditions you have.  Any antibiotic medicines you are required to take before dental procedures. What are the risks? Generally, this is a safe procedure. However, problems can occur and include:  Bleeding from a tear in the lining of the esophagus.  A hole (perforation) in the esophagus.  What happens before the procedure?  Do not eat or drink anything after midnight on the night before the procedure or as directed by your health care provider.  Ask your health care provider about changing or stopping your regular medicines. This is especially important if you are taking diabetes medicines or blood thinners.  Plan to have someone take you home after the procedure. What happens  during the procedure?  You will be given a medicine that makes you relaxed and sleepy (sedative).  A medicine may be sprayed or gargled to numb the back of the throat.  Your health care provider can use various instruments to do an esophageal dilatation. During the procedure, the instrument used will be placed in your mouth and passed down into your esophagus. Options include: ? Simple dilators. This instrument is carefully placed in the esophagus to stretch it. ? Guided wire bougies. In this method, a flexible tube (endoscope) is used to insert a wire into the esophagus. The dilator is passed over this wire to enlarge the esophagus. Then the wire is removed. ? Balloon dilators. An endoscope with a small balloon at the end is passed down into the esophagus. Inflating the balloon gently stretches the esophagus and opens it up. What happens after the procedure?  Your blood pressure, heart rate, breathing rate, and blood oxygen level will be monitored often until the medicines you were given have worn off.  Your throat may feel slightly sore and will probably still feel numb. This will improve slowly over time.  You will not be allowed to eat or drink until the throat numbness has resolved.  If this is a same-day procedure, you may be allowed to go home once you have been able to drink, urinate, and sit on the edge of the bed without nausea or dizziness.  If this is a same-day procedure, you should have a friend or family member with you for the next 24 hours after the procedure. This information is not intended to replace advice given to you by your health care provider. Make sure you discuss any questions you have with your health care provider. Document Released: 04/25/2005 Document Revised: 08/10/2015 Document Reviewed: 07/14/2013 Elsevier Interactive Patient Education  2017 [employer].

## 2016-10-10 NOTE — Telephone Encounter (Signed)
Pt is calling back to speak with Hinton Dyer, she would like a call back re: her medication

## 2016-10-10 NOTE — Telephone Encounter (Signed)
[place]. Cancelled rx keppra sent to their pharmacy. Spoke with [place]. Advised rx being sent to different pharmacy.   Awaiting signature from MD on rx to send to pt assistance

## 2016-10-10 NOTE — Telephone Encounter (Addendum)
Patient is going through patient assistance program [employer] Prescription assistance . Telephone [phone] - fax [phone] . For Keppra 500 1 tablet two times per day . Thanks  Hinton Dyer .

## 2016-10-10 NOTE — Telephone Encounter (Signed)
Hinton Dyer- they would also like application faxed, thank you!

## 2016-10-10 NOTE — Progress Notes (Signed)
Reason for visit: Fibromyalgia, blackout events  Claire [name] is an 46 y.o. female  History of present illness:  [name] is a 46 year old left-handed Claire [name] female with a history of chronic pain associated with fibromyalgia, she has a history of headaches and a panic disorder. 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 The patient is on Keppra, she has not had any blackouts since last seen. The patient is followed through the [facility] and she is getting some trigger point injections with some benefit. The patient is in physical therapy currently. She is not working. The patient is on Savella and Effexor. She takes tizanidine and she has hydrocodone to take for pain. She believes that the [facility] has helped her discomfort.  Past Medical History:  Diagnosis Date  . Acid reflux   . Allergy    pollen, dust  . Anxiety   . Asthma   . Carpal tunnel syndrome    bilateral  . DDD (degenerative disc disease), cervical   . Depression   . Fibromyalgia   . Hypertension   . Migraine   . Palpitations   . Panic attacks   . Seizures ([place])    unknown etiology; last seizure was 2 years ago; on meds.  . Syncope and collapse   . Tennis elbow   . Ulcer   . Vertigo     Past Surgical History:  Procedure Laterality Date  . BACK SURGERY  1993  . CHOLECYSTECTOMY    . GALLBLADDER SURGERY    . SHOULDER SURGERY Right   . SPINE SURGERY     lumbar disc surgery    Family History  Problem Relation Age of Onset  . COPD Mother   . Bronchitis Mother   . Arthritis Mother   . Depression Mother   . Heart disease Mother   . Hypertension Mother   . Cancer - Colon Maternal Grandmother   . Alcohol abuse Father   . Early death Father        GSW  . PKU Son     Social history:  reports that she has never smoked. She has never used smokeless tobacco. She reports that she drinks alcohol. She reports that she does not  use drugs.    Allergies  Allergen Reactions  . Baclofen Shortness Of Breath    Swollen ankles  . Claritin [Loratadine] Other (See Comments)    shaking  . Cymbalta [Duloxetine Hcl] Hives    "Hives, forgot who I was"  . Penicillins Hives    Has patient had a PCN reaction causing immediate rash, facial/tongue/throat swelling, SOB or lightheadedness with hypotension: Unknown Has patient had a PCN reaction causing severe rash involving mucus membranes or skin necrosis: Yes Has patient had a PCN reaction that required hospitalization: Unknown Has patient had a PCN reaction occurring within the last 10 years: Unknown If all of the above answers are "NO", then may proceed with Cephalosporin use.  . Sulfa Antibiotics Hives  . Ultram [Tramadol] Other (See Comments)    Dizzy    Medications:  Prior to Admission medications   Medication Sig Start Date End Date Taking? Authorizing Provider  albuterol (PROVENTIL HFA;VENTOLIN HFA) 108 (90 Base) MCG/ACT inhaler Inhale 1-2 puffs into the lungs every 6 (six) hours as needed for wheezing or shortness of breath.    Yes [provider]  carboxymethylcellulose (REFRESH PLUS) 0.5 % SOLN Place 1 drop  into both eyes 2 (two) times daily.   Yes [provider]  diphenhydrAMINE (BENADRYL) 25 MG tablet Take 50 mg by mouth 2 (two) times daily.   Yes [provider]  HYDROcodone-acetaminophen (NORCO/VICODIN) 5-325 MG tablet 1 tablet every 6 (six) hours as needed (for pain.). Pain DR 08/16/16  Yes [provider]  hydrOXYzine (ATARAX/VISTARIL) 25 MG tablet Take 1 tablet (25 mg total) by mouth 3 (three) times daily as needed. 08/14/16  Yes Raylene Everts, MD  levETIRAcetam (KEPPRA) 500 MG tablet Take 1 tablet (500 mg total) by mouth 2 (two) times daily. 05/15/15  Yes Dennie Bible, NP  linaclotide [facility]) 72 MCG capsule Take 1 capsule (72 mcg total) by mouth daily before breakfast. 07/18/16  Yes Carlis Stable, NP  metoprolol  succinate (TOPROL-XL) 25 MG 24 hr tablet Take 1 tablet (25 mg total) by mouth daily. 09/13/16  Yes Raylene Everts, MD  Milnacipran (SAVELLA) 50 MG TABS tablet Take 50 mg by mouth 2 (two) times daily.   Yes [provider]  mirtazapine (REMERON) 15 MG tablet Take 1 tablet (15 mg total) by mouth at bedtime. 09/16/16  Yes Norman Clay, MD  Multiple Vitamin (MULTIVITAMIN WITH MINERALS) TABS tablet Take 2 tablets by mouth daily.    Yes [provider]  omeprazole (PRILOSEC) 40 MG capsule Take 1 capsule (40 mg total) by mouth daily. 10/07/16  Yes Raylene Everts, MD  Potassium 99 MG TABS Take 198 mg by mouth daily.    Yes [provider]  Probiotic CAPS Take 2 capsules by mouth daily.    Yes [provider]  tiZANidine (ZANAFLEX) 2 MG tablet Take 2 mg by mouth 3 (three) times daily. 09/30/16  Yes [provider]  traZODone (DESYREL) 50 MG tablet Take 1 tablet (50 mg total) by mouth at bedtime as needed for sleep. Patient taking differently: Take 50 mg by mouth at bedtime.  09/16/16  Yes Norman Clay, MD  venlafaxine XR (EFFEXOR-XR) 37.5 MG 24 hr capsule Take 1 capsule (37.5 mg total) by mouth daily with breakfast. Total of 112.5 mg (75+37.5mg ) daily 09/16/16  Yes Hisada, Elie Goody, MD  venlafaxine XR (EFFEXOR-XR) 75 MG 24 hr capsule Take 1 capsule (75 mg total) by mouth daily with breakfast. Total of 112.5 mg (75+37.5mg ) daily 09/16/16  Yes Hisada, Elie Goody, MD    ROS:  Out of a complete 14 system review of symptoms, the patient complains only of the following symptoms, and all other reviewed systems are negative.  Weight gain, excessive sweating Ringing in the ears, trouble swallowing Eye itching Wheezing, shortness of breath Snoring Joint pain, joint swelling, back pain, achy muscles, muscle cramps, walking difficulty, neck pain, neck stiffness Moles, itching Headache, seizures, tremor Depression, anxiety  Blood pressure (!) 139/96, pulse 90, height 5\' 4"   (1.626 m), weight 140 lb (63.5 kg).  Physical Exam  General: The patient is alert and cooperative at the time of the examination. The patient is moderately obese.  Skin: No significant peripheral edema is noted.   Neurologic Exam  Mental status: The patient is alert and oriented x 3 at the time of the examination. The patient has apparent normal recent and remote memory, with an apparently normal attention span and concentration ability.   Cranial nerves: Facial symmetry is present. Speech is normal, no aphasia or dysarthria is noted. Extraocular movements are full. Visual fields are full.  Motor: The patient has good strength in all 4 extremities.  Sensory examination: Soft  touch sensation is decreased on the right face, arm, and leg.  Coordination: The patient has good finger-nose-finger and heel-to-shin bilaterally.  Gait and station: The patient has a normal gait. The patient usually walks with a cane. Tandem gait is unsteady. Romberg is negative. No drift is seen.  Reflexes: Deep tendon reflexes are symmetric.   Assessment/Plan:  1. Fibromyalgia  2. Right hemisensory deficit, nonorganic  3. History of seizures  The patient is currently followed through a pain center, she will be continued on the [place] and the [place]. She will follow-up in one year, sooner if needed.  Jill Alexanders MD 10/10/2016 2:23 PM  [facility] [address] [place], [place] [address]  Phone [phone] Fax [phone]

## 2016-10-10 NOTE — Telephone Encounter (Signed)
Faxed signed rx keppra qty 180, 3 refills to [phone]. Received confirmation.

## 2016-10-11 DIAGNOSIS — M25511 Pain in right shoulder: Secondary | ICD-10-CM | POA: Diagnosis not present

## 2016-10-11 DIAGNOSIS — M7551 Bursitis of right shoulder: Secondary | ICD-10-CM | POA: Diagnosis not present

## 2016-10-14 ENCOUNTER — Encounter (HOSPITAL_COMMUNITY)
Admission: RE | Admit: 2016-10-14 | Discharge: 2016-10-14 | Disposition: A | Discharge status: Home / Self Care | Payer: Medicare Other | Source: Ambulatory Visit | Attending: Internal Medicine | Admitting: Internal Medicine

## 2016-10-14 ENCOUNTER — Encounter (HOSPITAL_COMMUNITY): Payer: Self-pay

## 2016-10-14 DIAGNOSIS — R1310 Dysphagia, unspecified: Secondary | ICD-10-CM | POA: Diagnosis not present

## 2016-10-14 DIAGNOSIS — K219 Gastro-esophageal reflux disease without esophagitis: Secondary | ICD-10-CM | POA: Diagnosis not present

## 2016-10-14 DIAGNOSIS — Z01812 Encounter for preprocedural laboratory examination: Secondary | ICD-10-CM | POA: Insufficient documentation

## 2016-10-14 LAB — CBC
HCT: 42.8 % (ref 36.0–46.0)
Hemoglobin: 15 g/dL (ref 12.0–15.0)
MCH: 33.3 pg (ref 26.0–34.0)
MCHC: 35 g/dL (ref 30.0–36.0)
MCV: 95.1 fL (ref 78.0–100.0)
Platelets: 430 10*3/uL — ABNORMAL HIGH (ref 150–400)
RBC: 4.5 MIL/uL (ref 3.87–5.11)
RDW: 12.6 % (ref 11.5–15.5)
WBC: 6.3 10*3/uL (ref 4.0–10.5)

## 2016-10-14 LAB — BASIC METABOLIC PANEL
Anion gap: 11 (ref 5–15)
BUN: 8 mg/dL (ref 6–20)
CO2: 27 mmol/L (ref 22–32)
Calcium: 9.4 mg/dL (ref 8.9–10.3)
Chloride: 106 mmol/L (ref 101–111)
Creatinine, Ser: 0.91 mg/dL (ref 0.44–1.00)
GFR calc Af Amer: >60 mL/min (ref >60)
GFR calc non Af Amer: >60 mL/min (ref >60)
Glucose, Bld: 86 mg/dL (ref 65–99)
Potassium: 3.5 mmol/L (ref 3.5–5.1)
Sodium: 144 mmol/L (ref 135–145)

## 2016-10-14 LAB — HCG, SERUM, QUALITATIVE: Preg, Serum: NEGATIVE

## 2016-10-14 NOTE — Progress Notes (Signed)
   10/14/16 1300  OBSTRUCTIVE SLEEP APNEA  Have you ever been diagnosed with sleep apnea through a sleep study? No  Do you snore loudly (loud enough to be heard through closed doors)?  1  Do you often feel tired, fatigued, or sleepy during the daytime (such as falling asleep during driving or talking to someone)? 1  Has anyone observed you stop breathing during your sleep? 1  Do you have, or are you being treated for high blood pressure? 1  BMI more than 35 kg/m2? 0  Age > 50 (1-yes) 0  Neck circumference greater than:Female 16 inches or larger, Female 17inches or larger? 0  Female Gender (Yes=1) 0  Obstructive Sleep Apnea Score 4  Score 5 or greater  Results sent to PCP
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
## 2016-10-16 NOTE — Telephone Encounter (Signed)
Spoke to patient and relayed everything has been faxed on our end. Relayed to Patient to call Health Dept. I received ok conformation. Patient will only call us back if she needs [place].

## 2016-10-17 ENCOUNTER — Encounter (HOSPITAL_COMMUNITY): Payer: Self-pay | Admitting: Psychiatry

## 2016-10-17 ENCOUNTER — Ambulatory Visit (INDEPENDENT_AMBULATORY_CARE_PROVIDER_SITE_OTHER): Payer: Medicare Other | Admitting: Psychiatry

## 2016-10-17 VITALS — BP 146/106 | HR 82 | Ht 64.0 in | Wt 179.4 lb

## 2016-10-17 DIAGNOSIS — F411 Generalized anxiety disorder: Secondary | ICD-10-CM

## 2016-10-17 DIAGNOSIS — F410 Panic disorder [episodic paroxysmal anxiety] without agoraphobia: Secondary | ICD-10-CM

## 2016-10-17 DIAGNOSIS — Z818 Family history of other mental and behavioral disorders: Secondary | ICD-10-CM

## 2016-10-17 DIAGNOSIS — F99 Mental disorder, not otherwise specified: Secondary | ICD-10-CM | POA: Diagnosis not present

## 2016-10-17 DIAGNOSIS — F5105 Insomnia due to other mental disorder: Secondary | ICD-10-CM

## 2016-10-17 DIAGNOSIS — Z811 Family history of alcohol abuse and dependence: Secondary | ICD-10-CM

## 2016-10-17 MED ORDER — ESCITALOPRAM OXALATE 20 MG PO TABS: ORAL_TABLET | ORAL | 1 refills | Status: DC

## 2016-10-17 MED ORDER — MIRTAZAPINE 15 MG PO TABS: 15.0000 mg | ORAL_TABLET | Freq: Every day | ORAL | 1 refills | Status: DC

## 2016-10-17 MED ORDER — TRAZODONE HCL 50 MG PO TABS: 50.0000 mg | ORAL_TABLET | Freq: Every evening | ORAL | 1 refills | Status: DC | PRN

## 2016-10-17 NOTE — Patient Instructions (Signed)
1. Continue mirtazapine 15 mg at night 2. Decrease Effexor 75 mg daily for one week, then 37.5 mg daily for one week, then discontinue  3. Start lexapro 10 mg daily for two weeks, then 20 mg daily 4. Continue Trazodone 50 mg at night as needed for sleep 5. Return to clinic in five weeks for 30 mins

## 2016-10-18 DIAGNOSIS — G5601 Carpal tunnel syndrome, right upper limb: Secondary | ICD-10-CM | POA: Diagnosis not present

## 2016-10-18 DIAGNOSIS — M47817 Spondylosis without myelopathy or radiculopathy, lumbosacral region: Secondary | ICD-10-CM | POA: Diagnosis not present

## 2016-10-18 DIAGNOSIS — G5602 Carpal tunnel syndrome, left upper limb: Secondary | ICD-10-CM | POA: Diagnosis not present

## 2016-10-18 DIAGNOSIS — G894 Chronic pain syndrome: Secondary | ICD-10-CM | POA: Diagnosis not present

## 2016-10-21 ENCOUNTER — Encounter (HOSPITAL_COMMUNITY)
Admission: RE | Disposition: A | Discharge status: Home / Self Care | Payer: Self-pay | Source: Ambulatory Visit | Attending: Internal Medicine

## 2016-10-21 ENCOUNTER — Ambulatory Visit (HOSPITAL_COMMUNITY)
Admission: RE | Admit: 2016-10-21 | Discharge: 2016-10-21 | Disposition: A | Discharge status: Home / Self Care | Payer: Medicare Other | Source: Ambulatory Visit | Attending: Internal Medicine | Admitting: Internal Medicine

## 2016-10-21 ENCOUNTER — Ambulatory Visit (HOSPITAL_COMMUNITY): Payer: Medicare Other | Admitting: Anesthesiology

## 2016-10-21 ENCOUNTER — Encounter (HOSPITAL_COMMUNITY): Payer: Self-pay | Admitting: Anesthesiology

## 2016-10-21 DIAGNOSIS — Z79891 Long term (current) use of opiate analgesic: Secondary | ICD-10-CM | POA: Insufficient documentation

## 2016-10-21 DIAGNOSIS — J45909 Unspecified asthma, uncomplicated: Secondary | ICD-10-CM | POA: Insufficient documentation

## 2016-10-21 DIAGNOSIS — F419 Anxiety disorder, unspecified: Secondary | ICD-10-CM | POA: Insufficient documentation

## 2016-10-21 DIAGNOSIS — K2950 Unspecified chronic gastritis without bleeding: Secondary | ICD-10-CM | POA: Insufficient documentation

## 2016-10-21 DIAGNOSIS — Z8261 Family history of arthritis: Secondary | ICD-10-CM | POA: Diagnosis not present

## 2016-10-21 DIAGNOSIS — Z880 Allergy status to penicillin: Secondary | ICD-10-CM | POA: Diagnosis not present

## 2016-10-21 DIAGNOSIS — K228 Other specified diseases of esophagus: Secondary | ICD-10-CM | POA: Diagnosis not present

## 2016-10-21 DIAGNOSIS — Z9049 Acquired absence of other specified parts of digestive tract: Secondary | ICD-10-CM | POA: Diagnosis not present

## 2016-10-21 DIAGNOSIS — Z882 Allergy status to sulfonamides status: Secondary | ICD-10-CM | POA: Diagnosis not present

## 2016-10-21 DIAGNOSIS — M797 Fibromyalgia: Secondary | ICD-10-CM | POA: Diagnosis not present

## 2016-10-21 DIAGNOSIS — R1310 Dysphagia, unspecified: Secondary | ICD-10-CM

## 2016-10-21 DIAGNOSIS — F329 Major depressive disorder, single episode, unspecified: Secondary | ICD-10-CM | POA: Diagnosis not present

## 2016-10-21 DIAGNOSIS — G5603 Carpal tunnel syndrome, bilateral upper limbs: Secondary | ICD-10-CM | POA: Insufficient documentation

## 2016-10-21 DIAGNOSIS — K259 Gastric ulcer, unspecified as acute or chronic, without hemorrhage or perforation: Secondary | ICD-10-CM | POA: Insufficient documentation

## 2016-10-21 DIAGNOSIS — B9681 Helicobacter pylori [H. pylori] as the cause of diseases classified elsewhere: Secondary | ICD-10-CM | POA: Diagnosis not present

## 2016-10-21 DIAGNOSIS — Z836 Family history of other diseases of the respiratory system: Secondary | ICD-10-CM | POA: Diagnosis not present

## 2016-10-21 DIAGNOSIS — Z79899 Other long term (current) drug therapy: Secondary | ICD-10-CM | POA: Diagnosis not present

## 2016-10-21 DIAGNOSIS — Z7951 Long term (current) use of inhaled steroids: Secondary | ICD-10-CM | POA: Insufficient documentation

## 2016-10-21 DIAGNOSIS — Z8279 Family history of other congenital malformations, deformations and chromosomal abnormalities: Secondary | ICD-10-CM | POA: Insufficient documentation

## 2016-10-21 DIAGNOSIS — I10 Essential (primary) hypertension: Secondary | ICD-10-CM | POA: Diagnosis not present

## 2016-10-21 DIAGNOSIS — Z818 Family history of other mental and behavioral disorders: Secondary | ICD-10-CM | POA: Insufficient documentation

## 2016-10-21 DIAGNOSIS — R569 Unspecified convulsions: Secondary | ICD-10-CM | POA: Insufficient documentation

## 2016-10-21 DIAGNOSIS — Z811 Family history of alcohol abuse and dependence: Secondary | ICD-10-CM | POA: Insufficient documentation

## 2016-10-21 DIAGNOSIS — Z8249 Family history of ischemic heart disease and other diseases of the circulatory system: Secondary | ICD-10-CM | POA: Insufficient documentation

## 2016-10-21 DIAGNOSIS — K219 Gastro-esophageal reflux disease without esophagitis: Secondary | ICD-10-CM | POA: Diagnosis not present

## 2016-10-21 DIAGNOSIS — Z888 Allergy status to other drugs, medicaments and biological substances status: Secondary | ICD-10-CM | POA: Insufficient documentation

## 2016-10-21 DIAGNOSIS — Z9889 Other specified postprocedural states: Secondary | ICD-10-CM | POA: Diagnosis not present

## 2016-10-21 HISTORY — PX: ESOPHAGOGASTRODUODENOSCOPY (EGD) WITH PROPOFOL: SHX5813

## 2016-10-21 HISTORY — PX: BIOPSY: SHX5522

## 2016-10-21 SURGERY — ESOPHAGOGASTRODUODENOSCOPY (EGD) WITH PROPOFOL
Anesthesia: Monitor Anesthesia Care

## 2016-10-21 MED ORDER — LIDOCAINE VISCOUS 2 % MT SOLN
3.0000 mL | Freq: Once | OROMUCOSAL | Status: AC
  Administered 2016-10-21: 3 mL via OROMUCOSAL

## 2016-10-21 MED ORDER — PROPOFOL 500 MG/50ML IV EMUL
INTRAVENOUS | Status: DC | PRN
  Administered 2016-10-21: 125 ug/kg/min via INTRAVENOUS
  Administered 2016-10-21: 10:00:00 via INTRAVENOUS

## 2016-10-21 MED ORDER — MIDAZOLAM HCL 2 MG/2ML IJ SOLN
INTRAMUSCULAR | Status: AC
  Filled 2016-10-21: qty 2

## 2016-10-21 MED ORDER — LIDOCAINE HCL (PF) 1 % IJ SOLN
INTRAMUSCULAR | Status: AC
  Filled 2016-10-21: qty 5

## 2016-10-21 MED ORDER — MIDAZOLAM HCL 5 MG/5ML IJ SOLN
INTRAMUSCULAR | Status: DC | PRN
  Administered 2016-10-21: 2 mg via INTRAVENOUS

## 2016-10-21 MED ORDER — ONDANSETRON HCL 4 MG/2ML IJ SOLN
INTRAMUSCULAR | Status: AC
  Filled 2016-10-21: qty 2

## 2016-10-21 MED ORDER — LIDOCAINE VISCOUS 2 % MT SOLN
OROMUCOSAL | Status: AC
  Filled 2016-10-21: qty 15

## 2016-10-21 MED ORDER — LACTATED RINGERS IV SOLN
INTRAVENOUS | Status: DC
  Administered 2016-10-21: 1000 mL via INTRAVENOUS

## 2016-10-21 MED ORDER — IPRATROPIUM-ALBUTEROL 0.5-2.5 (3) MG/3ML IN SOLN
3.0000 mL | Freq: Once | RESPIRATORY_TRACT | Status: AC
  Administered 2016-10-21: 3 mL via RESPIRATORY_TRACT

## 2016-10-21 MED ORDER — FENTANYL CITRATE (PF) 100 MCG/2ML IJ SOLN
25.0000 ug | Freq: Once | INTRAMUSCULAR | Status: AC
  Administered 2016-10-21: 25 ug via INTRAVENOUS

## 2016-10-21 MED ORDER — PROPOFOL 10 MG/ML IV BOLUS
INTRAVENOUS | Status: AC
  Filled 2016-10-21: qty 40

## 2016-10-21 MED ORDER — CHLORHEXIDINE GLUCONATE CLOTH 2 % EX PADS: 6.0000 | MEDICATED_PAD | Freq: Once | CUTANEOUS | Status: DC

## 2016-10-21 MED ORDER — IPRATROPIUM-ALBUTEROL 0.5-2.5 (3) MG/3ML IN SOLN
RESPIRATORY_TRACT | Status: AC
  Filled 2016-10-21: qty 3

## 2016-10-21 MED ORDER — FENTANYL CITRATE (PF) 100 MCG/2ML IJ SOLN
INTRAMUSCULAR | Status: AC
  Filled 2016-10-21: qty 2

## 2016-10-21 MED ORDER — PROPOFOL 10 MG/ML IV BOLUS
INTRAVENOUS | Status: DC | PRN
  Administered 2016-10-21 (×2): 20 mg via INTRAVENOUS

## 2016-10-21 MED ORDER — ONDANSETRON HCL 4 MG/2ML IJ SOLN
4.0000 mg | Freq: Once | INTRAMUSCULAR | Status: AC
  Administered 2016-10-21: 4 mg via INTRAVENOUS

## 2016-10-21 MED ORDER — MIDAZOLAM HCL 2 MG/2ML IJ SOLN
1.0000 mg | INTRAMUSCULAR | Status: AC
  Administered 2016-10-21 (×2): 2 mg via INTRAVENOUS
  Filled 2016-10-21: qty 2

## 2016-10-21 NOTE — H&P (Signed)
@LOGO @   Primary Care Physician:  Raylene Everts, MD Primary Gastroenterologist:  Dr. Gala Romney  Pre-Procedure History & Physical: HPI:  Claire [name] is a 46 y.o. female here for further evaluation of the GERD and dysphagia. EGD today. Previously scheduled EGD had to be canceled due to a significant dermatitis.  Past Medical History:  Diagnosis Date  . Acid reflux   . Allergy    pollen, dust  . Anxiety   . Asthma   . Carpal tunnel syndrome    bilateral  . DDD (degenerative disc disease), cervical   . Depression   . Fibromyalgia   . Hypertension   . Migraine   . Palpitations   . Panic attacks   . Seizures ([place])    unknown etiology; last seizure was 2 years ago; on meds.  . Syncope and collapse   . Tennis elbow   . Ulcer   . Vertigo     Past Surgical History:  Procedure Laterality Date  . BACK SURGERY  1993  . CHOLECYSTECTOMY    . GALLBLADDER SURGERY    . SHOULDER SURGERY Right   . SPINE SURGERY  1993   lumbar disc surgery    Prior to Admission medications   Medication Sig Start Date End Date Taking? Authorizing Provider  albuterol (PROVENTIL HFA;VENTOLIN HFA) 108 (90 Base) MCG/ACT inhaler Inhale 1-2 puffs into the lungs every 6 (six) hours as needed for wheezing or shortness of breath.    Yes [provider]  carboxymethylcellulose (REFRESH PLUS) 0.5 % SOLN Place 1 drop into both eyes 2 (two) times daily.   Yes [provider]  diphenhydrAMINE (BENADRYL) 25 MG tablet Take 50 mg by mouth 2 (two) times daily.   Yes [provider]  escitalopram (LEXAPRO) 20 MG tablet 10 mg daily for two weeks, then 20 mg daily 10/17/16  Yes Hisada, Elie Goody, MD  HYDROcodone-acetaminophen (NORCO/VICODIN) 5-325 MG tablet Take 1 tablet by mouth as needed for moderate pain.   Yes [provider]  levETIRAcetam (KEPPRA) 500 MG tablet Take 1 tablet (500 mg total) by mouth 2 (two) times daily. 10/10/16  Yes Kathrynn Ducking, MD  linaclotide [facility]) 72 MCG  capsule Take 1 capsule (72 mcg total) by mouth daily before breakfast. 07/18/16  Yes Carlis Stable, NP  metoprolol succinate (TOPROL-XL) 25 MG 24 hr tablet Take 1 tablet (25 mg total) by mouth daily. 09/13/16  Yes Raylene Everts, MD  Milnacipran (SAVELLA) 50 MG TABS tablet Take 50 mg by mouth 2 (two) times daily.   Yes [provider]  mirtazapine (REMERON) 15 MG tablet Take 1 tablet (15 mg total) by mouth at bedtime. 10/17/16  Yes Norman Clay, MD  Multiple Vitamin (MULTIVITAMIN WITH MINERALS) TABS tablet Take 2 tablets by mouth daily.    Yes [provider]  omeprazole (PRILOSEC) 40 MG capsule Take 1 capsule (40 mg total) by mouth daily. 10/07/16  Yes Raylene Everts, MD  Potassium 99 MG TABS Take 198 mg by mouth daily.    Yes [provider]  Probiotic CAPS Take 2 capsules by mouth daily.    Yes [provider]  tiZANidine (ZANAFLEX) 2 MG tablet Take 2 mg by mouth 3 (three) times daily. 09/30/16  Yes [provider]  traZODone (DESYREL) 50 MG tablet Take 1 tablet (50 mg total) by mouth at bedtime as needed for sleep. 10/17/16  Yes Norman Clay, MD  HYDROcodone-acetaminophen (NORCO) 10-325 MG tablet 1 tablet every 6 (six) hours  as needed (for pain.). Pain DR 08/16/16   [provider]  hydrOXYzine (ATARAX/VISTARIL) 25 MG tablet Take 1 tablet (25 mg total) by mouth 3 (three) times daily as needed. 08/14/16   Raylene Everts, MD    Allergies as of 09/02/2016 - Review Complete 08/30/2016  Allergen Reaction Noted  . Claritin [loratadine] Other (See Comments) 07/28/2014  . Cymbalta [duloxetine hcl] Hives 07/29/2012  . Penicillins Hives 07/29/2012  . Sulfa antibiotics Hives 07/29/2012  . Ultram [tramadol] Other (See Comments) 09/12/2014    Family History  Problem Relation Age of Onset  . COPD Mother   . Bronchitis Mother   . Arthritis Mother   . Depression Mother   . Heart disease Mother   . Hypertension Mother   . Cancer - Colon  Maternal Grandmother   . Alcohol abuse Father   . Early death Father        GSW  . PKU Son     Social History   Social History  . Marital status: Single    Spouse name: N/A  . Number of children: 1  . Years of education: HS   Occupational History  . disability     unemployed   Social History Main Topics  . Smoking status: Never Smoker  . Smokeless tobacco: Never Used  . Alcohol use Yes     Comment: drink occasionally  . Drug use: No  . Sexual activity: Not Currently    Partners: Female    Birth control/ protection: None   Other Topics Concern  . Not on file   Social History Narrative   Patient is left handed.   Patient drinks very little caffeine.   Lives alone       Review of Systems: See HPI, otherwise negative ROS  Physical Exam: BP (!) 153/102 (BP Location: Left Arm)   Pulse 92   Temp 98.4 F (36.9 C)   Resp 18   SpO2 98%  General:   Alert,   pleasant and cooperative in NAD Neck:  Supple; no masses or thyromegaly. No significant cervical adenopathy. Lungs:  Clear throughout to auscultation.   No wheezes, crackles, or rhonchi. No acute distress. Heart:  Regular rate and rhythm; no murmurs, clicks, rubs,  or gallops. Abdomen: Non-distended, normal bowel sounds.  Soft and nontender without appreciable mass or hepatosplenomegaly.  Pulses:  Normal pulses noted. Extremities:  Without clubbing or edema.  Impression:  Pleasant 46 year old lady with long-standing GERD well controlled. Intermittent esophageal dysphagia deserves further evaluation.  Recommendations:  I have offered the patient EGD with ED as feasible/appropriate per plan.  The risks, benefits, limitations, alternatives and imponderables have been reviewed with the patient. Potential for esophageal dilation, biopsy, etc. have also been reviewed.  Questions have been answered. All parties agreeable.   Notice: This dictation was prepared with Dragon dictation along with smaller phrase technology.  Any transcriptional errors that result from this process are unintentional and may not be corrected upon review.

## 2016-10-21 NOTE — Anesthesia Preprocedure Evaluation (Signed)
Anesthesia Evaluation  Patient identified by MRN, date of birth, ID band Patient awake    Reviewed: Allergy & Precautions, NPO status , Patient's Chart, lab work & pertinent test results, reviewed documented beta blocker date and time   Airway Mallampati: III  TM Distance: <3 FB   Mouth opening: Limited Mouth Opening Comment: TMJ issues, limited opening Dental  (+) Teeth Intact   Pulmonary asthma ,    breath sounds clear to auscultation       Cardiovascular hypertension, Pt. on home beta blockers and Pt. on medications + dysrhythmias (palpitations)  Rhythm:Regular Rate:Normal     Neuro/Psych  Headaches, Seizures -,  PSYCHIATRIC DISORDERS Anxiety Depression    GI/Hepatic GERD  ,  Endo/Other    Renal/GU      Musculoskeletal  (+) Fibromyalgia -  Abdominal   Peds  Hematology   Anesthesia Other Findings   Reproductive/Obstetrics                             Anesthesia Physical Anesthesia Plan  ASA: III  Anesthesia Plan: MAC   Post-op Pain Management:    Induction: Intravenous  PONV Risk Score and Plan:   Airway Management Planned: Simple Face Mask  Additional Equipment:   Intra-op Plan:   Post-operative Plan:   Informed Consent: I have reviewed the patients History and Physical, chart, labs and discussed the procedure including the risks, benefits and alternatives for the proposed anesthesia with the patient or authorized representative who has indicated his/her understanding and acceptance.     Plan Discussed with:   Anesthesia Plan Comments:         Anesthesia Quick Evaluation

## 2016-10-21 NOTE — Discharge Instructions (Addendum)
EGD Discharge instructions Please read the instructions outlined below and refer to this sheet in the next few weeks. These discharge instructions provide you with general information on caring for yourself after you leave the hospital. Your doctor may also give you specific instructions. While your treatment has been planned according to the most current medical practices available, unavoidable complications occasionally occur. If you have any problems or questions after discharge, please call your doctor. ACTIVITY  You may resume your regular activity but move at a slower pace for the next 24 hours.   Take frequent rest periods for the next 24 hours.   Walking will help expel (get rid of) the air and reduce the bloated feeling in your abdomen.   No driving for 24 hours (because of the anesthesia (medicine) used during the test).   You may shower.   Do not sign any important legal documents or operate any machinery for 24 hours (because of the anesthesia used during the test).  NUTRITION  Drink plenty of fluids.   You may resume your normal diet.   Begin with a light meal and progress to your normal diet.   Avoid alcoholic beverages for 24 hours or as instructed by your caregiver.  MEDICATIONS  You may resume your normal medications unless your caregiver tells you otherwise.  WHAT YOU CAN EXPECT TODAY  You may experience abdominal discomfort such as a feeling of fullness or gas pains.  FOLLOW-UP  Your doctor will discuss the results of your test with you.  SEEK IMMEDIATE MEDICAL ATTENTION IF ANY OF THE FOLLOWING OCCUR:  Excessive nausea (feeling sick to your stomach) and/or vomiting.   Severe abdominal pain and distention (swelling).   Trouble swallowing.   Temperature over 101 F (37.8 C).   Rectal bleeding or vomiting of blood.    Continue omeprazole 40 mg daily  Office visit with [place] in 6 weeks  Further recommendations to follow pending review of pathology  report

## 2016-10-21 NOTE — Anesthesia Postprocedure Evaluation (Signed)
Anesthesia Post Note  Patient: Claire [name]  Procedure(s) Performed: Procedure(s) (LRB): ESOPHAGOGASTRODUODENOSCOPY (EGD) WITH PROPOFOL (N/A) BIOPSY  Patient location during evaluation: Short Stay Anesthesia Type: MAC Level of consciousness: awake and alert and oriented Pain management: pain level controlled Vital Signs Assessment: post-procedure vital signs reviewed and stable Respiratory status: spontaneous breathing Cardiovascular status: blood pressure returned to baseline and stable Postop Assessment: no signs of nausea or vomiting and adequate PO intake     Last Vitals:  Vitals:   10/21/16 1100 10/21/16 1106  BP: (!) 129/96 (!) 142/93  Pulse: 92 90  Resp: 16 18  Temp:  36.6 C    Last Pain:  Vitals:   10/21/16 1106  TempSrc: Oral  PainSc:                  Pinky Ravan

## 2016-10-21 NOTE — Op Note (Signed)
[facility] Patient Name: Claire [name] Procedure Date: 10/21/2016 10:14 AM MRN: [id number] Date of Birth: [birth year] Attending MD: Norvel Richards , MD CSN: [id number] Age: 46 Admit Type: Outpatient Procedure:                Upper GI endoscopy Indications:              Dysphagia Providers:                Norvel Richards, MD, Lurline Del, RN, Randa Spike, Technician Referring MD:              Medicines:                Propofol per Anesthesia Complications:            No immediate complications. Estimated Blood Loss:     Estimated blood loss was minimal. Procedure:                Pre-Anesthesia Assessment:                           - Prior to the procedure, a History and Physical                            was performed, and patient medications and                            allergies were reviewed. The patient's tolerance of                            previous anesthesia was also reviewed. The risks                            and benefits of the procedure and the sedation                            options and risks were discussed with the patient.                            All questions were answered, and informed consent                            was obtained. Prior Anticoagulants: The patient has                            taken no previous anticoagulant or antiplatelet                            agents. ASA Grade Assessment: II - A patient with                            mild systemic disease. After reviewing the risks  and benefits, the patient was deemed in                            satisfactory condition to undergo the procedure.                           After obtaining informed consent, the endoscope was                            passed under direct vision. Throughout the                            procedure, the patient's blood pressure, pulse, and                            oxygen saturations were  monitored continuously. The                            EG-[id number] ([id number]) scope was introduced through the                            and advanced to the second part of duodenum. The                            upper GI endoscopy was accomplished without                            difficulty. The patient tolerated the procedure                            well. Scope In: 10:15:42 AM Scope Out: 10:25:18 AM Total Procedure Duration: 0 hours 9 minutes 36 seconds  Findings:      Crepe paper appearing esophageal mucosa with longitudinal furrows       present. A subtle ringed appearance of mucosa diffusely. Some resistance       to passage of the gastroscope midesophagus with gentle pressure the       scope advanced downstream. No tumor no Barrett's esophagus no reflux       esophagitis seen. Stomach antral erosions. No ulcer or infiltrating       process.Patent pylorus. Normal first and second portion of the duodenum.      cope back into the tubular esophagus where 2 superficial tears were       found midesophagus corresponding to the area of critical narrowing.These       appeared superficial. Biopsies of the mid and distal esophagus taken for       histologic study. Also, biopsies of the antrum taken.      No dedicated dilation attempted. 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 Antral erosions?"status post biopsy.                           - Mucosal changes in the esophagus. Biopsied. Moderate Sedation:      Moderate (conscious) sedation was personally administered by an       anesthesia professional. The following  parameters were monitored: oxygen       saturation, heart rate, blood pressure, respiratory rate, EKG, adequacy       of pulmonary ventilation, and response to care. Total physician       intraservice time was 20 minutes. Recommendation:           - Patient has a contact number available for                            emergencies. The signs and symptoms of potential                             delayed complications were discussed with the                            patient. Return to normal activities tomorrow.                            Written discharge instructions were provided to the                            patient.                           - Resume previous diet.                           - Continue present medications.                           - Await pathology results.                           - Return to my office in 6 months. Procedure Code(s):        --- Professional ---                           [phone], Esophagogastroduodenoscopy, flexible,                            transoral; with biopsy, single or multiple Diagnosis Code(s):        --- Professional ---                           K22.8, Other specified diseases of esophagus                           R13.10, Dysphagia, unspecified CPT copyright 2016 American Medical Association. All rights reserved. The codes documented in this report are preliminary and upon coder review may  be revised to meet current compliance requirements. Cristopher Estimable. Creighton Longley, MD Norvel Richards, MD 10/21/2016 10:40:22 AM This report has been signed electronically. Number of Addenda: 0

## 2016-10-21 NOTE — Transfer of Care (Signed)
Immediate Anesthesia Transfer of Care Note  Patient: Claire [name]  Procedure(s) Performed: Procedure(s) with comments: ESOPHAGOGASTRODUODENOSCOPY (EGD) WITH PROPOFOL (N/A) - 9:30am BIOPSY - gastric, esophagus  Patient Location: PACU  Anesthesia Type:MAC  Level of Consciousness: awake, alert  and oriented  Airway & Oxygen Therapy: Patient Spontanous Breathing  Post-op Assessment: Report given to RN  Post vital signs: Reviewed and stable  Last Vitals:  Vitals:   10/21/16 0950 10/21/16 0955  BP:  (!) 131/94  Pulse:    Resp: 19 15  Temp:      Last Pain:  Vitals:   10/21/16 0815  PainSc: 0-No pain         Complications: No apparent anesthesia complications

## 2016-10-23 ENCOUNTER — Encounter (HOSPITAL_COMMUNITY): Payer: Self-pay | Admitting: Internal Medicine

## 2016-10-23 DIAGNOSIS — M7551 Bursitis of right shoulder: Secondary | ICD-10-CM | POA: Diagnosis not present

## 2016-10-23 DIAGNOSIS — M25511 Pain in right shoulder: Secondary | ICD-10-CM | POA: Diagnosis not present

## 2016-10-25 ENCOUNTER — Encounter: Payer: Self-pay | Admitting: Internal Medicine

## 2016-10-28 ENCOUNTER — Telehealth: Payer: Self-pay

## 2016-10-28 NOTE — Telephone Encounter (Signed)
Per RMR-  Rourk, Cristopher Estimable, MD  Claudina Lick, LPN; Theadora Rama        Send letter to patient.  Send copy of letter with path to referring provider and PCP.  Patient needs Pylera 3 capsules 4 times a day 10 days with no refill. Along with this, omeprazole 20 mg orally twice daily - stay on omeprazole twice a day. Dispense 60 with 3 refills on omeprazole

## 2016-10-28 NOTE — Telephone Encounter (Signed)
I have also sent her a message in mychart.  

## 2016-10-28 NOTE — Telephone Encounter (Signed)
Letter mailed to the pt. 

## 2016-10-29 MED ORDER — OMEPRAZOLE 20 MG PO CPDR: 20.0000 mg | DELAYED_RELEASE_CAPSULE | Freq: Two times a day (BID) | ORAL | 3 refills | Status: DC

## 2016-10-29 MED ORDER — BIS SUBCIT-METRONID-TETRACYC 140-125-125 MG PO CAPS: 3.0000 | ORAL_CAPSULE | Freq: Three times a day (TID) | ORAL | 0 refills | Status: DC

## 2016-10-29 NOTE — Telephone Encounter (Signed)
Pt has reviewed this in mychart.

## 2016-10-29 NOTE — Telephone Encounter (Signed)
Medications sent to [facility], sent mychart message to the pt with instructions on how to take it.

## 2016-11-07 ENCOUNTER — Telehealth: Payer: Self-pay | Admitting: Family Medicine

## 2016-11-07 DIAGNOSIS — Z1239 Encounter for other screening for malignant neoplasm of breast: Secondary | ICD-10-CM

## 2016-11-07 NOTE — Telephone Encounter (Signed)
Patient would like to have a mammogram. She states that breasts have been tender and sore.  She states there is no history of breast cancer or issues within family.  Mammogram and pap smear  is suggested thru My chart.  cb  [phone]
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
## 2016-11-12 ENCOUNTER — Ambulatory Visit (INDEPENDENT_AMBULATORY_CARE_PROVIDER_SITE_OTHER): Payer: Medicare Other

## 2016-11-12 DIAGNOSIS — Z23 Encounter for immunization: Secondary | ICD-10-CM

## 2016-11-20 DIAGNOSIS — G5602 Carpal tunnel syndrome, left upper limb: Secondary | ICD-10-CM | POA: Diagnosis not present

## 2016-11-20 DIAGNOSIS — G5601 Carpal tunnel syndrome, right upper limb: Secondary | ICD-10-CM | POA: Diagnosis not present

## 2016-11-20 DIAGNOSIS — M47817 Spondylosis without myelopathy or radiculopathy, lumbosacral region: Secondary | ICD-10-CM | POA: Diagnosis not present

## 2016-11-20 DIAGNOSIS — G894 Chronic pain syndrome: Secondary | ICD-10-CM | POA: Diagnosis not present

## 2016-11-21 ENCOUNTER — Ambulatory Visit (INDEPENDENT_AMBULATORY_CARE_PROVIDER_SITE_OTHER): Payer: Medicare Other | Admitting: Psychiatry

## 2016-11-21 ENCOUNTER — Encounter (HOSPITAL_COMMUNITY): Payer: Self-pay | Admitting: Psychiatry

## 2016-11-21 VITALS — BP 121/86 | HR 90 | Ht 64.0 in | Wt 180.2 lb

## 2016-11-21 DIAGNOSIS — Z818 Family history of other mental and behavioral disorders: Secondary | ICD-10-CM | POA: Diagnosis not present

## 2016-11-21 DIAGNOSIS — F411 Generalized anxiety disorder: Secondary | ICD-10-CM

## 2016-11-21 DIAGNOSIS — M791 Myalgia: Secondary | ICD-10-CM

## 2016-11-21 DIAGNOSIS — F410 Panic disorder [episodic paroxysmal anxiety] without agoraphobia: Secondary | ICD-10-CM | POA: Diagnosis not present

## 2016-11-21 DIAGNOSIS — G4700 Insomnia, unspecified: Secondary | ICD-10-CM | POA: Diagnosis not present

## 2016-11-21 DIAGNOSIS — Z811 Family history of alcohol abuse and dependence: Secondary | ICD-10-CM

## 2016-11-21 MED ORDER — TRAZODONE HCL 50 MG PO TABS
50.0000 mg | ORAL_TABLET | Freq: Every evening | ORAL | 1 refills | Status: DC | PRN
Start: 2016-11-21 — End: 2016-12-17

## 2016-11-21 MED ORDER — ESCITALOPRAM OXALATE 20 MG PO TABS: ORAL_TABLET | ORAL | 1 refills | Status: DC

## 2016-11-21 MED ORDER — GABAPENTIN 300 MG PO CAPS: ORAL_CAPSULE | ORAL | 1 refills | Status: DC

## 2016-11-21 MED ORDER — MIRTAZAPINE 15 MG PO TABS: 15.0000 mg | ORAL_TABLET | Freq: Every day | ORAL | 1 refills | Status: DC

## 2016-11-21 NOTE — Patient Instructions (Signed)
1. Continue mirtazapine 15 mg at night 2. Decrease Effexor 75 mg daily for one week, then 37.5 mg daily for one week, then discontinue 3. Start lexapro 10 mg daily for two weeks, then 20 mg daily 4. Start gabapentin 300 mg at night,  and 300 mg daily as needed for anxiety 5. Continue Trazodone 50 mg at night as needed for sleep 6. Return to clinic in one month for 30 mins

## 2016-12-02 ENCOUNTER — Ambulatory Visit (INDEPENDENT_AMBULATORY_CARE_PROVIDER_SITE_OTHER): Payer: Medicare Other | Admitting: Nurse Practitioner

## 2016-12-02 ENCOUNTER — Encounter: Payer: Self-pay | Admitting: Nurse Practitioner

## 2016-12-02 VITALS — BP 132/95 | HR 106 | Temp 97.3°F | Ht 64.0 in | Wt 177.6 lb

## 2016-12-02 DIAGNOSIS — K219 Gastro-esophageal reflux disease without esophagitis: Secondary | ICD-10-CM

## 2016-12-02 DIAGNOSIS — R1310 Dysphagia, unspecified: Secondary | ICD-10-CM | POA: Diagnosis not present

## 2016-12-02 DIAGNOSIS — K5900 Constipation, unspecified: Secondary | ICD-10-CM

## 2016-12-02 DIAGNOSIS — K200 Eosinophilic esophagitis: Secondary | ICD-10-CM

## 2016-12-02 DIAGNOSIS — A048 Other specified bacterial intestinal infections: Secondary | ICD-10-CM | POA: Diagnosis not present

## 2016-12-02 NOTE — Assessment & Plan Note (Signed)
GERD currently well managed on PPI. Recent EGD with some erosions but no ulcerations. Positive for H. pylori which was treated. Continue per prescription medications. I will have her hold her PPI for 2 weeks and we can check for eradication as per below. Otherwise, continue current prescription medications. Return for follow-up in 6 months.

## 2016-12-02 NOTE — Assessment & Plan Note (Signed)
Dysphagia likely due to eosinophilic esophagitis. She is currently asymptomatic as per above. No further dysphagia. Continue to monitor, return for follow-up in 6 months.

## 2016-12-02 NOTE — Assessment & Plan Note (Signed)
Surgical pathology of her esophagus positive for eosinophilic esophagitis. She is currently asymptomatic. No dysphagia since her EGD. We'll continue with acid suppression his primary line treatment. She develops symptoms we can prescribe fluticasone 2 sprays twice a day to be swallowed, not inhaled, to help. Return for follow-up in 6 months. Call with any worsening symptoms before then.

## 2016-12-02 NOTE — Patient Instructions (Signed)
1. I have ordered your breath test. 2. You should have his completed October 2. 3. We will give you guidance on what medications he can and cannot use leading up to your breath test. 4. If your symptoms become too severe without your acid blocker, call us and let us know. 5. Return for follow-up in 6 months. 6. Call if you have any worsening or returning symptoms before then. 7. Call if you have any questions or concerns.

## 2016-12-02 NOTE — Progress Notes (Signed)
Referring Provider: Raylene Everts, MD Primary Care Physician:  Raylene Everts, MD Primary GI:  Dr. Gala Romney  Chief Complaint  Patient presents with  . Abdominal Pain    HPI:   Claire [name] is a 46 y.o. female who presents for abdominal pain and constipation. The patient was last seen in our office 08/30/2016 for GERD, constipation, dysphagia. History of chronic GERD since teenage years. Her previously scheduled endoscopy had to be canceled because of bilateral dermatitis. At her last office visit she was doing okay, had not picked up her Linzess prescription because she didn't note was at the pharmacy and she was made aware. Her blisters are much improved and primary care and thinks she might have picked up poison ivy. No bowel movement in 4 days. GERD symptoms improved on Prilosec. Persistent dysphagia with solid foods, some pills. No other GI symptoms. Previously Linzess worked well for her at a dose of 72 g once a day. Recommended she start Linzess, she was given samples to last until she can get to the pharmacy, reschedule endoscopy on propofol/MAC due to polypharmacy and chronic pain.  EGD was completed 10/21/2016 which found crpe paper appearing esophageal mucosa with longitudinal furrows and a subtle ringed appearance of mucosa diffusely. No tumor or Barrett's esophagus, no reflux esophagitis. Stomach with antral erosions but no ulcers, patent pylorus, normal duodenum. 2 superficial tears were found in the mid esophagus corresponding to the area of critical narrowing. Biopsies were taking of the mid and distal esophagus for pathology. 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  Surgical pathology labs reviewed. Stomach biopsy found H. pylori. Esophageal biopsy found squamous mucosa with marked increase in epithelial eosinophils. She was prescribed Pylera and omeprazole for H. pylori.  Today she states she's doing better overall. Completed H. Pylori treatment. Swallowing is improved, none since EGD. Denies  abdominal pain, N/V, hematochezia, melena, fever, unintentional weight loss. Constipation is doing well on Linzess. Has a bowel movement about 3-4 times a day which is soft and sometimes runny, is satisfied with how she's going now; "I haven't been this regular in a long while." Has dyspnea related to asthma but is at baseline. Denies chest pain, dizziness, lightheadedness, syncope, near syncope. Denies any other upper or lower GI symptoms.  Past Medical History:  Diagnosis Date  . Acid reflux   . Allergy    pollen, dust  . Anxiety   . Arthritis   . Asthma   . Carpal tunnel syndrome    bilateral  . DDD (degenerative disc disease), cervical   . Depression   . Fibromyalgia   . Hypertension   . Migraine   . Palpitations   . Panic attacks   . Seizures ([place])    unknown etiology; last seizure was 2 years ago; on meds.  . Syncope and collapse   . Tennis elbow   . Ulcer   . Vertigo     Past Surgical History:  Procedure Laterality Date  . BACK SURGERY  1993  . BIOPSY  10/21/2016   Procedure: BIOPSY;  Surgeon: Daneil Dolin, MD;  [facility];  Service: Endoscopy;;  gastric, esophagus  . CHOLECYSTECTOMY    . ESOPHAGOGASTRODUODENOSCOPY (EGD) WITH PROPOFOL N/A 10/21/2016   Procedure: ESOPHAGOGASTRODUODENOSCOPY (EGD) WITH PROPOFOL;  Surgeon: Daneil Dolin, MD;  [facility];  Service: Endoscopy;  Laterality: N/A;  9:30am  . GALLBLADDER SURGERY    . SHOULDER SURGERY Right   . SPINE SURGERY  1993   lumbar disc  surgery    Current Outpatient Prescriptions  Medication Sig Dispense Refill  . albuterol (PROVENTIL HFA;VENTOLIN HFA) 108 (90 Base) MCG/ACT inhaler Inhale 1-2 puffs into the lungs every 6 (six) hours as needed for wheezing or shortness of breath.     . carboxymethylcellulose (REFRESH PLUS) 0.5 % SOLN Place 1 drop into both eyes 2 (two) times daily.    . diphenhydrAMINE (BENADRYL) 25 MG tablet Take 50 mg by mouth 2 (two) times daily.    Marland Kitchen escitalopram  (LEXAPRO) 20 MG tablet 10 mg daily for two weeks, then 20 mg daily 30 tablet 1  . gabapentin (NEURONTIN) 300 MG capsule 300 mg at night and 300 mg daily as needed for anxiety 60 capsule 1  . HYDROcodone-acetaminophen (NORCO/VICODIN) 5-325 MG tablet Take 1 tablet by mouth as needed for moderate pain.    Marland Kitchen levETIRAcetam (KEPPRA) 500 MG tablet Take 1 tablet (500 mg total) by mouth 2 (two) times daily. 180 tablet 3  . linaclotide (LINZESS) 72 MCG capsule Take 1 capsule (72 mcg total) by mouth daily before breakfast. 90 capsule 2  . metoprolol succinate (TOPROL-XL) 25 MG 24 hr tablet Take 1 tablet (25 mg total) by mouth daily. 90 tablet 3  . Milnacipran (SAVELLA) 50 MG TABS tablet Take 50 mg by mouth 2 (two) times daily.    . mirtazapine (REMERON) 15 MG tablet Take 1 tablet (15 mg total) by mouth at bedtime. 30 tablet 1  . Multiple Vitamin (MULTIVITAMIN WITH MINERALS) TABS tablet Take 2 tablets by mouth daily.     Marland Kitchen omeprazole (PRILOSEC) 20 MG capsule Take 1 capsule (20 mg total) by mouth 2 (two) times daily. 60 capsule 3  . omeprazole (PRILOSEC) 40 MG capsule Take 1 capsule (40 mg total) by mouth daily. 90 capsule 1  . Potassium 99 MG TABS Take 198 mg by mouth daily.     . Probiotic CAPS Take 2 capsules by mouth daily.     Marland Kitchen tiZANidine (ZANAFLEX) 2 MG tablet Take 2 mg by mouth 3 (three) times daily.    . traZODone (DESYREL) 50 MG tablet Take 1 tablet (50 mg total) by mouth at bedtime as needed for sleep. 30 tablet 1   No current facility-administered medications for this visit.     Allergies as of 12/02/2016 - Review Complete 12/02/2016  Allergen Reaction Noted  . Baclofen Shortness Of Breath 10/10/2016  . Claritin [loratadine] Other (See Comments) 07/28/2014  . Cymbalta [duloxetine hcl] Hives 07/29/2012  . Penicillins Hives 07/29/2012  . Sulfa antibiotics Hives 07/29/2012  . Ultram [tramadol] Other (See Comments) 09/12/2014    Family History  Problem Relation Age of Onset  . COPD Mother     . Bronchitis Mother   . Arthritis Mother   . Depression Mother   . Heart disease Mother   . Hypertension Mother   . Cancer - Colon Maternal Grandmother   . Alcohol abuse Father   . Early death Father        GSW  . PKU Son     Social History   Social History  . Marital status: Single    Spouse name: N/A  . Number of children: 1  . Years of education: HS   Occupational History  . disability     unemployed   Social History Main Topics  . Smoking status: Never Smoker  . Smokeless tobacco: Never Used  . Alcohol use Yes     Comment: drink occasionally  . Drug use: No  .  Sexual activity: Not Currently    Partners: Female    Birth control/ protection: None   Other Topics Concern  . None   Social History Narrative   Patient is left handed.   Patient drinks very little caffeine.   Lives alone       Review of Systems: General: Negative for anorexia, weight loss, fever, chills, fatigue, weakness. ENT: Negative for hoarseness, difficulty swallowing. CV: Negative for chest pain, angina, palpitations, peripheral edema.  Respiratory: Negative for dyspnea at rest, cough, sputum, wheezing.  GI: See history of present illness. MS: Admits chronic pain.  Derm: Negative for rash or itching.  Endo: Negative for unusual weight change.  Heme: Negative for bruising or bleeding. Allergy: Negative for rash or hives.   Physical Exam: BP (!) 132/95   Pulse (!) 106   Temp (!) 97.3 F (36.3 C) (Oral)   Ht 5' 4"  (1.626 m)   Wt 177 lb 9.6 oz (80.6 kg)   BMI 30.48 kg/m  General:   Obese female. Alert and oriented. Pleasant and cooperative. Well-nourished and well-developed.  Eyes:  Without icterus, sclera clear and conjunctiva pink.  Ears:  Normal auditory acuity. Cardiovascular:  S1, S2 present without murmurs appreciated. Extremities without clubbing or edema. Respiratory:  Clear to auscultation bilaterally. No wheezes, rales, or rhonchi. No distress.  Gastrointestinal:  +BS,  soft, non-tender and non-distended. No HSM noted. No guarding or rebound. No masses appreciated.  Rectal:  Deferred  Musculoskalatal:  Symmetrical without gross deformities. Neurologic:  Alert and oriented x4;  grossly normal neurologically. Psych:  Alert and cooperative. Normal mood and affect. Heme/Lymph/Immune: No excessive bruising noted.    12/02/2016 11:08 AM   Disclaimer: This note was dictated with voice recognition software. Similar sounding words can inadvertently be transcribed and may not be corrected upon review.

## 2016-12-02 NOTE — Progress Notes (Signed)
CC'D TO PCP °

## 2016-12-02 NOTE — Assessment & Plan Note (Signed)
Upper endoscopy with gastric erosions but no ulcerations. Surgical pathology was positive for H. pylori. She was treated with Pylera and omeprazole. She continues on omeprazole at this time. At this point I will check for eradication as recommended by guidelines. I will have her stop her PPI for 2 weeks. Have advised to take no H2 receptor blockers either. She can use Tums over-the-counter. If her symptoms are to severe off her PPI she is to call us so we can restart her PPI. Return for follow-up in 6 months. Continue prescription medications otherwise.

## 2016-12-02 NOTE — Assessment & Plan Note (Signed)
Constipation significantly improved with Linzess 145 g. She is having 3-4 bowel movements a day which are soft and occasionally running, she is satisfied with her bowel movements at this point. Recommend she continue her prescription Linzess. Call us if any worsening symptoms. Otherwise, return for follow-up in 6 months.

## 2016-12-06 DIAGNOSIS — M1711 Unilateral primary osteoarthritis, right knee: Secondary | ICD-10-CM | POA: Diagnosis not present

## 2016-12-09 ENCOUNTER — Telehealth: Payer: Self-pay

## 2016-12-09 NOTE — Telephone Encounter (Signed)
Pt is calling because she is not able to stop her heart burn medication for the breath test. She is vomiting due to not taking her medication. She is wanting to cancel the test because she is not able to hold her heart burn medication.

## 2016-12-09 NOTE — Telephone Encounter (Signed)
No problem. Start the medications for GERD and don't worry about the breath test.

## 2016-12-09 NOTE — Telephone Encounter (Signed)
Noted and pt is aware.

## 2016-12-10 NOTE — Telephone Encounter (Signed)
Pt is wanting know if she was to do a bland diet for 14 days would you still want her to do the breath test. Please advise

## 2016-12-10 NOTE — Telephone Encounter (Signed)
This encounter was created in error - please disregard.
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
## 2016-12-12 NOTE — Telephone Encounter (Signed)
LMOM to call back

## 2016-12-12 NOTE — Telephone Encounter (Signed)
The breath test is not reliable if she is on her PPI. Because she is too symptomatic off her PPI we'll cancel the breath test for now. Continue a regular diet with avoidance of any trigger foods/drinks that make her symptoms worse.

## 2016-12-13 NOTE — Telephone Encounter (Signed)
Left detailed message on machine.

## 2016-12-16 ENCOUNTER — Ambulatory Visit (HOSPITAL_COMMUNITY): Payer: Self-pay | Admitting: Psychiatry

## 2016-12-17 ENCOUNTER — Encounter (HOSPITAL_COMMUNITY): Payer: Self-pay | Admitting: Psychiatry

## 2016-12-17 ENCOUNTER — Ambulatory Visit (INDEPENDENT_AMBULATORY_CARE_PROVIDER_SITE_OTHER): Payer: Medicare Other | Admitting: Psychiatry

## 2016-12-17 VITALS — BP 136/104 | HR 96 | Ht 64.0 in | Wt 178.0 lb

## 2016-12-17 DIAGNOSIS — Z818 Family history of other mental and behavioral disorders: Secondary | ICD-10-CM | POA: Diagnosis not present

## 2016-12-17 DIAGNOSIS — G40909 Epilepsy, unspecified, not intractable, without status epilepticus: Secondary | ICD-10-CM

## 2016-12-17 DIAGNOSIS — Z79899 Other long term (current) drug therapy: Secondary | ICD-10-CM

## 2016-12-17 DIAGNOSIS — F5105 Insomnia due to other mental disorder: Secondary | ICD-10-CM

## 2016-12-17 DIAGNOSIS — Z62810 Personal history of physical and sexual abuse in childhood: Secondary | ICD-10-CM

## 2016-12-17 DIAGNOSIS — F411 Generalized anxiety disorder: Secondary | ICD-10-CM | POA: Diagnosis not present

## 2016-12-17 DIAGNOSIS — M797 Fibromyalgia: Secondary | ICD-10-CM | POA: Diagnosis not present

## 2016-12-17 DIAGNOSIS — F410 Panic disorder [episodic paroxysmal anxiety] without agoraphobia: Secondary | ICD-10-CM | POA: Diagnosis not present

## 2016-12-17 DIAGNOSIS — F99 Mental disorder, not otherwise specified: Secondary | ICD-10-CM

## 2016-12-17 MED ORDER — TRAZODONE HCL 50 MG PO TABS: 50.0000 mg | ORAL_TABLET | Freq: Every evening | ORAL | 1 refills | Status: DC | PRN

## 2016-12-17 MED ORDER — GABAPENTIN 300 MG PO CAPS: 300.0000 mg | ORAL_CAPSULE | Freq: Three times a day (TID) | ORAL | 1 refills | Status: DC

## 2016-12-17 MED ORDER — MIRTAZAPINE 15 MG PO TABS: 15.0000 mg | ORAL_TABLET | Freq: Every day | ORAL | 1 refills | Status: DC

## 2016-12-17 MED ORDER — ESCITALOPRAM OXALATE 20 MG PO TABS: 20.0000 mg | ORAL_TABLET | Freq: Every day | ORAL | 1 refills | Status: DC

## 2016-12-17 NOTE — Patient Instructions (Signed)
1. Continue mirtazapine 15 mg at night 2. Continue lexapro 20 mg daily 3. Increase gabapentin 300 mg three times a day 4. Continue Trazodone 50 mg at night as needed for sleep 5. Return to clinic in two months for 30 mins

## 2016-12-18 DIAGNOSIS — G5601 Carpal tunnel syndrome, right upper limb: Secondary | ICD-10-CM | POA: Diagnosis not present

## 2016-12-18 DIAGNOSIS — G894 Chronic pain syndrome: Secondary | ICD-10-CM | POA: Diagnosis not present

## 2016-12-18 DIAGNOSIS — M47817 Spondylosis without myelopathy or radiculopathy, lumbosacral region: Secondary | ICD-10-CM | POA: Diagnosis not present

## 2016-12-18 DIAGNOSIS — G5602 Carpal tunnel syndrome, left upper limb: Secondary | ICD-10-CM | POA: Diagnosis not present

## 2016-12-26 ENCOUNTER — Ambulatory Visit (INDEPENDENT_AMBULATORY_CARE_PROVIDER_SITE_OTHER): Payer: Medicare Other | Admitting: Family Medicine

## 2016-12-26 ENCOUNTER — Encounter: Payer: Self-pay | Admitting: Family Medicine

## 2016-12-26 VITALS — BP 118/88 | HR 92 | Temp 97.4°F | Resp 16 | Ht 64.0 in | Wt 180.1 lb

## 2016-12-26 DIAGNOSIS — J029 Acute pharyngitis, unspecified: Secondary | ICD-10-CM

## 2016-12-26 LAB — POCT RAPID STREP A (OFFICE): Rapid Strep A Screen: NEGATIVE

## 2016-12-26 NOTE — Progress Notes (Signed)
Patient ID: Claire [name], female    DOB: [birth year], 46 y.o.   MRN: [id number]  Chief Complaint  Patient presents with  . Sore Throat    x 4 days    Allergies Baclofen; Claritin [loratadine]; Cymbalta [duloxetine hcl]; Penicillins; Sulfa antibiotics; and Ultram [tramadol]  Subjective:   Claire [name] is a 46 y.o. female who presents to [facility] today.  HPI Sore throat x 4 days. Feels like it is getting worse. Feels tired. No fevers. Always has chills on and off per her reports due to FM and perimonopausal. No cough. No SOB. Breathing ok. Itchy, burning, throat. Has not taken anything for it except cough drops which help. Nothing makes it worse. Reports that throat feels dry. Swallow ok but hurts. No edema in mouth.    Sore Throat   This is a new problem. The current episode started in the past 7 days. The problem has been gradually worsening. There has been no fever. The pain is at a severity of 4/10. The pain is mild. Associated symptoms include congestion, ear pain, a hoarse voice and a plugged ear sensation. Pertinent negatives include no coughing, diarrhea, drooling, ear discharge, neck pain, shortness of breath, stridor, swollen glands, trouble swallowing or vomiting. She has had exposure to strep. Treatments tried: cough drops. The treatment provided mild relief.    Past Medical History:  Diagnosis Date  . Acid reflux   . Allergy    pollen, dust  . Anxiety   . Arthritis   . Asthma   . Carpal tunnel syndrome    bilateral  . DDD (degenerative disc disease), cervical   . Depression   . Fibromyalgia   . Hypertension   . Migraine   . Palpitations   . Panic attacks   . Seizures ([place])    unknown etiology; last seizure was 2 years ago; on meds.  . Syncope and collapse   . Tennis elbow   . Ulcer   . Vertigo     Past Surgical History:  Procedure Laterality Date  . BACK SURGERY  1993  . BIOPSY  10/21/2016   Procedure: BIOPSY;  Surgeon: Daneil Dolin,  MD;  [facility];  Service: Endoscopy;;  gastric, esophagus  . CHOLECYSTECTOMY    . ESOPHAGOGASTRODUODENOSCOPY (EGD) WITH PROPOFOL N/A 10/21/2016   Procedure: ESOPHAGOGASTRODUODENOSCOPY (EGD) WITH PROPOFOL;  Surgeon: Daneil Dolin, MD;  [facility];  Service: Endoscopy;  Laterality: N/A;  9:30am  . GALLBLADDER SURGERY    . SHOULDER SURGERY Right   . SPINE SURGERY  1993   lumbar disc surgery    Family History  Problem Relation Age of Onset  . COPD Mother   . Bronchitis Mother   . Arthritis Mother   . Depression Mother   . Heart disease Mother   . Hypertension Mother   . Cancer - Colon Maternal Grandmother   . Alcohol abuse Father   . Early death Father        GSW  . PKU Son      Social History   Social History  . Marital status: Single    Spouse name: N/A  . Number of children: 1  . Years of education: HS   Occupational History  . disability     unemployed   Social History Main Topics  . Smoking status: Never Smoker  . Smokeless tobacco: Never Used  . Alcohol use Yes     Comment: drink occasionally  .  Drug use: No  . Sexual activity: Not Currently    Partners: Female    Birth control/ protection: None   Other Topics Concern  . None   Social History Narrative   Patient is left handed.   Patient drinks very little caffeine.   Lives alone       Review of Systems  HENT: Positive for congestion, ear pain and hoarse voice. Negative for drooling, ear discharge and trouble swallowing.   Respiratory: Negative for cough, shortness of breath and stridor.   Gastrointestinal: Negative for diarrhea and vomiting.  Musculoskeletal: Negative for neck pain.     Objective:   BP 118/88 (BP Location: Right Arm, Patient Position: Sitting, Cuff Size: Normal)   Pulse 92   Temp (!) 97.4 F (36.3 C) (Temporal)   Resp 16   Ht 5\' 4"  (1.626 m)   Wt 180 lb 1.9 oz (81.7 kg)   SpO2 97%   BMI 30.92 kg/m   Physical Exam  Constitutional: She is  oriented to person, place, and time. She appears well-developed and well-nourished. No distress.  HENT:  Head: Normocephalic and atraumatic.  Right Ear: External ear normal.  Left Ear: External ear normal.  Nose: Nose normal.  Mouth/Throat: Uvula is midline, oropharynx is clear and moist and mucous membranes are normal. Mucous membranes are not pale. No oral lesions. No trismus in the jaw. Normal dentition. No uvula swelling or lacerations. No oropharyngeal exudate, posterior oropharyngeal edema, posterior oropharyngeal erythema or tonsillar abscesses. No tonsillar exudate.  Eyes: Pupils are equal, round, and reactive to light. Conjunctivae are normal. Right eye exhibits no discharge. Left eye exhibits no discharge.  Neck: Normal range of motion. Neck supple. No JVD present. No tracheal deviation present. No thyromegaly present.  Cardiovascular: Normal rate, regular rhythm and normal heart sounds.   Pulmonary/Chest: Effort normal and breath sounds normal. No stridor. No respiratory distress.  Lymphadenopathy:    She has no cervical adenopathy.  Neurological: She is alert and oriented to person, place, and time.  Skin: Skin is warm and dry.  Nursing note and vitals reviewed.     Assessment and Plan     1. Sore throat, secondary to viral pharyngitis Supportive care recommended.  Increase fluids. Cough drops/keep throat moist. Advil OTC as directed prn.  Discussed risks of cardiovascular thrombotic events related to NSAIDS. Discussed increased risk of AMI and CVA. Discussed risk of serious GI adverse events including bleeding, ulcers, and perforation. Patient understands risks of this medication.  Told to take Advil with food.  Counseled regarding wo - POCT rapid strep A  Told to call with questions concerns or worrisome symptoms. Patient told to call if symptoms did not resolve within 7-10 days.   Return if symptoms worsen or fail to improve. Caren Macadam, MD 12/27/2016

## 2017-01-01 DIAGNOSIS — M1712 Unilateral primary osteoarthritis, left knee: Secondary | ICD-10-CM | POA: Diagnosis not present

## 2017-01-20 DIAGNOSIS — G5601 Carpal tunnel syndrome, right upper limb: Secondary | ICD-10-CM | POA: Diagnosis not present

## 2017-01-20 DIAGNOSIS — G5602 Carpal tunnel syndrome, left upper limb: Secondary | ICD-10-CM | POA: Diagnosis not present

## 2017-01-20 DIAGNOSIS — G894 Chronic pain syndrome: Secondary | ICD-10-CM | POA: Diagnosis not present

## 2017-01-20 DIAGNOSIS — M47817 Spondylosis without myelopathy or radiculopathy, lumbosacral region: Secondary | ICD-10-CM | POA: Diagnosis not present

## 2017-01-27 ENCOUNTER — Telehealth: Payer: Self-pay | Admitting: Neurology

## 2017-01-27 NOTE — Telephone Encounter (Signed)
Called [place] and let a message for Mary Sella Telephone [phone] about KEPPRA to please call me back.

## 2017-01-30 NOTE — Telephone Encounter (Signed)
Mailed to Mary Sella at [employer] Patient assistance Forms  2019.

## 2017-01-30 NOTE — Telephone Encounter (Signed)
2019 [employer] Return to Me I will take care of paper work.

## 2017-01-30 NOTE — Telephone Encounter (Signed)
Gave completed/signed form back to [place] [facility] to process for patient.
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
## 2017-02-17 ENCOUNTER — Ambulatory Visit (INDEPENDENT_AMBULATORY_CARE_PROVIDER_SITE_OTHER): Payer: Medicare Other | Admitting: Psychiatry

## 2017-02-17 ENCOUNTER — Encounter (HOSPITAL_COMMUNITY): Payer: Self-pay | Admitting: Psychiatry

## 2017-02-17 VITALS — BP 124/84 | HR 84 | Ht 64.0 in | Wt 179.0 lb

## 2017-02-17 DIAGNOSIS — G4700 Insomnia, unspecified: Secondary | ICD-10-CM

## 2017-02-17 DIAGNOSIS — Z736 Limitation of activities due to disability: Secondary | ICD-10-CM

## 2017-02-17 DIAGNOSIS — R450 Nervousness: Secondary | ICD-10-CM | POA: Diagnosis not present

## 2017-02-17 DIAGNOSIS — F411 Generalized anxiety disorder: Secondary | ICD-10-CM

## 2017-02-17 DIAGNOSIS — Z818 Family history of other mental and behavioral disorders: Secondary | ICD-10-CM

## 2017-02-17 DIAGNOSIS — Z811 Family history of alcohol abuse and dependence: Secondary | ICD-10-CM

## 2017-02-17 DIAGNOSIS — F410 Panic disorder [episodic paroxysmal anxiety] without agoraphobia: Secondary | ICD-10-CM | POA: Diagnosis not present

## 2017-02-17 DIAGNOSIS — M797 Fibromyalgia: Secondary | ICD-10-CM

## 2017-02-17 MED ORDER — TRAZODONE HCL 50 MG PO TABS: 50.0000 mg | ORAL_TABLET | Freq: Every evening | ORAL | 1 refills | Status: DC | PRN

## 2017-02-17 MED ORDER — MIRTAZAPINE 30 MG PO TABS: 30.0000 mg | ORAL_TABLET | Freq: Every day | ORAL | 1 refills | Status: DC

## 2017-02-17 MED ORDER — ESCITALOPRAM OXALATE 20 MG PO TABS: 20.0000 mg | ORAL_TABLET | Freq: Every day | ORAL | 1 refills | Status: DC

## 2017-02-17 MED ORDER — GABAPENTIN 300 MG PO CAPS: 300.0000 mg | ORAL_CAPSULE | Freq: Three times a day (TID) | ORAL | 1 refills | Status: DC

## 2017-02-17 NOTE — Patient Instructions (Signed)
1. Increase mirtazapine 30 mg at night 2. Continue lexapro 20 mg daily 3. Continue gabapentin 300 mg three times a day 4. Continue Trazodone 50 mg at night as needed for sleep 5. Return to clinic in two months for 15 mins 6. Referral to therapy

## 2017-02-18 DIAGNOSIS — G894 Chronic pain syndrome: Secondary | ICD-10-CM | POA: Diagnosis not present

## 2017-02-18 DIAGNOSIS — G5601 Carpal tunnel syndrome, right upper limb: Secondary | ICD-10-CM | POA: Diagnosis not present

## 2017-02-18 DIAGNOSIS — G5602 Carpal tunnel syndrome, left upper limb: Secondary | ICD-10-CM | POA: Diagnosis not present

## 2017-02-18 DIAGNOSIS — M47817 Spondylosis without myelopathy or radiculopathy, lumbosacral region: Secondary | ICD-10-CM | POA: Diagnosis not present

## 2017-02-27 DIAGNOSIS — G5603 Carpal tunnel syndrome, bilateral upper limbs: Secondary | ICD-10-CM | POA: Diagnosis not present

## 2017-03-03 ENCOUNTER — Emergency Department (HOSPITAL_COMMUNITY)
Admission: EM | Admit: 2017-03-03 | Discharge: 2017-03-03 | Disposition: A | Discharge status: Home / Self Care | Payer: Medicare Other | Attending: Emergency Medicine | Admitting: Emergency Medicine

## 2017-03-03 ENCOUNTER — Other Ambulatory Visit: Payer: Self-pay

## 2017-03-03 ENCOUNTER — Encounter (HOSPITAL_COMMUNITY): Payer: Self-pay | Admitting: *Deleted

## 2017-03-03 ENCOUNTER — Ambulatory Visit (HOSPITAL_COMMUNITY): Payer: Medicare Other | Admitting: Licensed Clinical Social Worker

## 2017-03-03 DIAGNOSIS — B019 Varicella without complication: Secondary | ICD-10-CM | POA: Insufficient documentation

## 2017-03-03 DIAGNOSIS — I10 Essential (primary) hypertension: Secondary | ICD-10-CM | POA: Diagnosis not present

## 2017-03-03 DIAGNOSIS — R21 Rash and other nonspecific skin eruption: Secondary | ICD-10-CM | POA: Diagnosis present

## 2017-03-03 DIAGNOSIS — Z79899 Other long term (current) drug therapy: Secondary | ICD-10-CM | POA: Diagnosis not present

## 2017-03-03 MED ORDER — ONDANSETRON 4 MG PO TBDP: 4.0000 mg | ORAL_TABLET | Freq: Three times a day (TID) | ORAL | 0 refills | Status: DC | PRN

## 2017-03-03 MED ORDER — LIDOCAINE VISCOUS 2 % MT SOLN: 15.0000 mL | OROMUCOSAL | 0 refills | Status: DC | PRN

## 2017-03-03 NOTE — ED Provider Notes (Signed)
[facility] EMERGENCY DEPARTMENT Provider Note   CSN: [id number] Arrival date & time: 03/03/17  1224     History   Chief Complaint Chief Complaint  Patient presents with  . Rash    HPI Claire [name] is a 46 y.o. female with a past medical history of HTN, environmental allergies, presents to ED for evaluation of rash to torso, back, bilateral arms up to knees for the past 4 days. Reports that the rash is itchy and is spreading from area to area progressively. Few blisters noted in extremities. Also reports burning in her groin area after taking a shower today. No vaginal discharge or dysuria reported. Has tried Benadryl and steroid cream with no relief in symptoms. Has not used any new soaps, lotion or detergents recently. Saw a few ticks on her cat and is unsure if this is what caused her symptoms. No previous history of similar symptoms. Denies any angioedema or airway compromise.  Denies any neck pain, fevers, sick contacts with similar symptoms. She has not had chickenpox in the past, that she is aware of. She has not received the varicella vaccination.  HPI  Past Medical History:  Diagnosis Date  . Acid reflux   . Allergy    pollen, dust  . Anxiety   . Arthritis   . Asthma   . Carpal tunnel syndrome    bilateral  . DDD (degenerative disc disease), cervical   . Depression   . Fibromyalgia   . Hypertension   . Migraine   . Palpitations   . Panic attacks   . Seizures ([facility])    unknown etiology; last seizure was 2 years ago; on meds.  . Syncope and collapse   . Tennis elbow   . Ulcer   . Vertigo     Patient Active Problem List   Diagnosis Date Noted  . Eosinophilic esophagitis 76/73/4193  . H. pylori infection 12/02/2016  . Dysphagia 07/03/2016  . Constipation 07/03/2016  . Abdominal pain 07/03/2016  . Generalized anxiety disorder 06/17/2016  . Panic disorder 06/17/2016  . Asthma in adult, mild intermittent, uncomplicated 79/04/4095  . GERD without esophagitis  06/11/2016  . 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 Frequent falls 06/11/2016  . Vertigo 06/11/2016  . History of syncope 06/11/2016  . Fibromyalgia 07/28/2014    Past Surgical History:  Procedure Laterality Date  . BACK SURGERY  1993  . BIOPSY  10/21/2016   Procedure: BIOPSY;  Surgeon: Daneil Dolin, MD;  Location: [facility];  Service: Endoscopy;;  gastric, esophagus  . CHOLECYSTECTOMY    . ESOPHAGOGASTRODUODENOSCOPY (EGD) WITH PROPOFOL N/A 10/21/2016   Procedure: ESOPHAGOGASTRODUODENOSCOPY (EGD) WITH PROPOFOL;  Surgeon: Daneil Dolin, MD;  Location: [facility];  Service: Endoscopy;  Laterality: N/A;  9:30am  . GALLBLADDER SURGERY    . SHOULDER SURGERY Right   . SPINE SURGERY  1993   lumbar disc surgery    OB History    No data available       Home Medications    Prior to Admission medications   Medication Sig Start Date End Date Taking? Authorizing Provider  albuterol (PROVENTIL HFA;VENTOLIN HFA) 108 (90 Base) MCG/ACT inhaler Inhale 1-2 puffs into the lungs every 6 (six) hours as needed for wheezing or shortness of breath.     [provider]  carboxymethylcellulose (REFRESH PLUS) 0.5 % SOLN Place 1 drop into both eyes 2 (two) times daily.    [provider]  diphenhydrAMINE (BENADRYL) 25 MG tablet Take 50  mg by mouth 2 (two) times daily.    [provider]  escitalopram (LEXAPRO) 20 MG tablet Take 1 tablet (20 mg total) by mouth daily. 02/17/17   Norman Clay, MD  gabapentin (NEURONTIN) 300 MG capsule Take 1 capsule (300 mg total) by mouth 3 (three) times daily. 02/17/17   Norman Clay, MD  HYDROcodone-acetaminophen (NORCO/VICODIN) 5-325 MG tablet Take 1 tablet by mouth as needed for moderate pain.    [provider]  levETIRAcetam (KEPPRA) 500 MG tablet Take 1 tablet (500 mg total) by mouth 2 (two) times daily. 10/10/16   Kathrynn Ducking, MD  lidocaine (XYLOCAINE) 2 % solution Use as directed 15 mLs in the mouth or throat as needed for  mouth pain. 03/03/17   Delia Heady, PA-C  linaclotide (LINZESS) 72 MCG capsule Take 1 capsule (72 mcg total) by mouth daily before breakfast. 07/18/16   Carlis Stable, NP  metoprolol succinate (TOPROL-XL) 25 MG 24 hr tablet Take 1 tablet (25 mg total) by mouth daily. 09/13/16   Raylene Everts, MD  Milnacipran (SAVELLA) 50 MG TABS tablet Take 50 mg by mouth 2 (two) times daily.    [provider]  mirtazapine (REMERON) 30 MG tablet Take 1 tablet (30 mg total) by mouth at bedtime. 02/17/17   Norman Clay, MD  Multiple Vitamin (MULTIVITAMIN WITH MINERALS) TABS tablet Take 2 tablets by mouth daily.     [provider]  omeprazole (PRILOSEC) 20 MG capsule Take 1 capsule (20 mg total) by mouth 2 (two) times daily. 10/29/16   Rourk, Cristopher Estimable, MD  omeprazole (PRILOSEC) 40 MG capsule Take 1 capsule (40 mg total) by mouth daily. 10/07/16   Raylene Everts, MD  ondansetron (ZOFRAN ODT) 4 MG disintegrating tablet Take 1 tablet (4 mg total) by mouth every 8 (eight) hours as needed for nausea or vomiting. 03/03/17   Delia Heady, PA-C  Potassium 99 MG TABS Take 198 mg by mouth daily.     [provider]  Probiotic CAPS Take 2 capsules by mouth daily.     [provider]  tiZANidine (ZANAFLEX) 2 MG tablet Take 2 mg by mouth 3 (three) times daily. 09/30/16   [provider]  traZODone (DESYREL) 50 MG tablet Take 1 tablet (50 mg total) by mouth at bedtime as needed for sleep. 02/17/17   Norman Clay, MD    Family History Family History  Problem Relation Age of Onset  . COPD Mother   . Bronchitis Mother   . Arthritis Mother   . Depression Mother   . Heart disease Mother   . Hypertension Mother   . Cancer - Colon Maternal Grandmother   . Alcohol abuse Father   . Early death Father        GSW  . PKU Son     Social History Social History   Tobacco Use  . Smoking status: Never Smoker  . Smokeless tobacco: Never Used  Substance Use Topics  . Alcohol use:  Yes    Comment: drink occasionally  . Drug use: No     Allergies   Baclofen; Claritin [loratadine]; Cymbalta [duloxetine hcl]; Penicillins; Sulfa antibiotics; and Ultram [tramadol]   Review of Systems Review of Systems  Constitutional: Negative for appetite change, chills and fever.  HENT: Positive for congestion and rhinorrhea. Negative for ear pain, sneezing and sore throat.   Eyes: Negative for photophobia and visual disturbance.  Respiratory: Negative for cough, chest tightness, shortness of breath and wheezing.  Cardiovascular: Negative for chest pain and palpitations.  Gastrointestinal: Negative for abdominal pain, blood in stool, constipation, diarrhea, nausea and vomiting.  Genitourinary: Negative for dysuria, hematuria and urgency.  Musculoskeletal: Negative for myalgias.  Skin: Positive for rash.  Neurological: Negative for dizziness, weakness and light-headedness.     Physical Exam Updated Vital Signs BP (!) 159/117 (BP Location: Right Arm)   Pulse (!) 102   Temp 98.6 F (37 C) (Oral)   Resp 19   Ht 5\' 4"  (1.626 m)   Wt 77.1 kg (170 lb)   LMP  (Within Months)   SpO2 98%   BMI 29.18 kg/m   Physical Exam  Constitutional: She appears well-developed and well-nourished. No distress.  HENT:  Head: Normocephalic and atraumatic.  Nose: Nose normal.  Mouth/Throat: Posterior oropharyngeal erythema present. No posterior oropharyngeal edema. No tonsillar exudate.  Patient does not appear to be in acute distress. No trismus or drooling present. No pooling of secretions. Patient is tolerating secretions and is not in respiratory distress. No neck pain or tenderness to palpation of the neck. Full active and passive range of motion of the neck. No evidence of RPA or PTA.  Eyes: Conjunctivae and EOM are normal. Left eye exhibits no discharge. No scleral icterus.  Neck: Normal range of motion. Neck supple.  Cardiovascular: Normal rate, regular rhythm, normal heart sounds and  intact distal pulses. Exam reveals no gallop and no friction rub.  No murmur heard. Pulmonary/Chest: Effort normal and breath sounds normal. No respiratory distress.  Abdominal: Soft. Bowel sounds are normal. She exhibits no distension. There is no tenderness. There is no guarding.  Musculoskeletal: Normal range of motion. She exhibits no edema.  Neurological: She is alert. She exhibits normal muscle tone. Coordination normal.  Skin: Skin is warm and dry. Rash noted.  Several erythematous papules and blisters noted in various stages upper extremities, thighs and abdomen.  No linear streaking or excoriation noted.  Psychiatric: She has a normal mood and affect.  Nursing note and vitals reviewed.    ED Treatments / Results  Labs (all labs ordered are listed, but only abnormal results are displayed) Labs Reviewed - No data to display  EKG  EKG Interpretation None       Radiology No results found.  Procedures Procedures (including critical care time)  Medications Ordered in ED Medications - No data to display   Initial Impression / Assessment and Plan / ED Course  I have reviewed the triage vital signs and the nursing notes.  Pertinent labs & imaging results that were available during my care of the patient were reviewed by me and considered in my medical decision making (see chart for details).     Patient presents to ED for evaluation of rash to bilateral upper extremities, thighs and abdomen for the past 4 days.  States that the rash is itchy and spreading from area to area progressively.  No new environmental exposures.  She was not vaccinated for chickenpox and was out exposed to it as a child.  No sick contacts with similar symptoms.  On physical exam there is several erythematous lesions in torso, lower extremities and upper extremities in various stages of healing that appear consistent with chickenpox.   Pt has a patent airway without stridor and is handling secretions  without difficulty; no angioedema. No pustules, no warmth, no draining sinus tracts, no superficial abscesses, no bullous impetigo, no vesicles, no desquamation, no target lesions with dusky purpura or a central bulla. Not  tender to touch. No concern for superimposed infection. No concern for SJS, TEN, TSS, tick borne illness, syphilis or other life-threatening condition.  Unsure of where she contracted this.  Will give symptomatic treatment to help with sore throat as well as Zofran to help with nausea.  Advised on precautions to stop spread of this infection.  Patient appears stable for discharge at this time.  Strict return precautions given. Patient discussed with and seen by Dr. Rogene Houston.  Portions of this note were generated with [employer]. Dictation errors may occur despite best attempts at proofreading.   Final Clinical Impressions(s) / ED Diagnoses   Final diagnoses:  Rash and nonspecific skin eruption  Varicella without complication    ED Discharge Orders        Ordered    lidocaine (XYLOCAINE) 2 % solution  As needed     03/03/17 1409    ondansetron (ZOFRAN ODT) 4 MG disintegrating tablet  Every 8 hours PRN     03/03/17 1409       Delia Heady, PA-C 03/03/17 1414    Fredia Sorrow, MD 03/03/17 1547

## 2017-03-03 NOTE — ED Notes (Signed)
EDP at bedside  

## 2017-03-03 NOTE — ED Triage Notes (Addendum)
Pt c/o itchy, red, raised rash from neck down to knees x 4 days. PT reports the rash is going into her groin area and burning. Pt has used benadryl and OTC creams with no relief. Pt reports she thought it was flea bites because her cat slept in her bed a few days ago and the cat had fleas.   Pt's BP in triage 159/117. Pt reports she has taken her BP meds today. Denies dizziness, blurry vision.

## 2017-03-03 NOTE — ED Provider Notes (Signed)
Medical screening examination/treatment/procedure(s) were conducted as a shared visit with non-physician practitioner(s) and myself.  I personally evaluated the patient during the encounter.   EKG Interpretation None       Patient seen by me along with the physician assistant.  Patient with a rash that started on her right pelvic area.  On 12 December.  Over the last few days it has spread to other parts of her body.  It is itchy.  There are some vesicles.  On erythematous bases.  Patient with some mild systemic symptoms to include headache little bit of body aches little bit of sore throat symptoms and rash highly suspicious for chickenpox.  Patient will be treated symptomatically and be given precautions.  She will return for any new or worse symptoms.   Fredia Sorrow, MD 03/03/17 [phone]

## 2017-03-03 NOTE — Discharge Instructions (Signed)
Please read attached information regarding your condition. Swish and spit lidocaine solution as needed for throat discomfort. Take Zofran as needed for nausea. Please refrain from physical contact for the next several days until symptoms resolve as your rash is thought to be very contagious. Follow-up with your primary care provider for further evaluation. Return to ED for worsening rash, lip swelling, trouble breathing, fevers, trouble swallowing.

## 2017-03-17 ENCOUNTER — Encounter: Payer: Self-pay | Admitting: Family Medicine

## 2017-03-17 ENCOUNTER — Other Ambulatory Visit: Payer: Self-pay

## 2017-03-17 ENCOUNTER — Ambulatory Visit (INDEPENDENT_AMBULATORY_CARE_PROVIDER_SITE_OTHER): Payer: Medicare Other | Admitting: Family Medicine

## 2017-03-17 VITALS — BP 134/96 | HR 88 | Temp 97.8°F | Resp 18 | Ht 64.0 in | Wt 181.0 lb

## 2017-03-17 DIAGNOSIS — L309 Dermatitis, unspecified: Secondary | ICD-10-CM

## 2017-03-17 DIAGNOSIS — L299 Pruritus, unspecified: Secondary | ICD-10-CM

## 2017-03-17 MED ORDER — HYDROXYZINE HCL 25 MG PO TABS: 25.0000 mg | ORAL_TABLET | Freq: Three times a day (TID) | ORAL | 0 refills | Status: DC | PRN

## 2017-03-17 MED ORDER — HYDROCORTISONE 2.5 % EX CREA: TOPICAL_CREAM | Freq: Two times a day (BID) | CUTANEOUS | 0 refills | Status: DC

## 2017-03-17 NOTE — Progress Notes (Signed)
Chief Complaint  Patient presents with  . Varicella   Patient has a very itchy rash around her middle, that has been present since 12/12.  She went to the emergency room on 03/03/17.  She was diagnosed as having chickenpox.  She was given infection control/prevention instructions.  She had medicine for nausea.  She was told to take Benadryl for the itching.  She was told it was self-limited and should go away in a matter of days. both her brother and sister had chickenpox.  She does not recall having chickenpox.  Her mother does not recall that she had chickenpox either. She has not had any fever.  Initially had some myalgias and sore throat.  These feel like they have gotten better.  The rash, however, continues to break out and spread.  She states initially she had blisters that started to crust over, but then as soon as they go away a new set of blisters comes up.  At any time she has old lesions, new lesions, and terrible itching.  The rash has been almost entirely around her middle waistline and down in her groin area.  Very few up in the upper chest back arms or legs.  There was one patch on her thigh at one point that went away.  No coughing or congestion. She called in today and asked to be seen because of a new crop of very itchy spots with blisters on her abdomen. No new medicines.  No new nutritional supplements or foods.  No new soap or laundry products.  No new garments.  No known allergies causing similar dermatitis.  Can Not think of any exposure to children or adults who are ill.  Patient Active Problem List   Diagnosis Date Noted  . Eosinophilic esophagitis 83/38/2505  . H. pylori infection 12/02/2016  . Dysphagia 07/03/2016  . Constipation 07/03/2016  . Abdominal pain 07/03/2016  . Generalized anxiety disorder 06/17/2016  . Panic disorder 06/17/2016  . Asthma in adult, mild intermittent, uncomplicated 39/76/7341  . GERD without esophagitis 06/11/2016  . TMJ arthropathy  06/11/2016  . Frequent falls 06/11/2016  . Vertigo 06/11/2016  . History of syncope 06/11/2016  . Fibromyalgia 07/28/2014    Outpatient Encounter Medications as of 03/17/2017  Medication Sig  . albuterol (PROVENTIL HFA;VENTOLIN HFA) 108 (90 Base) MCG/ACT inhaler Inhale 1-2 puffs into the lungs every 6 (six) hours as needed for wheezing or shortness of breath.   . carboxymethylcellulose (REFRESH PLUS) 0.5 % SOLN Place 1 drop into both eyes 2 (two) times daily.  . diphenhydrAMINE (BENADRYL) 25 MG tablet Take 50 mg by mouth 2 (two) times daily.  Marland Kitchen escitalopram (LEXAPRO) 20 MG tablet Take 1 tablet (20 mg total) by mouth daily.  Marland Kitchen gabapentin (NEURONTIN) 300 MG capsule Take 1 capsule (300 mg total) by mouth 3 (three) times daily.  Marland Kitchen HYDROcodone-acetaminophen (NORCO/VICODIN) 5-325 MG tablet Take 1 tablet by mouth as needed for moderate pain.  Marland Kitchen levETIRAcetam (KEPPRA) 500 MG tablet Take 1 tablet (500 mg total) by mouth 2 (two) times daily.  Marland Kitchen lidocaine (XYLOCAINE) 2 % solution Use as directed 15 mLs in the mouth or throat as needed for mouth pain.  Marland Kitchen linaclotide (LINZESS) 72 MCG capsule Take 1 capsule (72 mcg total) by mouth daily before breakfast.  . metoprolol succinate (TOPROL-XL) 25 MG 24 hr tablet Take 1 tablet (25 mg total) by mouth daily.  . Milnacipran (SAVELLA) 50 MG TABS tablet Take 50 mg by mouth 2 (two) times  daily.  . mirtazapine (REMERON) 30 MG tablet Take 1 tablet (30 mg total) by mouth at bedtime.  . Multiple Vitamin (MULTIVITAMIN WITH MINERALS) TABS tablet Take 2 tablets by mouth daily.   Marland Kitchen omeprazole (PRILOSEC) 20 MG capsule Take 1 capsule (20 mg total) by mouth 2 (two) times daily.  Marland Kitchen omeprazole (PRILOSEC) 40 MG capsule Take 1 capsule (40 mg total) by mouth daily.  . ondansetron (ZOFRAN ODT) 4 MG disintegrating tablet Take 1 tablet (4 mg total) by mouth every 8 (eight) hours as needed for nausea or vomiting.  . Potassium 99 MG TABS Take 198 mg by mouth daily.   . Probiotic CAPS  Take 2 capsules by mouth daily.   Marland Kitchen tiZANidine (ZANAFLEX) 2 MG tablet Take 2 mg by mouth 3 (three) times daily.  . traZODone (DESYREL) 50 MG tablet Take 1 tablet (50 mg total) by mouth at bedtime as needed for sleep.  . hydrocortisone 2.5 % cream Apply topically 2 (two) times daily.  . hydrOXYzine (ATARAX/VISTARIL) 25 MG tablet Take 1 tablet (25 mg total) by mouth 3 (three) times daily as needed.   No facility-administered encounter medications on file as of 03/17/2017.     Allergies  Allergen Reactions  . Baclofen Shortness Of Breath    Swollen ankles  . Claritin [Loratadine] Other (See Comments)    shaking  . Cymbalta [Duloxetine Hcl] Hives    "Hives, forgot who I was"  . Penicillins Hives    Has patient had a PCN reaction causing immediate rash, facial/tongue/throat swelling, SOB or lightheadedness with hypotension: Unknown Has patient had a PCN reaction causing severe rash involving mucus membranes or skin necrosis: Yes Has patient had a PCN reaction that required hospitalization: Unknown Has patient had a PCN reaction occurring within the last 10 years: Unknown If all of the above answers are "NO", then may proceed with Cephalosporin use.  . Sulfa Antibiotics Hives  . Ultram [Tramadol] Other (See Comments)    Dizzy    Review of Systems  Constitutional: Negative for activity change, appetite change, chills, fever and unexpected weight change.  HENT: Positive for congestion and sore throat. Negative for dental problem.        Sore throat initially that this is improving  Eyes: Negative for redness and visual disturbance.  Respiratory: Negative for cough and shortness of breath.   Cardiovascular: Negative for chest pain and leg swelling.  Gastrointestinal: Positive for nausea. Negative for abdominal pain and vomiting.  Genitourinary: Negative for difficulty urinating and dysuria.  Musculoskeletal: Positive for back pain and myalgias.       Chronic  Skin: Positive for rash.    Neurological: Negative for facial asymmetry and light-headedness.  Psychiatric/Behavioral: Positive for sleep disturbance. Negative for dysphoric mood. The patient is not nervous/anxious.        From itching, in spite of Benadryl    BP (!) 134/96 (BP Location: Right Arm, Patient Position: Sitting, Cuff Size: Normal)   Pulse 88   Temp 97.8 F (36.6 C) (Temporal)   Resp 18   Ht 5\' 4"  (1.626 m)   Wt 181 lb 0.6 oz (82.1 kg)   SpO2 100%   BMI 31.08 kg/m   Physical Exam  Constitutional: She appears well-developed and well-nourished. No distress.  Uncomfortable, constant rubbing and scratching of skin  HENT:  Head: Normocephalic and atraumatic.  Right Ear: External ear normal.  Left Ear: External ear normal.  Nose: Nose normal.  Mouth/Throat: Oropharynx is clear and moist.  Minimal posterior  pharyngeal erythema with no vesicles or lesion  Eyes: Conjunctivae are normal. Pupils are equal, round, and reactive to light.  Eyes are clear  Neck: Normal range of motion. Neck supple.  Cardiovascular: Normal rate, regular rhythm and normal heart sounds.  Pulmonary/Chest: Effort normal and breath sounds normal. No respiratory distress.  Abdominal: Soft. Bowel sounds are normal. There is no tenderness.  Lymphadenopathy:    She has no cervical adenopathy.  Skin: Skin is warm and dry. Capillary refill takes 2 to 3 seconds. Rash noted. No lesion noted. Rash is maculopapular and vesicular. No cyanosis. Nails show no clubbing.     The areas indicated on anatomic chart scattered new and healing lesions.  Old lesions are small eschars 2-3 mm across.  New lesions are papular erythematous raised areas about 2 cm across with a pinpoint vesicle seen in most of the.  They do not appear to be classic chickenpox lesions (dewdrop).  Her upper chest and back, arms and legs are spared of new lesions  Psychiatric: She has a normal mood and affect. Her behavior is normal.    ASSESSMENT/PLAN:  1. Dermatitis I  was uncertain of her diagnosis, except that I know if this is not chickenpox.  I called dermatology and was able to reach Lavonna Monarch, MD 1 of our [facility] dermatologist.  He was not on call but kindly discussed the differential diagnosis with me.  He states it is unlikely chickenpox.  He said external irritation like contact dermatitis or scabies or possible but this does not sound or look like this.  He states that he she could have an autoimmune disorder that sometimes affects the pelvic girdle.  We discussed appropriate treatment.  Cortisone cream, anti-itch medication, and referral to dermatologist in 2 weeks.  I discussed this with the patient and have her call me back after a few days of therapy.  2. Severe itching Hydroxyzine   Patient Instructions  Apply cream twice a day Take the hydroxyzine as needed itching May take 2 at night Call if not better in a few days We will call with Derm appointment   Raylene Everts, MD

## 2017-03-17 NOTE — Patient Instructions (Signed)
Apply cream twice a day Take the hydroxyzine as needed itching May take 2 at night Call if not better in a few days We will call with Derm appointment

## 2017-03-20 DIAGNOSIS — G5602 Carpal tunnel syndrome, left upper limb: Secondary | ICD-10-CM | POA: Diagnosis not present

## 2017-03-20 DIAGNOSIS — G894 Chronic pain syndrome: Secondary | ICD-10-CM | POA: Diagnosis not present

## 2017-03-20 DIAGNOSIS — M47817 Spondylosis without myelopathy or radiculopathy, lumbosacral region: Secondary | ICD-10-CM | POA: Diagnosis not present

## 2017-03-20 DIAGNOSIS — G5601 Carpal tunnel syndrome, right upper limb: Secondary | ICD-10-CM | POA: Diagnosis not present

## 2017-03-20 DIAGNOSIS — Z79891 Long term (current) use of opiate analgesic: Secondary | ICD-10-CM | POA: Diagnosis not present

## 2017-03-26 DIAGNOSIS — M5412 Radiculopathy, cervical region: Secondary | ICD-10-CM | POA: Diagnosis not present

## 2017-03-26 DIAGNOSIS — G5603 Carpal tunnel syndrome, bilateral upper limbs: Secondary | ICD-10-CM | POA: Diagnosis not present
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
## 2017-04-18 ENCOUNTER — Ambulatory Visit (INDEPENDENT_AMBULATORY_CARE_PROVIDER_SITE_OTHER): Payer: Medicare Other | Admitting: Psychiatry

## 2017-04-18 ENCOUNTER — Encounter (HOSPITAL_COMMUNITY): Payer: Self-pay | Admitting: Psychiatry

## 2017-04-18 VITALS — BP 158/107 | HR 89 | Ht 64.0 in | Wt 181.0 lb

## 2017-04-18 DIAGNOSIS — Z79899 Other long term (current) drug therapy: Secondary | ICD-10-CM | POA: Diagnosis not present

## 2017-04-18 DIAGNOSIS — F411 Generalized anxiety disorder: Secondary | ICD-10-CM | POA: Diagnosis not present

## 2017-04-18 DIAGNOSIS — F410 Panic disorder [episodic paroxysmal anxiety] without agoraphobia: Secondary | ICD-10-CM | POA: Diagnosis not present

## 2017-04-18 MED ORDER — GABAPENTIN 400 MG PO CAPS: 400.0000 mg | ORAL_CAPSULE | Freq: Three times a day (TID) | ORAL | 0 refills | Status: DC

## 2017-04-18 MED ORDER — MIRTAZAPINE 30 MG PO TABS: 30.0000 mg | ORAL_TABLET | Freq: Every day | ORAL | 0 refills | Status: DC

## 2017-04-18 MED ORDER — ESCITALOPRAM OXALATE 20 MG PO TABS: 20.0000 mg | ORAL_TABLET | Freq: Every day | ORAL | 0 refills | Status: DC

## 2017-04-18 MED ORDER — TRAZODONE HCL 50 MG PO TABS: 50.0000 mg | ORAL_TABLET | Freq: Every evening | ORAL | 0 refills | Status: DC | PRN

## 2017-04-18 NOTE — Patient Instructions (Signed)
1.Continue mirtazapine 30 mg at night 2. Continue lexapro 20 mg daily 3. Increase gabapentin 400 mg three times a day 4. Continue Trazodone 50 mg at night as needed for sleep 5.Return to clinic intwo monthsfor 15 mins 6. Referral to therapy

## 2017-04-21 DIAGNOSIS — G894 Chronic pain syndrome: Secondary | ICD-10-CM | POA: Diagnosis not present

## 2017-04-21 DIAGNOSIS — G5602 Carpal tunnel syndrome, left upper limb: Secondary | ICD-10-CM | POA: Diagnosis not present

## 2017-04-21 DIAGNOSIS — G5601 Carpal tunnel syndrome, right upper limb: Secondary | ICD-10-CM | POA: Diagnosis not present

## 2017-04-21 DIAGNOSIS — M47817 Spondylosis without myelopathy or radiculopathy, lumbosacral region: Secondary | ICD-10-CM | POA: Diagnosis not present

## 2017-05-15 ENCOUNTER — Ambulatory Visit (INDEPENDENT_AMBULATORY_CARE_PROVIDER_SITE_OTHER): Payer: Medicare Other | Admitting: Psychiatry

## 2017-05-15 DIAGNOSIS — F411 Generalized anxiety disorder: Secondary | ICD-10-CM

## 2017-05-15 DIAGNOSIS — F410 Panic disorder [episodic paroxysmal anxiety] without agoraphobia: Secondary | ICD-10-CM | POA: Diagnosis not present

## 2017-05-16 ENCOUNTER — Encounter (HOSPITAL_COMMUNITY): Payer: Self-pay | Admitting: Psychiatry
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
## 2017-05-19 DIAGNOSIS — G894 Chronic pain syndrome: Secondary | ICD-10-CM | POA: Diagnosis not present

## 2017-05-19 DIAGNOSIS — G5601 Carpal tunnel syndrome, right upper limb: Secondary | ICD-10-CM | POA: Diagnosis not present

## 2017-05-19 DIAGNOSIS — M47817 Spondylosis without myelopathy or radiculopathy, lumbosacral region: Secondary | ICD-10-CM | POA: Diagnosis not present

## 2017-05-19 DIAGNOSIS — G5602 Carpal tunnel syndrome, left upper limb: Secondary | ICD-10-CM | POA: Diagnosis not present

## 2017-05-22 ENCOUNTER — Telehealth: Payer: Self-pay | Admitting: Family Medicine

## 2017-05-22 NOTE — Telephone Encounter (Signed)
Called and advised patient that we did not do antibiotics without a visit, and she needed to see dentist. Gave her an number to dentist in [place] that may be able to help her with no insurance.

## 2017-05-22 NOTE — Telephone Encounter (Signed)
Do not prescribe antibiotics without an office visit.  Call her dentist

## 2017-05-22 NOTE — Telephone Encounter (Signed)
Patient is requesting antibiotics for an abscess tooth. Pharmacy: [facility] in [place] Cb#: [phone]

## 2017-05-23 ENCOUNTER — Encounter (HOSPITAL_COMMUNITY): Payer: Self-pay | Admitting: Emergency Medicine

## 2017-05-23 ENCOUNTER — Other Ambulatory Visit: Payer: Self-pay

## 2017-05-23 ENCOUNTER — Emergency Department (HOSPITAL_COMMUNITY)
Admission: EM | Admit: 2017-05-23 | Discharge: 2017-05-23 | Disposition: A | Discharge status: Home / Self Care | Payer: Medicare Other | Attending: Emergency Medicine | Admitting: Emergency Medicine

## 2017-05-23 DIAGNOSIS — J4520 Mild intermittent asthma, uncomplicated: Secondary | ICD-10-CM | POA: Diagnosis not present

## 2017-05-23 DIAGNOSIS — K047 Periapical abscess without sinus: Secondary | ICD-10-CM | POA: Diagnosis not present

## 2017-05-23 DIAGNOSIS — I10 Essential (primary) hypertension: Secondary | ICD-10-CM | POA: Diagnosis not present

## 2017-05-23 DIAGNOSIS — K0889 Other specified disorders of teeth and supporting structures: Secondary | ICD-10-CM | POA: Diagnosis present

## 2017-05-23 DIAGNOSIS — Z79899 Other long term (current) drug therapy: Secondary | ICD-10-CM | POA: Diagnosis not present

## 2017-05-23 MED ORDER — IBUPROFEN 600 MG PO TABS: 600.0000 mg | ORAL_TABLET | Freq: Four times a day (QID) | ORAL | 0 refills | Status: DC | PRN

## 2017-05-23 MED ORDER — CLINDAMYCIN HCL 150 MG PO CAPS: 450.0000 mg | ORAL_CAPSULE | Freq: Three times a day (TID) | ORAL | 0 refills | Status: DC

## 2017-05-23 MED ORDER — CLINDAMYCIN HCL 150 MG PO CAPS
450.0000 mg | ORAL_CAPSULE | Freq: Once | ORAL | Status: AC
  Administered 2017-05-23: 450 mg via ORAL
  Filled 2017-05-23: qty 3

## 2017-05-23 MED ORDER — IBUPROFEN 800 MG PO TABS
800.0000 mg | ORAL_TABLET | Freq: Once | ORAL | Status: AC
  Administered 2017-05-23: 800 mg via ORAL
  Filled 2017-05-23: qty 1

## 2017-05-23 NOTE — Discharge Instructions (Signed)
Complete your entire course of antibiotics as prescribed.    Avoid applying heat or ice to this abscess area which can worsen your symptoms.  You may use warm salt water swish and spit treatment or half peroxide and water swish and spit after meals to keep this area clean as discussed.  Call the dentist listed above for further management of your symptoms.  

## 2017-05-23 NOTE — ED Triage Notes (Signed)
C/o lower left tooth pain, rating pain 9/10.  Swelling noted to jaw.  Took hydrocodone 10/325mg  at 0730 with no relief.  Also c/o nausea.

## 2017-05-24 NOTE — ED Provider Notes (Signed)
[facility] EMERGENCY DEPARTMENT Provider Note   CSN: [id number] Arrival date & time: 05/23/17  1108     History   Chief Complaint Chief Complaint  Patient presents with  . Dental Pain    HPI Claire [name] is a 47 y.o. female presenting with a 2 day history of dental pain and gingival swelling with mild nausea.   The patient has a history of decay in her left lower molar tooth which has recently started to cause increased  Pain and now swelling.  There has been no fevers, chills, vomiting, also no complaint of difficulty swallowing, although chewing makes pain worse.  The patient has taken her home hydrocodone without significant relief of symptoms.    .  The history is provided by the patient.    Past Medical History:  Diagnosis Date  . Acid reflux   . Allergy    pollen, dust  . Anxiety   . Arthritis   . Asthma   . Carpal tunnel syndrome    bilateral  . DDD (degenerative disc disease), cervical   . Depression   . Fibromyalgia   . Hypertension   . Migraine   . Palpitations   . Panic attacks   . Seizures ([place])    unknown etiology; last seizure was 2 years ago; on meds.  . Syncope and collapse   . Tennis elbow   . Ulcer   . Vertigo     Patient Active Problem List   Diagnosis Date Noted  . Eosinophilic esophagitis 92/01/9416  . H. pylori infection 12/02/2016  . Dysphagia 07/03/2016  . Constipation 07/03/2016  . Abdominal pain 07/03/2016  . Generalized anxiety disorder 06/17/2016  . Panic disorder 06/17/2016  . Asthma in adult, mild intermittent, uncomplicated 40/81/4481  . GERD without esophagitis 06/11/2016  . 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 Frequent falls 06/11/2016  . Vertigo 06/11/2016  . History of syncope 06/11/2016  . Fibromyalgia 07/28/2014    Past Surgical History:  Procedure Laterality Date  . BACK SURGERY  1993  . BIOPSY  10/21/2016   Procedure: BIOPSY;  Surgeon: Daneil Dolin, MD;  Location: [facility];  Service: Endoscopy;;  gastric,  esophagus  . CHOLECYSTECTOMY    . ESOPHAGOGASTRODUODENOSCOPY (EGD) WITH PROPOFOL N/A 10/21/2016   Procedure: ESOPHAGOGASTRODUODENOSCOPY (EGD) WITH PROPOFOL;  Surgeon: Daneil Dolin, MD;  Location: [facility];  Service: Endoscopy;  Laterality: N/A;  9:30am  . GALLBLADDER SURGERY    . SHOULDER SURGERY Right   . SPINE SURGERY  1993   lumbar disc surgery    OB History    No data available       Home Medications    Prior to Admission medications   Medication Sig Start Date End Date Taking? Authorizing Provider  albuterol (PROVENTIL HFA;VENTOLIN HFA) 108 (90 Base) MCG/ACT inhaler Inhale 1-2 puffs into the lungs every 6 (six) hours as needed for wheezing or shortness of breath.    Yes [provider]  carboxymethylcellulose (REFRESH PLUS) 0.5 % SOLN Place 1 drop into both eyes 2 (two) times daily.   Yes [provider]  diphenhydrAMINE (BENADRYL) 25 MG tablet Take 50 mg by mouth 2 (two) times daily.   Yes [provider]  escitalopram (LEXAPRO) 20 MG tablet Take 1 tablet (20 mg total) by mouth daily. 04/18/17  Yes Norman Clay, MD  gabapentin (NEURONTIN) 400 MG capsule Take 1 capsule (400 mg total) by mouth 3 (three) times daily. 04/18/17  Yes Norman Clay, MD  HYDROcodone-acetaminophen (  NORCO) 10-325 MG tablet Take 1 tablet by mouth every 6 (six) hours as needed for moderate pain.  05/16/17  Yes [provider]  linaclotide Rolan Lipa) 72 MCG capsule Take 1 capsule (72 mcg total) by mouth daily before breakfast. 07/18/16  Yes Carlis Stable, NP  metoprolol succinate (TOPROL-XL) 25 MG 24 hr tablet Take 1 tablet (25 mg total) by mouth daily. 09/13/16  Yes Raylene Everts, MD  mirtazapine (REMERON) 30 MG tablet Take 1 tablet (30 mg total) by mouth at bedtime. 04/18/17  Yes Norman Clay, MD  Multiple Vitamin (MULTIVITAMIN WITH MINERALS) TABS tablet Take 2 tablets by mouth daily.    Yes [provider]  omeprazole (PRILOSEC) 40 MG capsule Take 1 capsule (40  mg total) by mouth daily. 10/07/16  Yes Raylene Everts, MD  Potassium 99 MG TABS Take 198 mg by mouth daily.    Yes [provider]  Probiotic CAPS Take 2 capsules by mouth daily.    Yes [provider]  tiZANidine (ZANAFLEX) 2 MG tablet Take 2 mg by mouth 3 (three) times daily. 09/30/16  Yes [provider]  traZODone (DESYREL) 50 MG tablet Take 1 tablet (50 mg total) by mouth at bedtime as needed for sleep. 04/18/17  Yes Norman Clay, MD  clindamycin (CLEOCIN) 150 MG capsule Take 3 capsules (450 mg total) by mouth 3 (three) times daily. 05/23/17   Evalee Jefferson, PA-C  gabapentin (NEURONTIN) 300 MG capsule Take 1 capsule (300 mg total) by mouth 3 (three) times daily. Patient not taking: Reported on 05/23/2017 02/17/17   Norman Clay, MD  hydrocortisone 2.5 % cream Apply topically 2 (two) times daily. Patient not taking: Reported on 05/23/2017 03/17/17   Raylene Everts, MD  ibuprofen (ADVIL,MOTRIN) 600 MG tablet Take 1 tablet (600 mg total) by mouth every 6 (six) hours as needed. 05/23/17   Layal Javid, Almyra Free, PA-C  ondansetron (ZOFRAN ODT) 4 MG disintegrating tablet Take 1 tablet (4 mg total) by mouth every 8 (eight) hours as needed for nausea or vomiting. Patient not taking: Reported on 05/23/2017 03/03/17   Delia Heady, PA-C    Family History Family History  Problem Relation Age of Onset  . COPD Mother   . Bronchitis Mother   . Arthritis Mother   . Depression Mother   . Heart disease Mother   . Hypertension Mother   . Cancer - Colon Maternal Grandmother   . Alcohol abuse Father   . Early death Father        GSW  . PKU Son     Social History Social History   Tobacco Use  . Smoking status: Never Smoker  . Smokeless tobacco: Never Used  Substance Use Topics  . Alcohol use: Yes    Comment: drink occasionally  . Drug use: No     Allergies   Baclofen; Claritin [loratadine]; Cymbalta [duloxetine hcl]; Penicillins; Sulfa antibiotics; and Ultram  [tramadol]   Review of Systems Review of Systems  Constitutional: Negative for fever.  HENT: Positive for dental problem. Negative for facial swelling and sore throat.   Respiratory: Negative for shortness of breath.   Musculoskeletal: Negative for neck pain and neck stiffness.     Physical Exam Updated Vital Signs BP (!) 148/100 (BP Location: Right Arm)   Pulse 96   Temp 98 F (36.7 C) (Oral)   Resp 16   Ht 5\' 4"  (1.626 m)   Wt 79.4 kg (175 lb)   SpO2 99%   BMI 30.04  kg/m   Physical Exam  Constitutional: She is oriented to person, place, and time. She appears well-developed and well-nourished. No distress.  HENT:  Head: Normocephalic and atraumatic.  Right Ear: Tympanic membrane and external ear normal.  Left Ear: Tympanic membrane and external ear normal.  Mouth/Throat: Oropharynx is clear and moist and mucous membranes are normal. No oral lesions. No trismus in the jaw. Dental abscesses present.  Deep cavity in left lower 2nd molar.  Left induration along jawline, no fluctuance, no facial erythema.  Sublingual space soft.  Eyes: Conjunctivae are normal.  Neck: Normal range of motion. Neck supple.  Cardiovascular: Normal rate and normal heart sounds.  Pulmonary/Chest: Effort normal.  Abdominal: She exhibits no distension.  Musculoskeletal: Normal range of motion.  Lymphadenopathy:    She has no cervical adenopathy.  Neurological: She is alert and oriented to person, place, and time.  Skin: Skin is warm and dry. No erythema.  Psychiatric: She has a normal mood and affect.     ED Treatments / Results  Labs (all labs ordered are listed, but only abnormal results are displayed) Labs Reviewed - No data to display  EKG  EKG Interpretation None       Radiology No results found.  Procedures Procedures (including critical care time)  Medications Ordered in ED Medications  clindamycin (CLEOCIN) capsule 450 mg (450 mg Oral Given 05/23/17 1304)  ibuprofen  (ADVIL,MOTRIN) tablet 800 mg (800 mg Oral Given 05/23/17 1304)     Initial Impression / Assessment and Plan / ED Course  I have reviewed the triage vital signs and the nursing notes.  Pertinent labs & imaging results that were available during my care of the patient were reviewed by me and considered in my medical decision making (see chart for details).     Pt with dental abscess, not amenable to drainage. PCN allergic. Clindamycin, ibuprofen. Return precautions discussed. Plan f/u with dentistry, referrals given.  Final Clinical Impressions(s) / ED Diagnoses   Final diagnoses:  Dental abscess    ED Discharge Orders        Ordered    clindamycin (CLEOCIN) 150 MG capsule  3 times daily     05/23/17 1258    ibuprofen (ADVIL,MOTRIN) 600 MG tablet  Every 6 hours PRN     05/23/17 1258       Evalee Jefferson, PA-C 05/24/17 1540    Virgel Manifold, MD 05/29/17 [phone]

## 2017-05-26 ENCOUNTER — Encounter: Payer: Self-pay | Admitting: Family Medicine

## 2017-05-26 DIAGNOSIS — G5601 Carpal tunnel syndrome, right upper limb: Secondary | ICD-10-CM | POA: Diagnosis not present

## 2017-05-26 DIAGNOSIS — M1811 Unilateral primary osteoarthritis of first carpometacarpal joint, right hand: Secondary | ICD-10-CM | POA: Diagnosis not present

## 2017-05-26 DIAGNOSIS — G5602 Carpal tunnel syndrome, left upper limb: Secondary | ICD-10-CM | POA: Diagnosis not present

## 2017-05-27 DIAGNOSIS — D229 Melanocytic nevi, unspecified: Secondary | ICD-10-CM | POA: Diagnosis not present

## 2017-05-27 DIAGNOSIS — L309 Dermatitis, unspecified: Secondary | ICD-10-CM | POA: Diagnosis not present

## 2017-06-02 ENCOUNTER — Other Ambulatory Visit: Payer: Self-pay | Admitting: Nurse Practitioner

## 2017-06-02 ENCOUNTER — Encounter: Payer: Self-pay | Admitting: Nurse Practitioner

## 2017-06-02 ENCOUNTER — Ambulatory Visit (INDEPENDENT_AMBULATORY_CARE_PROVIDER_SITE_OTHER): Payer: Medicare Other | Admitting: Nurse Practitioner

## 2017-06-02 VITALS — BP 130/90 | HR 73 | Temp 97.5°F | Ht 64.0 in | Wt 187.4 lb

## 2017-06-02 DIAGNOSIS — K5900 Constipation, unspecified: Secondary | ICD-10-CM | POA: Diagnosis not present

## 2017-06-02 DIAGNOSIS — A048 Other specified bacterial intestinal infections: Secondary | ICD-10-CM

## 2017-06-02 DIAGNOSIS — K219 Gastro-esophageal reflux disease without esophagitis: Secondary | ICD-10-CM

## 2017-06-02 DIAGNOSIS — M7552 Bursitis of left shoulder: Secondary | ICD-10-CM | POA: Diagnosis not present

## 2017-06-02 DIAGNOSIS — M7551 Bursitis of right shoulder: Secondary | ICD-10-CM | POA: Diagnosis not present

## 2017-06-02 NOTE — Assessment & Plan Note (Signed)
H. pylori infection status post treatment with Pylera.  She has not completed her H. pylori breath test.  She states after 3 days her reflux became worse and she could not tolerate 2 weeks off.  I have encouraged her to make one more attempt to confirm eradication.  She will try.  We will give her a list of medication she can take for her reflux symptoms while she is off her PPI.  Return for follow-up in 1 year.

## 2017-06-02 NOTE — Progress Notes (Signed)
Referring Provider: Raylene Everts, MD Primary Care Physician:  Raylene Everts, MD Primary GI:  Dr. Gala Romney  Chief Complaint  Patient presents with  . Constipation    f/u. has about 1 loose stool per day  . h pylori    f/u.     HPI:   Claire [name] is a 47 y.o. female who presents for follow-up on H. pylori, constipation.  The patient was last seen in our office 12/02/2016 for the same as well as GERD, dysphagia, eosinophilic esophagitis.  Chronic history of GERD.    Last EGD completed 10/21/2016 which found crpe paper appearing esophageal mucosa with longitudinal furrows and a subtle ringed appearance of mucosa diffusely. No tumor or Barrett's esophagus, no reflux esophagitis. Stomach with antral erosions but no ulcers, patent pylorus, normal duodenum. 2 superficial tears were found in the mid esophagus corresponding to the area of critical narrowing. Biopsies were taking of the mid and distal esophagus for pathology. 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  Stomach biopsies found H. pylori treated with Pylera, esophageal biopsy with squamous mucosa with marked increase in epithelial eosinophils.  At her last visit she was doing better overall, completed H. pylori treatment, dysphagia resolved as was abdominal pain.  Constipation doing well on Linzess.  No other GI symptoms.  Recommended H. pylori breath test to confirm eradication, follow-up in 6 months.  It does not appear H. pylori breath test was completed.  Today she states she has not completed the H. Pylori breath test because after 3 days holding PPI she had significant symptoms. She will try again. GERD symptoms doing well if she takes PPI. Minimal/rare breakthrough, typically with spicy foods so she avoids these. Constipation is doing well, still on Linzess which gives her a soft/loose stool daily; she's ok with that. Did have a week of significant diarrhea after having a stomach bug. Denies abdominal pain, N/V, hematochezia, melena, fever, chills,  unintentional weight loss. States she has difficulty losing weight. Hasn't addressed this with her PCP. Denies chest pain, dyspnea, dizziness, lightheadedness, syncope, near syncope. Denies any other upper or lower GI symptoms.  Past Medical History:  Diagnosis Date  . Acid reflux   . Allergy    pollen, dust  . Anxiety   . Arthritis   . Asthma   . Carpal tunnel syndrome    bilateral  . DDD (degenerative disc disease), cervical   . Depression   . Fibromyalgia   . Hypertension   . Migraine   . Palpitations   . Panic attacks   . Seizures ([place])    unknown etiology; last seizure was 2 years ago; on meds.  . Syncope and collapse   . Tennis elbow   . Ulcer   . Vertigo     Past Surgical History:  Procedure Laterality Date  . BACK SURGERY  1993  . BIOPSY  10/21/2016   Procedure: BIOPSY;  Surgeon: Daneil Dolin, MD;  Location: [facility];  Service: Endoscopy;;  gastric, esophagus  . CHOLECYSTECTOMY    . ESOPHAGOGASTRODUODENOSCOPY (EGD) WITH PROPOFOL N/A 10/21/2016   Procedure: ESOPHAGOGASTRODUODENOSCOPY (EGD) WITH PROPOFOL;  Surgeon: Daneil Dolin, MD;  Location: [facility];  Service: Endoscopy;  Laterality: N/A;  9:30am  . GALLBLADDER SURGERY    . SHOULDER SURGERY Right   . SPINE SURGERY  1993   lumbar disc surgery    Current Outpatient Medications  Medication Sig Dispense Refill  . albuterol (PROVENTIL HFA;VENTOLIN HFA) 108 (90 Base) MCG/ACT inhaler Inhale 1-2  puffs into the lungs every 6 (six) hours as needed for wheezing or shortness of breath.     . carboxymethylcellulose (REFRESH PLUS) 0.5 % SOLN Place 1 drop into both eyes 2 (two) times daily.    . diphenhydrAMINE (BENADRYL) 25 MG tablet Take 50 mg by mouth 2 (two) times daily.    Marland Kitchen escitalopram (LEXAPRO) 20 MG tablet Take 1 tablet (20 mg total) by mouth daily. 90 tablet 0  . gabapentin (NEURONTIN) 400 MG capsule Take 1 capsule (400 mg total) by mouth 3 (three) times daily. 270 capsule 0  . HYDROcodone-acetaminophen  (NORCO) 10-325 MG tablet Take 1 tablet by mouth every 6 (six) hours as needed for moderate pain.     Marland Kitchen ibuprofen (ADVIL,MOTRIN) 600 MG tablet Take 1 tablet (600 mg total) by mouth every 6 (six) hours as needed. 30 tablet 0  . linaclotide (LINZESS) 72 MCG capsule Take 1 capsule (72 mcg total) by mouth daily before breakfast. 90 capsule 2  . metoprolol succinate (TOPROL-XL) 25 MG 24 hr tablet Take 1 tablet (25 mg total) by mouth daily. 90 tablet 3  . mirtazapine (REMERON) 30 MG tablet Take 1 tablet (30 mg total) by mouth at bedtime. 90 tablet 0  . Multiple Vitamin (MULTIVITAMIN WITH MINERALS) TABS tablet Take 2 tablets by mouth daily.     Marland Kitchen omeprazole (PRILOSEC) 40 MG capsule Take 1 capsule (40 mg total) by mouth daily. 90 capsule 1  . Potassium 99 MG TABS Take 198 mg by mouth daily.     Marland Kitchen tiZANidine (ZANAFLEX) 2 MG tablet Take 2 mg by mouth 3 (three) times daily.    . traZODone (DESYREL) 50 MG tablet Take 1 tablet (50 mg total) by mouth at bedtime as needed for sleep. 90 tablet 0   No current facility-administered medications for this visit.     Allergies as of 06/02/2017 - Review Complete 06/02/2017  Allergen Reaction Noted  . Baclofen Shortness Of Breath 10/10/2016  . Claritin [loratadine] Other (See Comments) 07/28/2014  . Cymbalta [duloxetine hcl] Hives 07/29/2012  . Penicillins Hives 07/29/2012  . Sulfa antibiotics Hives 07/29/2012  . Ultram [tramadol] Other (See Comments) 09/12/2014    Family History  Problem Relation Age of Onset  . COPD Mother   . Bronchitis Mother   . Arthritis Mother   . Depression Mother   . Heart disease Mother   . Hypertension Mother   . Colon cancer Maternal Grandmother   . Alcohol abuse Father   . Early death Father        GSW  . PKU Son     Social History   Socioeconomic History  . Marital status: Single    Spouse name: None  . Number of children: 1  . Years of education: HS  . Highest education level: None  Social Needs  . Financial  resource strain: None  . Food insecurity - worry: None  . Food insecurity - inability: None  . Transportation needs - medical: None  . Transportation needs - non-medical: None  Occupational History  . Occupation: disability    Comment: unemployed  Tobacco Use  . Smoking status: Never Smoker  . Smokeless tobacco: Never Used  Substance and Sexual Activity  . Alcohol use: Yes    Comment: drink occasionally  . Drug use: No  . Sexual activity: Not Currently    Partners: Female    Birth control/protection: None  Other Topics Concern  . None  Social History Narrative   Patient is left  handed.   Patient drinks very little caffeine.   Lives alone    Review of Systems: General: Negative for anorexia, weight loss, fever, chills, fatigue, weakness. ENT: Negative for hoarseness, difficulty swallowing. CV: Negative for chest pain, angina, palpitations, peripheral edema.  Respiratory: Negative for dyspnea at rest, cough, sputum, wheezing.  GI: See history of present illness. Endo: Negative for unusual weight change.  Heme: Negative for bruising or bleeding.   Physical Exam: BP 130/90   Pulse 73   Temp (!) 97.5 F (36.4 C) (Oral)   Ht 5\' 4"  (1.626 m)   Wt 187 lb 6.4 oz (85 kg)   LMP 03/18/2017 (Approximate)   BMI 32.17 kg/m  General:   Obese female. Alert and oriented. Pleasant and cooperative. Well-nourished and well-developed.  Eyes:  Without icterus, sclera clear and conjunctiva pink.  Ears:  Normal auditory acuity. Cardiovascular:  S1, S2 present without murmurs appreciated. Extremities without clubbing or edema. Respiratory:  Clear to auscultation bilaterally. No wheezes, rales, or rhonchi. No distress.  Gastrointestinal:  +BS, obese but soft, non-tender and non-distended. No HSM noted. No guarding or rebound. No masses appreciated.  Rectal:  Deferred  Musculoskalatal:  Symmetrical without gross deformities. Neurologic:  Alert and oriented x4;  grossly normal  neurologically. Psych:  Alert and cooperative. Normal mood and affect. Heme/Lymph/Immune: No excessive bruising noted.    06/02/2017 9:23 AM   Disclaimer: This note was dictated with voice recognition software. Similar sounding words can inadvertently be transcribed and may not be corrected upon review.

## 2017-06-02 NOTE — Assessment & Plan Note (Signed)
Constipation is doing well on Linzess.  Having one bowel movement a day which is loose to soft.  She states she is satisfied with the result.  She does not mind having soft to loose bowel movement since her constipation is resolved.  Recommend she continue Linzess 72 mcg daily.  Call if any worsening symptoms.  Otherwise, return for follow-up in 1 year.

## 2017-06-02 NOTE — Patient Instructions (Signed)
1. Continue taking your current medications.  Specifically, continue Prilosec and Linzess. 2. Attempt to have your breath test completed.  You will need to be off your acid blocker for 2 weeks.  We will let you know what medications you are not able to take for 2 weeks prior to the breath test. 3. Call us and let us know if you are able to tolerate/complete the breath test. 4. Follow-up in 1 year. 5. Call us if you have any worsening symptoms before then. 6. Call us if you have any questions or concerns   Have a Great Summer!

## 2017-06-02 NOTE — Assessment & Plan Note (Signed)
GERD symptoms are doing well on PPI.  Recommend she continue her current medications.  Follow-up in 1 year.  Call if any worsening symptoms before then.

## 2017-06-02 NOTE — Progress Notes (Signed)
cc'ed to pcp °

## 2017-06-06 DIAGNOSIS — G5602 Carpal tunnel syndrome, left upper limb: Secondary | ICD-10-CM | POA: Diagnosis not present
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
## 2017-06-16 ENCOUNTER — Ambulatory Visit (INDEPENDENT_AMBULATORY_CARE_PROVIDER_SITE_OTHER): Payer: Medicare Other | Admitting: Psychiatry

## 2017-06-16 ENCOUNTER — Encounter (HOSPITAL_COMMUNITY): Payer: Self-pay | Admitting: Psychiatry

## 2017-06-16 VITALS — BP 125/87 | HR 68 | Ht 64.0 in | Wt 179.0 lb

## 2017-06-16 DIAGNOSIS — G4700 Insomnia, unspecified: Secondary | ICD-10-CM | POA: Diagnosis not present

## 2017-06-16 DIAGNOSIS — F411 Generalized anxiety disorder: Secondary | ICD-10-CM | POA: Diagnosis not present

## 2017-06-16 DIAGNOSIS — M797 Fibromyalgia: Secondary | ICD-10-CM | POA: Diagnosis not present

## 2017-06-16 DIAGNOSIS — F410 Panic disorder [episodic paroxysmal anxiety] without agoraphobia: Secondary | ICD-10-CM

## 2017-06-16 DIAGNOSIS — R450 Nervousness: Secondary | ICD-10-CM

## 2017-06-16 DIAGNOSIS — Z811 Family history of alcohol abuse and dependence: Secondary | ICD-10-CM

## 2017-06-16 DIAGNOSIS — Z818 Family history of other mental and behavioral disorders: Secondary | ICD-10-CM | POA: Diagnosis not present

## 2017-06-16 DIAGNOSIS — Z736 Limitation of activities due to disability: Secondary | ICD-10-CM

## 2017-06-16 MED ORDER — GABAPENTIN 400 MG PO CAPS: 400.0000 mg | ORAL_CAPSULE | Freq: Three times a day (TID) | ORAL | 0 refills | Status: DC

## 2017-06-16 MED ORDER — ESCITALOPRAM OXALATE 20 MG PO TABS
20.0000 mg | ORAL_TABLET | Freq: Every day | ORAL | 0 refills | Status: DC
Start: 2017-06-16 — End: 2017-09-15

## 2017-06-16 MED ORDER — TRAZODONE HCL 50 MG PO TABS: 50.0000 mg | ORAL_TABLET | Freq: Every evening | ORAL | 0 refills | Status: DC | PRN

## 2017-06-16 MED ORDER — MIRTAZAPINE 30 MG PO TABS: 30.0000 mg | ORAL_TABLET | Freq: Every day | ORAL | 0 refills | Status: DC

## 2017-06-16 NOTE — Patient Instructions (Signed)
1.Continuemirtazapine 30mg  at night 2. Continue lexapro 20 mg daily 3. Continuegabapentin 400 mg three times a day 4. Continue Trazodone 50 mg at night as needed for sleep 5. Return to clinic in three months for 15 mins

## 2017-06-18 ENCOUNTER — Telehealth: Payer: Self-pay

## 2017-06-18 DIAGNOSIS — M47817 Spondylosis without myelopathy or radiculopathy, lumbosacral region: Secondary | ICD-10-CM | POA: Diagnosis not present

## 2017-06-18 DIAGNOSIS — G894 Chronic pain syndrome: Secondary | ICD-10-CM | POA: Diagnosis not present

## 2017-06-18 DIAGNOSIS — G5601 Carpal tunnel syndrome, right upper limb: Secondary | ICD-10-CM | POA: Diagnosis not present

## 2017-06-18 DIAGNOSIS — G4700 Insomnia, unspecified: Secondary | ICD-10-CM | POA: Diagnosis not present

## 2017-06-18 NOTE — Telephone Encounter (Signed)
Faxed [employer] Patient Assistance Program Enrollment Form to secure fax [phone] and mailed form to pre addressed form to [facility] [address] [place] [address]

## 2017-06-23 ENCOUNTER — Other Ambulatory Visit: Payer: Self-pay | Admitting: Dermatology

## 2017-06-23 DIAGNOSIS — T7840XA Allergy, unspecified, initial encounter: Secondary | ICD-10-CM | POA: Diagnosis not present

## 2017-06-23 DIAGNOSIS — D2262 Melanocytic nevi of left upper limb, including shoulder: Secondary | ICD-10-CM | POA: Diagnosis not present

## 2017-06-23 DIAGNOSIS — D485 Neoplasm of uncertain behavior of skin: Secondary | ICD-10-CM | POA: Diagnosis not present

## 2017-06-23 DIAGNOSIS — L309 Dermatitis, unspecified: Secondary | ICD-10-CM | POA: Diagnosis not present

## 2017-07-03 DIAGNOSIS — L239 Allergic contact dermatitis, unspecified cause: Secondary | ICD-10-CM | POA: Diagnosis not present

## 2017-07-03 DIAGNOSIS — D485 Neoplasm of uncertain behavior of skin: Secondary | ICD-10-CM | POA: Diagnosis not present

## 2017-07-03 DIAGNOSIS — Z79899 Other long term (current) drug therapy: Secondary | ICD-10-CM | POA: Diagnosis not present

## 2017-07-03 DIAGNOSIS — L309 Dermatitis, unspecified: Secondary | ICD-10-CM | POA: Diagnosis not present

## 2017-07-17 DIAGNOSIS — H35033 Hypertensive retinopathy, bilateral: Secondary | ICD-10-CM | POA: Diagnosis not present

## 2017-07-21 DIAGNOSIS — M47817 Spondylosis without myelopathy or radiculopathy, lumbosacral region: Secondary | ICD-10-CM | POA: Diagnosis not present

## 2017-07-21 DIAGNOSIS — G4700 Insomnia, unspecified: Secondary | ICD-10-CM | POA: Diagnosis not present

## 2017-07-21 DIAGNOSIS — Z79899 Other long term (current) drug therapy: Secondary | ICD-10-CM | POA: Diagnosis not present

## 2017-07-21 DIAGNOSIS — G5601 Carpal tunnel syndrome, right upper limb: Secondary | ICD-10-CM | POA: Diagnosis not present

## 2017-07-21 DIAGNOSIS — G894 Chronic pain syndrome: Secondary | ICD-10-CM | POA: Diagnosis not present

## 2017-07-21 DIAGNOSIS — Z79891 Long term (current) use of opiate analgesic: Secondary | ICD-10-CM | POA: Diagnosis not present

## 2017-07-21 DIAGNOSIS — L239 Allergic contact dermatitis, unspecified cause: Secondary | ICD-10-CM | POA: Diagnosis not present

## 2017-07-23 ENCOUNTER — Encounter (HOSPITAL_COMMUNITY): Payer: Self-pay | Admitting: Psychiatry

## 2017-07-23 ENCOUNTER — Ambulatory Visit (INDEPENDENT_AMBULATORY_CARE_PROVIDER_SITE_OTHER): Payer: Medicare Other | Admitting: Psychiatry

## 2017-07-23 DIAGNOSIS — F411 Generalized anxiety disorder: Secondary | ICD-10-CM
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
## 2017-08-04 DIAGNOSIS — L309 Dermatitis, unspecified: Secondary | ICD-10-CM | POA: Diagnosis not present

## 2017-08-19 DIAGNOSIS — G894 Chronic pain syndrome: Secondary | ICD-10-CM | POA: Diagnosis not present

## 2017-08-19 DIAGNOSIS — G4700 Insomnia, unspecified: Secondary | ICD-10-CM | POA: Diagnosis not present

## 2017-08-19 DIAGNOSIS — M47817 Spondylosis without myelopathy or radiculopathy, lumbosacral region: Secondary | ICD-10-CM | POA: Diagnosis not present

## 2017-08-19 DIAGNOSIS — G5601 Carpal tunnel syndrome, right upper limb: Secondary | ICD-10-CM | POA: Diagnosis not present

## 2017-08-22 DIAGNOSIS — M47812 Spondylosis without myelopathy or radiculopathy, cervical region: Secondary | ICD-10-CM | POA: Diagnosis not present

## 2017-08-22 DIAGNOSIS — R262 Difficulty in walking, not elsewhere classified: Secondary | ICD-10-CM | POA: Diagnosis not present

## 2017-08-22 DIAGNOSIS — M47816 Spondylosis without myelopathy or radiculopathy, lumbar region: Secondary | ICD-10-CM | POA: Diagnosis not present

## 2017-09-05 ENCOUNTER — Ambulatory Visit (INDEPENDENT_AMBULATORY_CARE_PROVIDER_SITE_OTHER): Payer: Medicare Other | Admitting: Psychiatry

## 2017-09-05 ENCOUNTER — Encounter (HOSPITAL_COMMUNITY): Payer: Self-pay | Admitting: Psychiatry

## 2017-09-05 DIAGNOSIS — F411 Generalized anxiety disorder: Secondary | ICD-10-CM | POA: Diagnosis not present

## 2017-09-05 DIAGNOSIS — F4310 Post-traumatic stress disorder, unspecified: Secondary | ICD-10-CM
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
## 2017-09-15 ENCOUNTER — Encounter (HOSPITAL_COMMUNITY): Payer: Self-pay | Admitting: Psychiatry

## 2017-09-15 ENCOUNTER — Other Ambulatory Visit: Payer: Self-pay | Admitting: Family Medicine

## 2017-09-15 ENCOUNTER — Ambulatory Visit (INDEPENDENT_AMBULATORY_CARE_PROVIDER_SITE_OTHER): Payer: Medicare Other | Admitting: Psychiatry

## 2017-09-15 VITALS — BP 150/104 | HR 88 | Ht 64.0 in | Wt 184.0 lb

## 2017-09-15 DIAGNOSIS — F411 Generalized anxiety disorder: Secondary | ICD-10-CM

## 2017-09-15 DIAGNOSIS — F410 Panic disorder [episodic paroxysmal anxiety] without agoraphobia: Secondary | ICD-10-CM | POA: Diagnosis not present

## 2017-09-15 MED ORDER — ESCITALOPRAM OXALATE 20 MG PO TABS: 20.0000 mg | ORAL_TABLET | Freq: Every day | ORAL | 0 refills | Status: DC

## 2017-09-15 MED ORDER — GABAPENTIN 400 MG PO CAPS: 400.0000 mg | ORAL_CAPSULE | Freq: Three times a day (TID) | ORAL | 0 refills | Status: DC

## 2017-09-15 MED ORDER — TRAZODONE HCL 100 MG PO TABS: 100.0000 mg | ORAL_TABLET | Freq: Every evening | ORAL | 0 refills | Status: DC | PRN

## 2017-09-15 MED ORDER — MIRTAZAPINE 45 MG PO TABS: 45.0000 mg | ORAL_TABLET | Freq: Every day | ORAL | 0 refills | Status: DC

## 2017-09-15 NOTE — Patient Instructions (Signed)
1.Increasemirtazapine 45mg  at night 2. Continue lexapro 20 mg daily 3.Continuegabapentin400 mg three times a day 4. Continue Trazodone 50 mg at night as needed for sleep 5. Return to clinic in three months for 15 mins

## 2017-09-22 ENCOUNTER — Other Ambulatory Visit: Payer: Self-pay | Admitting: Family Medicine

## 2017-09-24 ENCOUNTER — Ambulatory Visit (HOSPITAL_COMMUNITY): Payer: Self-pay | Admitting: Psychiatry

## 2017-10-15 ENCOUNTER — Ambulatory Visit (INDEPENDENT_AMBULATORY_CARE_PROVIDER_SITE_OTHER): Payer: Medicare Other | Admitting: Psychiatry

## 2017-10-15 DIAGNOSIS — F411 Generalized anxiety disorder: Secondary | ICD-10-CM

## 2017-10-15 DIAGNOSIS — F4310 Post-traumatic stress disorder, unspecified: Secondary | ICD-10-CM | POA: Diagnosis not present
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
## 2017-10-29 ENCOUNTER — Ambulatory Visit (HOSPITAL_COMMUNITY): Payer: Medicare Other | Admitting: Psychiatry

## 2017-12-10 ENCOUNTER — Emergency Department (HOSPITAL_COMMUNITY)
Admission: EM | Admit: 2017-12-10 | Discharge: 2017-12-10 | Disposition: A | Discharge status: Home / Self Care | Payer: Medicare Other | Attending: Emergency Medicine | Admitting: Emergency Medicine

## 2017-12-10 ENCOUNTER — Encounter (HOSPITAL_COMMUNITY): Payer: Self-pay | Admitting: Emergency Medicine

## 2017-12-10 ENCOUNTER — Other Ambulatory Visit: Payer: Self-pay

## 2017-12-10 DIAGNOSIS — G43909 Migraine, unspecified, not intractable, without status migrainosus: Secondary | ICD-10-CM | POA: Diagnosis not present

## 2017-12-10 DIAGNOSIS — J45909 Unspecified asthma, uncomplicated: Secondary | ICD-10-CM | POA: Insufficient documentation

## 2017-12-10 DIAGNOSIS — Z79899 Other long term (current) drug therapy: Secondary | ICD-10-CM | POA: Diagnosis not present

## 2017-12-10 DIAGNOSIS — F329 Major depressive disorder, single episode, unspecified: Secondary | ICD-10-CM | POA: Insufficient documentation

## 2017-12-10 DIAGNOSIS — I10 Essential (primary) hypertension: Secondary | ICD-10-CM | POA: Diagnosis present

## 2017-12-10 DIAGNOSIS — Z9049 Acquired absence of other specified parts of digestive tract: Secondary | ICD-10-CM | POA: Diagnosis not present

## 2017-12-10 DIAGNOSIS — F419 Anxiety disorder, unspecified: Secondary | ICD-10-CM | POA: Insufficient documentation

## 2017-12-10 DIAGNOSIS — G43019 Migraine without aura, intractable, without status migrainosus: Secondary | ICD-10-CM

## 2017-12-10 HISTORY — DX: Post-traumatic stress disorder, unspecified: F43.10

## 2017-12-10 LAB — CBC
HCT: 43.6 % (ref 36.0–46.0)
Hemoglobin: 15 g/dL (ref 12.0–15.0)
MCH: 32 pg (ref 26.0–34.0)
MCHC: 34.4 g/dL (ref 30.0–36.0)
MCV: 93 fL (ref 78.0–100.0)
Platelets: 379 10*3/uL (ref 150–400)
RBC: 4.69 MIL/uL (ref 3.87–5.11)
RDW: 14.3 % (ref 11.5–15.5)
WBC: 10.3 10*3/uL (ref 4.0–10.5)

## 2017-12-10 LAB — BASIC METABOLIC PANEL
Anion gap: 9 (ref 5–15)
BUN: 8 mg/dL (ref 6–20)
CO2: 24 mmol/L (ref 22–32)
Calcium: 9.1 mg/dL (ref 8.9–10.3)
Chloride: 105 mmol/L (ref 98–111)
Creatinine, Ser: 0.8 mg/dL (ref 0.44–1.00)
GFR calc Af Amer: >60 mL/min (ref >60)
GFR calc non Af Amer: >60 mL/min (ref >60)
Glucose, Bld: 99 mg/dL (ref 70–99)
Potassium: 3.6 mmol/L (ref 3.5–5.1)
Sodium: 138 mmol/L (ref 135–145)

## 2017-12-10 MED ORDER — METOPROLOL SUCCINATE ER 25 MG PO TB24: 25.0000 mg | ORAL_TABLET | Freq: Every day | ORAL | 1 refills | Status: DC

## 2017-12-10 MED ORDER — SODIUM CHLORIDE 0.9 % IV BOLUS
1000.0000 mL | Freq: Once | INTRAVENOUS | Status: AC
  Administered 2017-12-10: 1000 mL via INTRAVENOUS

## 2017-12-10 MED ORDER — DIPHENHYDRAMINE HCL 50 MG/ML IJ SOLN
25.0000 mg | Freq: Once | INTRAMUSCULAR | Status: AC
  Administered 2017-12-10: 25 mg via INTRAVENOUS
  Filled 2017-12-10: qty 1

## 2017-12-10 MED ORDER — SODIUM CHLORIDE 0.9 % IV SOLN
INTRAVENOUS | Status: DC
Start: 2017-12-10 — End: 2017-12-10

## 2017-12-10 MED ORDER — METOPROLOL SUCCINATE ER 25 MG PO TB24
25.0000 mg | ORAL_TABLET | Freq: Every day | ORAL | Status: DC
  Administered 2017-12-10: 25 mg via ORAL
  Filled 2017-12-10: qty 1

## 2017-12-10 MED ORDER — METOCLOPRAMIDE HCL 5 MG/ML IJ SOLN
10.0000 mg | Freq: Once | INTRAMUSCULAR | Status: AC
  Administered 2017-12-10: 10 mg via INTRAVENOUS
  Filled 2017-12-10: qty 2

## 2017-12-10 MED ORDER — DEXAMETHASONE SODIUM PHOSPHATE 4 MG/ML IJ SOLN
10.0000 mg | Freq: Once | INTRAMUSCULAR | Status: AC
  Administered 2017-12-10: 10 mg via INTRAVENOUS
  Filled 2017-12-10: qty 3

## 2017-12-10 NOTE — ED Triage Notes (Signed)
Pt reports was sent from pain clinic regarding elevated blood pressure, bilateral "ringing" in ears, headache. Pt denies chest pain, shortness of breath. nad noted.

## 2017-12-10 NOTE — ED Provider Notes (Signed)
[facility] EMERGENCY DEPARTMENT Provider Note   CSN: [id number] Arrival date & time: 12/10/17  1547     History   Chief Complaint Chief Complaint  Patient presents with  . Headache    HPI Claire [name] is a 47 y.o. female.  Patient sent in from pain management for elevated blood pressures.  Patient did not have a headache at the time.  Upon arrival here she developed a headache that was typical for her migraines.  With some light sensitivity and some nausea headache was global in nature and patient states typical for her migraines.  Patient has a history of hypertension she been out of her hypertensive meds.  For several weeks.  She does have a primary care doctor to follow-up with.     Past Medical History:  Diagnosis Date  . Acid reflux   . Allergy    pollen, dust  . Anxiety   . Arthritis   . Asthma   . Carpal tunnel syndrome    bilateral  . DDD (degenerative disc disease), cervical   . Depression   . Fibromyalgia   . Hypertension   . Migraine   . Palpitations   . Panic attacks   . PTSD (post-traumatic stress disorder)   . Seizures ([place])    unknown etiology; last seizure was 2 years ago; on meds.  . Syncope and collapse   . Tennis elbow   . Ulcer   . Vertigo     Patient Active Problem List   Diagnosis Date Noted  . Eosinophilic esophagitis 24/40/1027  . H. pylori infection 12/02/2016  . Dysphagia 07/03/2016  . Constipation 07/03/2016  . Abdominal pain 07/03/2016  . Generalized anxiety disorder 06/17/2016  . Panic disorder 06/17/2016  . Asthma in adult, mild intermittent, uncomplicated 25/36/6440  . GERD without esophagitis 06/11/2016  . 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 Frequent falls 06/11/2016  . Vertigo 06/11/2016  . History of syncope 06/11/2016  . Fibromyalgia 07/28/2014    Past Surgical History:  Procedure Laterality Date  . BACK SURGERY  1993  . BIOPSY  10/21/2016   Procedure: BIOPSY;  Surgeon: Daneil Dolin, MD;  [facility];   Service: Endoscopy;;  gastric, esophagus  . CHOLECYSTECTOMY    . ESOPHAGOGASTRODUODENOSCOPY (EGD) WITH PROPOFOL N/A 10/21/2016   Procedure: ESOPHAGOGASTRODUODENOSCOPY (EGD) WITH PROPOFOL;  Surgeon: Daneil Dolin, MD;  [facility];  Service: Endoscopy;  Laterality: N/A;  9:30am  . GALLBLADDER SURGERY    . SHOULDER SURGERY Right   . SPINE SURGERY  1993   lumbar disc surgery     OB History   None      Home Medications    Prior to Admission medications   Medication Sig Start Date End Date Taking? Authorizing Provider  albuterol (PROVENTIL HFA;VENTOLIN HFA) 108 (90 Base) MCG/ACT inhaler Inhale 1-2 puffs into the lungs every 6 (six) hours as needed for wheezing or shortness of breath.     [provider]  carboxymethylcellulose (REFRESH PLUS) 0.5 % SOLN Place 1 drop into both eyes 2 (two) times daily.    [provider]  diphenhydrAMINE (BENADRYL) 25 MG tablet Take 50 mg by mouth 2 (two) times daily.    [provider]  escitalopram (LEXAPRO) 20 MG tablet Take 1 tablet (20 mg total) by mouth daily. 09/15/17   Norman Clay, MD  gabapentin (NEURONTIN) 400 MG capsule Take 1 capsule (400 mg total) by mouth 3 (three) times daily. 09/15/17   Norman Clay, MD  HYDROcodone-acetaminophen (NORCO) 10-325 MG tablet Take 1 tablet by mouth every 6 (six) hours as needed for moderate pain.  05/16/17   [provider]  ibuprofen (ADVIL,MOTRIN) 600 MG tablet Take 1 tablet (600 mg total) by mouth every 6 (six) hours as needed. 05/23/17   Evalee Jefferson, PA-C  LINZESS 72 MCG capsule TAKE 1 CAPSULE BY MOUTH ONCE DAILY BEFORE BREAKFAST 06/03/17   Carlis Stable, NP  metoprolol succinate (TOPROL-XL) 25 MG 24 hr tablet Take 1 tablet (25 mg total) by mouth daily. 09/13/16   Raylene Everts, MD  metoprolol succinate (TOPROL-XL) 25 MG 24 hr tablet Take 1 tablet (25 mg total) by mouth daily. 12/10/17   Fredia Sorrow, MD  mirtazapine (REMERON) 45 MG tablet Take 1 tablet (45 mg  total) by mouth at bedtime. 09/15/17   Norman Clay, MD  Multiple Vitamin (MULTIVITAMIN WITH MINERALS) TABS tablet Take 2 tablets by mouth daily.     [provider]  omeprazole (PRILOSEC) 40 MG capsule Take 1 capsule (40 mg total) by mouth daily. 10/07/16   Raylene Everts, MD  Potassium 99 MG TABS Take 198 mg by mouth daily.     [provider]  tiZANidine (ZANAFLEX) 2 MG tablet Take 2 mg by mouth 3 (three) times daily. 09/30/16   [provider]  traZODone (DESYREL) 100 MG tablet Take 1 tablet (100 mg total) by mouth at bedtime as needed for sleep. 09/15/17   Norman Clay, MD    Family History Family History  Problem Relation Age of Onset  . COPD Mother   . Bronchitis Mother   . Arthritis Mother   . Depression Mother   . Heart disease Mother   . Hypertension Mother   . Colon cancer Maternal Grandmother   . Alcohol abuse Father   . Early death Father        GSW  . PKU Son     Social History Social History   Tobacco Use  . Smoking status: Never Smoker  . Smokeless tobacco: Never Used  Substance Use Topics  . Alcohol use: Yes    Comment: drink occasionally  . Drug use: No     Allergies   Baclofen; Claritin [loratadine]; Cymbalta [duloxetine hcl]; Penicillins; Sulfa antibiotics; and Ultram [tramadol]   Review of Systems Review of Systems  Constitutional: Negative for fever.  HENT: Negative for congestion.   Eyes: Positive for photophobia. Negative for visual disturbance.  Respiratory: Negative for shortness of breath.   Cardiovascular: Negative for chest pain.  Gastrointestinal: Positive for nausea. Negative for abdominal pain.  Genitourinary: Negative for dysuria.  Musculoskeletal: Positive for myalgias and neck pain.  Skin: Negative for rash.  Neurological: Positive for headaches. Negative for dizziness, syncope, facial asymmetry, speech difficulty, weakness, light-headedness and numbness.  Hematological: Does not bruise/bleed easily.    Psychiatric/Behavioral: Negative for confusion.     Physical Exam Updated Vital Signs BP (!) 164/106   Pulse 73   Temp 98 F (36.7 C) (Oral)   Resp 17   Ht 1.626 m (5\' 4" )   Wt 79.4 kg   LMP 09/03/2017   SpO2 98%   BMI 30.04 kg/m   Physical Exam  Constitutional: She is oriented to person, place, and time. She appears well-developed and well-nourished. No distress.  HENT:  Head: Normocephalic and atraumatic.  Mouth/Throat: Oropharynx is clear and moist.  Eyes: Pupils are equal, round, and reactive to light. Conjunctivae and EOM are normal.  Neck: Normal range of motion. Neck supple.  Cardiovascular: Normal rate, regular rhythm and normal heart sounds.  Pulmonary/Chest: Effort normal and breath sounds normal. No respiratory distress.  Abdominal: Soft. Bowel sounds are normal. There is no tenderness.  Musculoskeletal: Normal range of motion. She exhibits no edema.  Neurological: She is alert and oriented to person, place, and time. No cranial nerve deficit or sensory deficit. She exhibits normal muscle tone. Coordination normal.  Skin: Skin is warm. No rash noted.  Nursing note and vitals reviewed.    ED Treatments / Results  Labs (all labs ordered are listed, but only abnormal results are displayed) Labs Reviewed  CBC  BASIC METABOLIC PANEL    EKG None  Radiology No results found.  Procedures Procedures (including critical care time)  Medications Ordered in ED Medications  0.9 %  sodium chloride infusion ( Intravenous Refused 12/10/17 1927)  metoprolol succinate (TOPROL-XL) 24 hr tablet 25 mg (25 mg Oral Given 12/10/17 1829)  sodium chloride 0.9 % bolus 1,000 mL (1,000 mLs Intravenous New Bag/Given 12/10/17 1833)  dexamethasone (DECADRON) injection 10 mg (10 mg Intravenous Given 12/10/17 1829)  metoCLOPramide (REGLAN) injection 10 mg (10 mg Intravenous Given 12/10/17 1829)  diphenhydrAMINE (BENADRYL) injection 25 mg (25 mg Intravenous Given 12/10/17 1829)      Initial Impression / Assessment and Plan / ED Course  I have reviewed the triage vital signs and the nursing notes.  Pertinent labs & imaging results that were available during my care of the patient were reviewed by me and considered in my medical decision making (see chart for details).    Patient hypertensive but now is developed also a migraine typical headache for her.  Patient treated with 1 L normal saline bolus Reglan IV Decadron IV and Benadryl IV and had complete resolution of the headache.  Patient also given an oral dose of Toprol her normal blood pressure medicine that she is been out of.  No significant change in her hypertension.  The patient will continue Toprol keep a logbook and follow-up with her primary care doctor may require adjustments.  Do feel that the headache was definitely migraine related.  Significant response with a migraine cocktail with complete resolution of the headache.  Patient without any neuro deficits.   Final Clinical Impressions(s) / ED Diagnoses   Final diagnoses:  Intractable migraine without aura and without status migrainosus  Essential hypertension    ED Discharge Orders         Ordered    metoprolol succinate (TOPROL-XL) 25 MG 24 hr tablet  Daily     12/10/17 1941           Fredia Sorrow, MD 12/10/17 2156

## 2017-12-10 NOTE — Discharge Instructions (Addendum)
I am glad that the migraine is resolved.  Start taking the blood pressure medicine on a daily basis.  Make an appointment to follow-up with your regular doctor sometime in the next week or 2 for recheck of blood pressure.  If you are able taking daily checks of your blood pressure and recording the date will make for a helpful log book for your physician to adjust your blood pressure medicine.  Return for any new or worse symptoms.
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
## 2017-12-16 ENCOUNTER — Ambulatory Visit (INDEPENDENT_AMBULATORY_CARE_PROVIDER_SITE_OTHER): Payer: Medicare Other | Admitting: Psychiatry

## 2017-12-16 VITALS — BP 148/110 | HR 78 | Ht 64.0 in | Wt 178.0 lb

## 2017-12-16 DIAGNOSIS — G4700 Insomnia, unspecified: Secondary | ICD-10-CM | POA: Diagnosis not present

## 2017-12-16 DIAGNOSIS — F410 Panic disorder [episodic paroxysmal anxiety] without agoraphobia: Secondary | ICD-10-CM | POA: Diagnosis not present

## 2017-12-16 DIAGNOSIS — F411 Generalized anxiety disorder: Secondary | ICD-10-CM

## 2017-12-16 MED ORDER — MIRTAZAPINE 45 MG PO TABS: 45.0000 mg | ORAL_TABLET | Freq: Every day | ORAL | 0 refills | Status: DC

## 2017-12-16 MED ORDER — ESCITALOPRAM OXALATE 20 MG PO TABS: 20.0000 mg | ORAL_TABLET | Freq: Every day | ORAL | 0 refills | Status: DC

## 2017-12-16 MED ORDER — TRAZODONE HCL 100 MG PO TABS: 100.0000 mg | ORAL_TABLET | Freq: Every evening | ORAL | 0 refills | Status: DC | PRN

## 2017-12-16 MED ORDER — GABAPENTIN 600 MG PO TABS: 600.0000 mg | ORAL_TABLET | Freq: Three times a day (TID) | ORAL | 0 refills | Status: DC

## 2017-12-16 NOTE — Patient Instructions (Signed)
1.continuemirtazapine 45mg  at night 2. Continue lexapro 20 mg daily 3.Increasegabapentin; 063-016-010 mg for 3 days, then 600-400-600 mg for 3 days, then 600 mg three times a day 4. Continue Trazodone 100 mg at night as needed for sleep 5. Return to clinic in three months for 15 mins

## 2017-12-30 ENCOUNTER — Ambulatory Visit (HOSPITAL_COMMUNITY): Payer: Self-pay | Admitting: Psychiatry

## 2018-01-15 ENCOUNTER — Ambulatory Visit (INDEPENDENT_AMBULATORY_CARE_PROVIDER_SITE_OTHER): Payer: Medicare Other | Admitting: Psychiatry

## 2018-01-15 DIAGNOSIS — F410 Panic disorder [episodic paroxysmal anxiety] without agoraphobia: Secondary | ICD-10-CM

## 2018-01-15 DIAGNOSIS — F411 Generalized anxiety disorder: Secondary | ICD-10-CM | POA: Diagnosis not present
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
## 2018-01-20 ENCOUNTER — Ambulatory Visit (HOSPITAL_COMMUNITY): Payer: Medicare Other | Admitting: Psychiatry

## 2018-02-02 ENCOUNTER — Encounter (HOSPITAL_COMMUNITY): Payer: Self-pay | Admitting: Psychiatry

## 2018-02-02 ENCOUNTER — Other Ambulatory Visit: Payer: Self-pay | Admitting: Nurse Practitioner

## 2018-02-02 NOTE — Telephone Encounter (Signed)
This encounter was created in error - please disregard.

## 2018-02-03 ENCOUNTER — Ambulatory Visit (INDEPENDENT_AMBULATORY_CARE_PROVIDER_SITE_OTHER): Payer: Medicare Other | Admitting: Psychiatry

## 2018-02-03 DIAGNOSIS — F4310 Post-traumatic stress disorder, unspecified: Secondary | ICD-10-CM | POA: Diagnosis not present

## 2018-02-03 DIAGNOSIS — F411 Generalized anxiety disorder: Secondary | ICD-10-CM

## 2018-02-03 DIAGNOSIS — F410 Panic disorder [episodic paroxysmal anxiety] without agoraphobia: Secondary | ICD-10-CM
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
## 2018-02-09 ENCOUNTER — Ambulatory Visit (HOSPITAL_COMMUNITY): Payer: Medicare Other | Admitting: Psychiatry
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
## 2018-02-11 ENCOUNTER — Ambulatory Visit (INDEPENDENT_AMBULATORY_CARE_PROVIDER_SITE_OTHER): Payer: Medicare Other | Admitting: Psychiatry

## 2018-02-11 ENCOUNTER — Encounter (HOSPITAL_COMMUNITY): Payer: Self-pay | Admitting: Psychiatry

## 2018-02-11 VITALS — BP 135/92 | HR 82 | Ht 64.0 in | Wt 178.0 lb

## 2018-02-11 DIAGNOSIS — F410 Panic disorder [episodic paroxysmal anxiety] without agoraphobia: Secondary | ICD-10-CM

## 2018-02-11 DIAGNOSIS — F411 Generalized anxiety disorder: Secondary | ICD-10-CM | POA: Diagnosis not present

## 2018-02-11 MED ORDER — GABAPENTIN 600 MG PO TABS: 600.0000 mg | ORAL_TABLET | Freq: Three times a day (TID) | ORAL | 0 refills | Status: DC

## 2018-02-11 MED ORDER — ESCITALOPRAM OXALATE 20 MG PO TABS: 20.0000 mg | ORAL_TABLET | Freq: Every day | ORAL | 0 refills | Status: DC

## 2018-02-11 MED ORDER — MIRTAZAPINE 45 MG PO TABS: 45.0000 mg | ORAL_TABLET | Freq: Every day | ORAL | 0 refills | Status: DC

## 2018-02-11 MED ORDER — TRAZODONE HCL 150 MG PO TABS: 150.0000 mg | ORAL_TABLET | Freq: Every evening | ORAL | 0 refills | Status: DC | PRN

## 2018-02-11 NOTE — Patient Instructions (Addendum)
1.Continuemirtazapine 45mg  at night 2. Continue lexapro 20 mg daily 3.Continue gabapentin 600 mg three times a day 4.IncreaseTrazodone 150 mg at night as needed for sleep 5. Return to clinic in three months for 15 mins

## 2018-03-02 DIAGNOSIS — Z0289 Encounter for other administrative examinations: Secondary | ICD-10-CM
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
## 2018-03-16 ENCOUNTER — Ambulatory Visit (HOSPITAL_COMMUNITY): Payer: Medicare Other | Admitting: Psychiatry

## 2018-03-23 ENCOUNTER — Ambulatory Visit (HOSPITAL_COMMUNITY): Payer: Medicare Other | Admitting: Psychiatry

## 2018-04-28 ENCOUNTER — Encounter: Payer: Self-pay | Admitting: Internal Medicine

## 2018-05-11 ENCOUNTER — Emergency Department (HOSPITAL_COMMUNITY)
Admission: EM | Admit: 2018-05-11 | Discharge: 2018-05-11 | Disposition: A | Discharge status: Home / Self Care | Payer: Medicare Other | Attending: Emergency Medicine | Admitting: Emergency Medicine

## 2018-05-11 ENCOUNTER — Encounter (HOSPITAL_COMMUNITY): Payer: Self-pay | Admitting: Emergency Medicine

## 2018-05-11 ENCOUNTER — Emergency Department (HOSPITAL_COMMUNITY): Payer: Medicare Other

## 2018-05-11 ENCOUNTER — Other Ambulatory Visit: Payer: Self-pay

## 2018-05-11 DIAGNOSIS — S60111A Contusion of right thumb with damage to nail, initial encounter: Secondary | ICD-10-CM | POA: Diagnosis not present

## 2018-05-11 DIAGNOSIS — I10 Essential (primary) hypertension: Secondary | ICD-10-CM | POA: Diagnosis not present

## 2018-05-11 DIAGNOSIS — Z79899 Other long term (current) drug therapy: Secondary | ICD-10-CM | POA: Insufficient documentation

## 2018-05-11 DIAGNOSIS — S6000XA Contusion of unspecified finger without damage to nail, initial encounter: Secondary | ICD-10-CM

## 2018-05-11 DIAGNOSIS — Y9289 Other specified places as the place of occurrence of the external cause: Secondary | ICD-10-CM | POA: Diagnosis not present

## 2018-05-11 DIAGNOSIS — Y999 Unspecified external cause status: Secondary | ICD-10-CM | POA: Insufficient documentation

## 2018-05-11 DIAGNOSIS — W230XXA Caught, crushed, jammed, or pinched between moving objects, initial encounter: Secondary | ICD-10-CM | POA: Diagnosis not present

## 2018-05-11 DIAGNOSIS — S6010XA Contusion of unspecified finger with damage to nail, initial encounter: Secondary | ICD-10-CM

## 2018-05-11 DIAGNOSIS — Y9389 Activity, other specified: Secondary | ICD-10-CM | POA: Diagnosis not present

## 2018-05-11 DIAGNOSIS — J45909 Unspecified asthma, uncomplicated: Secondary | ICD-10-CM | POA: Diagnosis not present

## 2018-05-11 DIAGNOSIS — S6701XA Crushing injury of right thumb, initial encounter: Secondary | ICD-10-CM | POA: Diagnosis present

## 2018-05-11 MED ORDER — HYDROGEN PEROXIDE 3 % EX SOLN
CUTANEOUS | Status: AC
  Administered 2018-05-11: 17:00:00
  Filled 2018-05-11: qty 473

## 2018-05-11 MED ORDER — HYDROCODONE-ACETAMINOPHEN 5-325 MG PO TABS: 1.0000 | ORAL_TABLET | ORAL | 0 refills | Status: DC | PRN

## 2018-05-11 MED ORDER — ACETAMINOPHEN 500 MG PO TABS
1000.0000 mg | ORAL_TABLET | Freq: Once | ORAL | Status: AC
  Administered 2018-05-11: 1000 mg via ORAL
  Filled 2018-05-11: qty 2

## 2018-05-11 MED ORDER — IBUPROFEN 800 MG PO TABS
800.0000 mg | ORAL_TABLET | Freq: Once | ORAL | Status: AC
  Administered 2018-05-11: 800 mg via ORAL
  Filled 2018-05-11: qty 1

## 2018-05-11 NOTE — ED Provider Notes (Signed)
[facility] EMERGENCY DEPARTMENT Provider Note   CSN: [id number] Arrival date & time: 05/11/18  1156    History   Chief Complaint Chief Complaint  Patient presents with  . Finger Injury    HPI Claire [name] is a 48 y.o. female.     Patient is a 48 year old female who presents to the emergency department with complaint of pain of the right thumb.  The patient states this problem started on February 22.  She states she mastered thumb in a car door.  She says from Saturday, February 22 up until now she has been having increasing throbbing pain involving the right thumb.  She presents now for assistance with this issue.  The patient denies being on any anticoagulation medications.  And she has not had any operations or procedures involving the right hand.  The patient denies any other injury at this time.  The history is provided by the patient.    Past Medical History:  Diagnosis Date  . Acid reflux   . Allergy    pollen, dust  . Anxiety   . Arthritis   . Asthma   . Carpal tunnel syndrome    bilateral  . DDD (degenerative disc disease), cervical   . Depression   . Fibromyalgia   . Hypertension   . Migraine   . Palpitations   . Panic attacks   . PTSD (post-traumatic stress disorder)   . Seizures ([place])    unknown etiology; last seizure was 2 years ago; on meds.  . Syncope and collapse   . Tennis elbow   . Ulcer   . Vertigo     Patient Active Problem List   Diagnosis Date Noted  . Eosinophilic esophagitis 82/42/3536  . H. pylori infection 12/02/2016  . Dysphagia 07/03/2016  . Constipation 07/03/2016  . Abdominal pain 07/03/2016  . Generalized anxiety disorder 06/17/2016  . Panic disorder 06/17/2016  . Asthma in adult, mild intermittent, uncomplicated 14/43/1540  . GERD without esophagitis 06/11/2016  . 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 Frequent falls 06/11/2016  . Vertigo 06/11/2016  . History of syncope 06/11/2016  . Fibromyalgia 07/28/2014    Past  Surgical History:  Procedure Laterality Date  . BACK SURGERY  1993  . BIOPSY  10/21/2016   Procedure: BIOPSY;  Surgeon: Daneil Dolin, MD;  Location: [facility];  Service: Endoscopy;;  gastric, esophagus  . CHOLECYSTECTOMY    . ESOPHAGOGASTRODUODENOSCOPY (EGD) WITH PROPOFOL N/A 10/21/2016   Procedure: ESOPHAGOGASTRODUODENOSCOPY (EGD) WITH PROPOFOL;  Surgeon: Daneil Dolin, MD;  Location: [facility];  Service: Endoscopy;  Laterality: N/A;  9:30am  . GALLBLADDER SURGERY    . SHOULDER SURGERY Right   . SPINE SURGERY  1993   lumbar disc surgery     OB History   No obstetric history on file.      Home Medications    Prior to Admission medications   Medication Sig Start Date End Date Taking? Authorizing Provider  albuterol (PROVENTIL HFA;VENTOLIN HFA) 108 (90 Base) MCG/ACT inhaler Inhale 1-2 puffs into the lungs every 6 (six) hours as needed for wheezing or shortness of breath.     [provider]  carboxymethylcellulose (REFRESH PLUS) 0.5 % SOLN Place 1 drop into both eyes 2 (two) times daily.    [provider]  diphenhydrAMINE (BENADRYL) 25 MG tablet Take 50 mg by mouth 2 (two) times daily.    [provider]  escitalopram (LEXAPRO) 20 MG tablet Take 1 tablet (20 mg total)  by mouth daily. 03/13/18   Norman Clay, MD  gabapentin (NEURONTIN) 600 MG tablet Take 1 tablet (600 mg total) by mouth 3 (three) times daily. 03/13/18   Norman Clay, MD  HYDROcodone-acetaminophen (NORCO) 10-325 MG tablet Take 1 tablet by mouth every 6 (six) hours as needed for moderate pain.  05/16/17   [provider]  ibuprofen (ADVIL,MOTRIN) 600 MG tablet Take 1 tablet (600 mg total) by mouth every 6 (six) hours as needed. 05/23/17   Evalee Jefferson, PA-C  LINZESS 72 MCG capsule TAKE 1 CAPSULE BY MOUTH ONCE DAILY BEFORE  BREAKFAST 02/05/18   Carlis Stable, NP  metoprolol succinate (TOPROL-XL) 25 MG 24 hr tablet Take 1 tablet (25 mg total) by mouth daily. 09/13/16   Raylene Everts, MD  metoprolol succinate (TOPROL-XL) 25 MG 24 hr tablet Take 1 tablet (25 mg total) by mouth daily. 12/10/17   Fredia Sorrow, MD  mirtazapine (REMERON) 45 MG tablet Take 1 tablet (45 mg total) by mouth at bedtime. 03/13/18   Norman Clay, MD  Multiple Vitamin (MULTIVITAMIN WITH MINERALS) TABS tablet Take 2 tablets by mouth daily.     [provider]  omeprazole (PRILOSEC) 40 MG capsule Take 1 capsule (40 mg total) by mouth daily. Patient not taking: Reported on 02/11/2018 10/07/16   Raylene Everts, MD  Potassium 99 MG TABS Take 198 mg by mouth daily.     [provider]  tiZANidine (ZANAFLEX) 2 MG tablet Take 2 mg by mouth 3 (three) times daily. 09/30/16   [provider]  traZODone (DESYREL) 150 MG tablet Take 1 tablet (150 mg total) by mouth at bedtime as needed for sleep. 02/11/18   Norman Clay, MD    Family History Family History  Problem Relation Age of Onset  . COPD Mother   . Bronchitis Mother   . Arthritis Mother   . Depression Mother   . Heart disease Mother   . Hypertension Mother   . Colon cancer Maternal Grandmother   . Alcohol abuse Father   . Early death Father        GSW  . PKU Son     Social History Social History   Tobacco Use  . Smoking status: Never Smoker  . Smokeless tobacco: Never Used  Substance Use Topics  . Alcohol use: Yes    Comment: drink occasionally  . Drug use: No     Allergies   Baclofen; Claritin [loratadine]; Cymbalta [duloxetine hcl]; Penicillins; Sulfa antibiotics; and Ultram [tramadol]   Review of Systems Review of Systems  Constitutional: Negative for activity change.       All ROS Neg except as noted in HPI  HENT: Negative for nosebleeds.   Eyes: Negative for photophobia and discharge.  Respiratory: Negative for cough, shortness of breath and wheezing.   Cardiovascular: Negative for chest pain and palpitations.  Gastrointestinal: Negative for abdominal pain and blood in stool.    Genitourinary: Negative for dysuria, frequency and hematuria.  Musculoskeletal: Negative for arthralgias, back pain and neck pain.       Finger pain  Skin: Negative.   Neurological: Negative for dizziness, seizures and speech difficulty.  Psychiatric/Behavioral: Negative for confusion and hallucinations.     Physical Exam Updated Vital Signs BP (!) 119/92 (BP Location: Right Arm)   Pulse 83   Temp 98 F (36.7 C) (Oral)   Resp 16   Ht 5\' 4"  (1.626 m)   Wt 73.5 kg   LMP 12/09/2017   SpO2  100%   BMI 27.81 kg/m   Physical Exam Vitals signs and nursing note reviewed.  Constitutional:      Appearance: She is well-developed. She is not toxic-appearing.  HENT:     Head: Normocephalic.     Right Ear: Tympanic membrane and external ear normal.     Left Ear: Tympanic membrane and external ear normal.  Eyes:     General: Lids are normal.     Pupils: Pupils are equal, round, and reactive to light.  Neck:     Musculoskeletal: Normal range of motion and neck supple.     Vascular: No carotid bruit.  Cardiovascular:     Rate and Rhythm: Normal rate and regular rhythm.     Pulses: Normal pulses.     Heart sounds: Normal heart sounds.  Pulmonary:     Effort: No respiratory distress.     Breath sounds: Normal breath sounds.  Abdominal:     General: Bowel sounds are normal.     Palpations: Abdomen is soft.     Tenderness: There is no abdominal tenderness. There is no guarding.  Musculoskeletal:     Right hand: She exhibits decreased range of motion, tenderness and swelling.       Hands:     Comments: There is swelling of the tip of the thumb of the right hand.  There is subungual hematoma noted.  There is good range of motion of the MP joint.  Patient is too uncomfortable to attempt the IP joint.  Lymphadenopathy:     Head:     Right side of head: No submandibular adenopathy.     Left side of head: No submandibular adenopathy.     Cervical: No cervical adenopathy.  Skin:     General: Skin is warm and dry.  Neurological:     Mental Status: She is alert and oriented to person, place, and time.     Cranial Nerves: No cranial nerve deficit.     Sensory: No sensory deficit.  Psychiatric:        Speech: Speech normal.      ED Treatments / Results  Labs (all labs ordered are listed, but only abnormal results are displayed) Labs Reviewed - No data to display  EKG None  Radiology Dg Finger Thumb Right  Result Date: 05/11/2018 CLINICAL DATA:  Thumb pain after slamming in car door. EXAM: RIGHT THUMB 2+V COMPARISON:  None. FINDINGS: There is no evidence of fracture or dislocation. There is no evidence of arthropathy or other focal bone abnormality. Soft tissues are unremarkable IMPRESSION: Negative. Electronically Signed   By: Titus Dubin M.D.   On: 05/11/2018 12:22    Procedures .Nail Removal Date/Time: 05/11/2018 4:24 PM Performed by: Lily Kocher, PA-C Authorized by: Lily Kocher, PA-C   Consent:    Consent obtained:  Verbal   Consent given by:  Patient   Risks discussed:  Bleeding, infection, pain and permanent nail deformity Location:    Hand:  R thumb Pre-procedure details:    Skin preparation:  Alcohol   Preparation: Patient was prepped and draped in the usual sterile fashion   Anesthesia (see MAR for exact dosages):    Anesthesia method:  None Trephination:    Subungual hematoma drained: yes     Trephination instrument:  Needle Post-procedure details:    Dressing: sterile bandage.   Patient tolerance of procedure:  Tolerated well, no immediate complications   (including critical care time)  Medications Ordered in ED Medications - No data  to display   Initial Impression / Assessment and Plan / ED Course  I have reviewed the triage vital signs and the nursing notes.  Pertinent labs & imaging results that were available during my care of the patient were reviewed by me and considered in my medical decision making (see chart for  details).          Final Clinical Impressions(s) / ED Diagnoses MDM  Vital signs reviewed.  Patient smashed the right thumb in a car door 2 to 3 days ago.  The patient noted increasing swelling.  She was seen at an urgent care tried to drain a subungual hematoma without success, and she came to the emergency department for additional evaluation and management.  The subungual hematoma was drained.  X-ray is negative for fracture or dislocation.  I have asked the patient to keep the hand elevated as much as possible to help improve the pain.  The patient will use Tylenol, and/or ibuprofen for soreness.  Prescription for 10 tablets of Norco given to the patient to to use at bedtime.  Patient is in agreement with this plan.   Final diagnoses:  Subungual hematoma of digit of hand, initial encounter  Contusion of finger of right hand, unspecified finger, initial encounter    ED Discharge Orders         Ordered    HYDROcodone-acetaminophen (NORCO/VICODIN) 5-325 MG tablet  Every 4 hours PRN     05/11/18 1640           Lily Kocher, PA-C 05/12/18 1836    Milton Ferguson, MD 05/13/18 [phone]

## 2018-05-11 NOTE — Discharge Instructions (Signed)
Please keep your thumb elevated above your heart is much as possible.  Use Tylenol every 4 hours or ibuprofen every 6 hours for mild pain.  Use Norco at bedtime if needed for pain.  Please see your primary physician for additional evaluation if not improving.  Your x-ray is negative for fracture or dislocation.

## 2018-05-11 NOTE — ED Triage Notes (Addendum)
Patient sent from [facility] for complaint of "smashing my right thumb in the car door on Saturday."

## 2018-05-21 ENCOUNTER — Telehealth: Payer: Self-pay | Admitting: Internal Medicine

## 2018-05-21 ENCOUNTER — Ambulatory Visit (HOSPITAL_COMMUNITY): Payer: Medicare Other | Admitting: Psychiatry

## 2018-05-21 DIAGNOSIS — K219 Gastro-esophageal reflux disease without esophagitis: Secondary | ICD-10-CM

## 2018-05-21 NOTE — Telephone Encounter (Signed)
Pt has OV in MAY for her one year follow up. She was last seen in March 2019. She is asking for a prescription or samples for heartburn to hold her until her appointment. She uses [facility]. Please advise [phone]

## 2018-05-21 NOTE — Telephone Encounter (Signed)
Spoke with pt. She is out of refills. The Prilosec 40 mg works well and she wanted to know if she could have a refill on her meds or a sample until her apt.

## 2018-05-21 NOTE — Telephone Encounter (Signed)
Lmom, waiting on a return call. Need to confirm medication pt has been taking.

## 2018-05-25 NOTE — Telephone Encounter (Signed)
Pt called back to asked about the Refill on the Prilosec 40 mg. Her appointment is 07/22/18. Please advise.

## 2018-05-28 MED ORDER — OMEPRAZOLE 40 MG PO CPDR: 40.0000 mg | DELAYED_RELEASE_CAPSULE | Freq: Every day | ORAL | 1 refills | Status: DC

## 2018-05-28 NOTE — Telephone Encounter (Signed)
Pt notified that RX was sent to her pharmacy.

## 2018-05-28 NOTE — Addendum Note (Signed)
Addended by: Gordy Levan, Korrina Zern A on: 05/28/2018 03:03 PM   Modules accepted: Orders

## 2018-05-28 NOTE — Telephone Encounter (Signed)
Rx sent to pharmacy   

## 2018-06-01 ENCOUNTER — Ambulatory Visit (HOSPITAL_COMMUNITY): Payer: Medicare Other | Admitting: Psychiatry
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
## 2018-06-08 ENCOUNTER — Other Ambulatory Visit: Payer: Self-pay

## 2018-06-08 ENCOUNTER — Ambulatory Visit (INDEPENDENT_AMBULATORY_CARE_PROVIDER_SITE_OTHER): Payer: Medicare Other | Admitting: Psychiatry

## 2018-06-08 ENCOUNTER — Encounter (HOSPITAL_COMMUNITY): Payer: Self-pay | Admitting: Psychiatry

## 2018-06-08 VITALS — BP 125/88 | HR 97 | Ht 64.0 in | Wt 158.0 lb

## 2018-06-08 DIAGNOSIS — F410 Panic disorder [episodic paroxysmal anxiety] without agoraphobia: Secondary | ICD-10-CM

## 2018-06-08 DIAGNOSIS — Z811 Family history of alcohol abuse and dependence: Secondary | ICD-10-CM

## 2018-06-08 DIAGNOSIS — Z818 Family history of other mental and behavioral disorders: Secondary | ICD-10-CM

## 2018-06-08 DIAGNOSIS — G4709 Other insomnia: Secondary | ICD-10-CM

## 2018-06-08 DIAGNOSIS — R450 Nervousness: Secondary | ICD-10-CM | POA: Diagnosis not present

## 2018-06-08 MED ORDER — ESCITALOPRAM OXALATE 20 MG PO TABS: 20.0000 mg | ORAL_TABLET | Freq: Every day | ORAL | 0 refills | Status: DC

## 2018-06-08 MED ORDER — GABAPENTIN 600 MG PO TABS: 600.0000 mg | ORAL_TABLET | Freq: Three times a day (TID) | ORAL | 0 refills | Status: DC

## 2018-06-08 MED ORDER — MIRTAZAPINE 45 MG PO TABS: 45.0000 mg | ORAL_TABLET | Freq: Every day | ORAL | 0 refills | Status: DC

## 2018-06-08 MED ORDER — QUETIAPINE FUMARATE 25 MG PO TABS: 25.0000 mg | ORAL_TABLET | Freq: Every day | ORAL | 0 refills | Status: DC

## 2018-06-08 NOTE — Patient Instructions (Signed)
1.Continuemirtazapine 45mg  at night 2. Continue lexapro 20 mg daily 3. Start quetiapine 25 mg at night  4. Discontinue Trazodone  5  Continue gabapentin 600 mg three times a day 6. Return to clinic in three months for 15 mins

## 2018-06-25 ENCOUNTER — Ambulatory Visit (HOSPITAL_COMMUNITY): Payer: Medicare Other | Admitting: Psychiatry

## 2018-07-22 ENCOUNTER — Ambulatory Visit (INDEPENDENT_AMBULATORY_CARE_PROVIDER_SITE_OTHER): Payer: Medicare Other | Admitting: Nurse Practitioner

## 2018-07-22 ENCOUNTER — Encounter: Payer: Self-pay | Admitting: Nurse Practitioner

## 2018-07-22 ENCOUNTER — Other Ambulatory Visit: Payer: Self-pay

## 2018-07-22 DIAGNOSIS — K5900 Constipation, unspecified: Secondary | ICD-10-CM | POA: Diagnosis not present

## 2018-07-22 DIAGNOSIS — K219 Gastro-esophageal reflux disease without esophagitis: Secondary | ICD-10-CM

## 2018-07-22 NOTE — Assessment & Plan Note (Signed)
GERD doing well on Prilosec.  Recommend she continue her current medication and follow-up in 2 months.

## 2018-07-22 NOTE — Assessment & Plan Note (Signed)
The patient describes some worsening constipation as well as associated abdominal pain.  Given the high stress situation with the COVID-19 pandemic, this is not unreasonable.  Linzess 72 mcg is previously worked well for her.  I asked asked her to increase her Linzess to 145 mcg daily and call with a progress report in 5 to 7 days.  She will simply take 2 of her 72 mcg doses.  If this is more effective we can send in a 3-month supply for her.  Continue other medications otherwise, follow-up in 2 months.  Call if any worsening symptoms or problems.

## 2018-07-22 NOTE — Progress Notes (Signed)
Referring Provider: Wyatt Haste, NP Primary Care Physician:  Wyatt Haste, NP Primary GI:  Dr. Gala Romney  NOTE: Service was provided via telemedicine and was requested by the patient due to COVID-19 pandemic.  Method of visit: Telephone  Patient Location: Home  Provider Location: Office  Reason for Phone Visit: Follow-up  The patient was consented to phone follow-up via telephone encounter including billing of the encounter (yes/no): Yes  Persons present on the phone encounter, with roles: None  Total time (minutes) spent on medical discussion: 24 minutes  Chief Complaint  Patient presents with  . Follow-up    F/U G[place],Abd pain,Constipation    HPI:   Claire [name] is a 48 y.o. female who presents for virtual visit regarding: Follow-up on GERD, abdominal pain, constipation.  The patient was last seen in our office 06/02/2017 for GERD, H. pylori infection, constipation.  EGD on file 10/21/2016 with grade paper appearing esophageal mucosa with longitudinal burrows, no tumor or Barrett's esophagus, antral erosions but no ulcers, 2 superficial tears in the midesophagus corresponding to an area of critical narrowing.  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  Stomach biopsies not H. pylori treated with Pylera and esophageal biopsy with squamous mucosa with marked increase in epithelial eosinophils.  She completed her H. pylori treatment but did not complete H. pylori breath test for eradication because she cannot tolerate being off PPI.  GERD doing well on PPI, constipation doing well on Linzess.  No other GI complaints.  Recommended continue current medications, attempt H. pylori breath testing, follow-up in 1 year.  It does not appear that she completed her H. pylori breath testing for eradication.  Today she states she's doing ok overall. GERD is doing well, no breakthrough symptoms (Prilosec 40mg  daily). Constipation "getting worse by the day". Linzess previously worked well for her at 72 mcg  daily. Currently having a bowel movement about once a week, hard stools, straining. Associated lower crampy abdominal pain that improved with a bowel movement. Denies other abdominal pain, N/V, hematochezia, melena (although she does have darker than normal stools), fever, chills, unintentional weight loss. Denies URI and flu-like symptoms. Denies chest pain, dyspnea, dizziness, lightheadedness, syncope, near syncope. Denies any other upper or lower GI symptoms.  Past Medical History:  Diagnosis Date  . Acid reflux   . Allergy    pollen, dust  . Anxiety   . Arthritis   . Asthma   . Carpal tunnel syndrome    bilateral  . DDD (degenerative disc disease), cervical   . Depression   . Fibromyalgia   . Hypertension   . Migraine   . Palpitations   . Panic attacks   . PTSD (post-traumatic stress disorder)   . Seizures ([place])    unknown etiology; last seizure was 2 years ago; on meds.  . Syncope and collapse   . Tennis elbow   . Ulcer   . Vertigo     Past Surgical History:  Procedure Laterality Date  . BACK SURGERY  1993  . BIOPSY  10/21/2016   Procedure: BIOPSY;  Surgeon: Daneil Dolin, MD;  Location: [facility];  Service: Endoscopy;;  gastric, esophagus  . CHOLECYSTECTOMY    . ESOPHAGOGASTRODUODENOSCOPY (EGD) WITH PROPOFOL N/A 10/21/2016   Procedure: ESOPHAGOGASTRODUODENOSCOPY (EGD) WITH PROPOFOL;  Surgeon: Daneil Dolin, MD;  Location: [facility];  Service: Endoscopy;  Laterality: N/A;  9:30am  . GALLBLADDER SURGERY    . SHOULDER SURGERY Right   . SPINE SURGERY  1993   lumbar disc surgery    Current Outpatient Medications  Medication Sig Dispense Refill  . albuterol (PROVENTIL HFA;VENTOLIN HFA) 108 (90 Base) MCG/ACT inhaler Inhale 1-2 puffs into the lungs every 6 (six) hours as needed for wheezing or shortness of breath.     . carboxymethylcellulose (REFRESH PLUS) 0.5 % SOLN Place 1 drop into both eyes 2 (two) times daily.    . diphenhydrAMINE (BENADRYL) 25 MG tablet Take  50 mg by mouth 2 (two) times daily.    Marland Kitchen escitalopram (LEXAPRO) 20 MG tablet Take 1 tablet (20 mg total) by mouth daily. 90 tablet 0  . gabapentin (NEURONTIN) 600 MG tablet Take 1 tablet (600 mg total) by mouth 3 (three) times daily. 270 tablet 0  . HYDROcodone-acetaminophen (NORCO/VICODIN) 5-325 MG tablet Take 1 tablet by mouth every 4 (four) hours as needed. (Patient taking differently: Take 1 tablet by mouth every 4 (four) hours as needed. Takes 10-325 mg) 10 tablet 0  . ibuprofen (ADVIL,MOTRIN) 600 MG tablet Take 1 tablet (600 mg total) by mouth every 6 (six) hours as needed. 30 tablet 0  . LINZESS 72 MCG capsule TAKE 1 CAPSULE BY MOUTH ONCE DAILY BEFORE  BREAKFAST 90 capsule 2  . metoprolol succinate (TOPROL-XL) 25 MG 24 hr tablet Take 1 tablet (25 mg total) by mouth daily. 90 tablet 3  . mirtazapine (REMERON) 45 MG tablet Take 1 tablet (45 mg total) by mouth at bedtime. 90 tablet 0  . Multiple Vitamin (MULTIVITAMIN WITH MINERALS) TABS tablet Take 2 tablets by mouth daily.     Marland Kitchen omeprazole (PRILOSEC) 40 MG capsule Take 1 capsule (40 mg total) by mouth daily. 90 capsule 1  . Potassium 99 MG TABS Take 198 mg by mouth daily.     . QUEtiapine (SEROQUEL) 25 MG tablet Take 1 tablet (25 mg total) by mouth at bedtime. 90 tablet 0  . tiZANidine (ZANAFLEX) 2 MG tablet Take 2 mg by mouth 3 (three) times daily.     No current facility-administered medications for this visit.     Allergies as of 07/22/2018 - Review Complete 07/22/2018  Allergen Reaction Noted  . Baclofen Shortness Of Breath 10/10/2016  . Claritin [loratadine] Other (See Comments) 07/28/2014  . Cymbalta [duloxetine hcl] Hives 07/29/2012  . Penicillins Hives 07/29/2012  . Sulfa antibiotics Hives 07/29/2012  . Ultram [tramadol] Other (See Comments) 09/12/2014    Family History  Problem Relation Age of Onset  . COPD Mother   . Bronchitis Mother   . Arthritis Mother   . Depression Mother   . Heart disease Mother   . Hypertension  Mother   . Colon cancer Maternal Grandmother   . Alcohol abuse Father   . Early death Father        GSW  . PKU Son     Social History   Socioeconomic History  . Marital status: Single    Spouse name: Not on file  . Number of children: 1  . Years of education: HS  . Highest education level: Not on file  Occupational History  . Occupation: disability    Comment: unemployed  Social Needs  . Financial resource strain: Not on file  . Food insecurity:    Worry: Not on file    Inability: Not on file  . Transportation needs:    Medical: Not on file    Non-medical: Not on file  Tobacco Use  . Smoking status: Never Smoker  . Smokeless tobacco: Never Used  Substance  and Sexual Activity  . Alcohol use: Not Currently    Comment: drink occasionally  . Drug use: No  . Sexual activity: Not Currently    Partners: Female    Birth control/protection: None  Lifestyle  . Physical activity:    Days per week: Not on file    Minutes per session: Not on file  . Stress: Not on file  Relationships  . Social connections:    Talks on phone: Not on file    Gets together: Not on file    Attends religious service: Not on file    Active member of club or organization: Not on file    Attends meetings of clubs or organizations: Not on file    Relationship status: Not on file  Other Topics Concern  . Not on file  Social History Narrative   Patient is left handed.   Patient drinks very little caffeine.   Lives alone    Review of Systems: General: Negative for anorexia, weight loss, fever, chills, fatigue, weakness. ENT: Negative for hoarseness, difficulty swallowing. CV: Negative for chest pain, angina, palpitations, peripheral edema.  Respiratory: Negative for dyspnea at rest, cough, sputum, wheezing.  GI: See history of present illness. Endo: Negative for unusual weight change.  Heme: Negative for bruising or bleeding. Allergy: Negative for rash or hives.  Physical Exam: Note:  limited exam due to virtual visit General:   Alert and oriented. Pleasant and cooperative. Ears:  Normal auditory acuity. Skin:  Intact without facial significant lesions or rashes. Neurologic:  Alert and oriented x4 Psych:  Alert and cooperative. Normal mood and affect.

## 2018-07-22 NOTE — Patient Instructions (Signed)
Your health issues we discussed today were:   GERD (reflux/heartburn): 1. I am glad your medication is working for you 2. Continue taking your current medication and let us know if your symptoms come back  Constipation with abdominal pain: 1. I am not completely surprised that your constipation is getting worse, given the high stress situation going on right now 2. Linzess previously worked well for you and so I do not want to change medications unless we have to 3. As we discussed, start taking two of your 72 mcg Linzess pills, for sing in the morning, on an empty stomach 4. Call us in 5 to 7 days let us know if this is helping your constipation and abdominal pain  Overall I recommend:  1. Continue your other medications 2. Call us if you have any questions or concerns 3. Follow-up in 2 months.   Because of recent events of COVID-19 ("Coronavirus"), follow CDC recommendations:  1. Wash your hand frequently 2. Avoid touching your face 3. Stay away from people who are sick 4. If you have symptoms such as fever, cough, shortness of breath then call your healthcare provider for further guidance 5. If you are sick, STAY AT HOME unless otherwise directed by your healthcare provider. 6. Follow directions from state and national officials regarding staying safe   At [facility] we value your feedback. You may receive a survey about your visit today. Please share your experience as we strive to create trusting relationships with our patients to provide genuine, compassionate, quality care.  We appreciate your understanding and patience as we review any laboratory studies, imaging, and other diagnostic tests that are ordered as we care for you. Our office policy is 5 business days for review of these results, and any emergent or urgent results are addressed in a timely manner for your best interest. If you do not hear from our office in 1 week, please contact us.   We also  encourage the use of MyChart, which contains your medical information for your review as well. If you are not enrolled in this feature, an access code is on this after visit summary for your convenience. Thank you for allowing [place] to be involved in your care.  It was great to see you today!  I hope you have a great day!!

## 2018-07-23 ENCOUNTER — Encounter: Payer: Self-pay | Admitting: Internal Medicine

## 2018-07-23 NOTE — Progress Notes (Signed)
CC'D TO PCP °

## 2018-08-05 ENCOUNTER — Telehealth: Payer: Self-pay | Admitting: Internal Medicine

## 2018-08-05 NOTE — Telephone Encounter (Signed)
Patient called with an update since her last visit and she stated that she can not tell a difference with the increased linzess, please call

## 2018-08-05 NOTE — Telephone Encounter (Signed)
Spoke with pt. She will pick sample up of Linzess tomorrow. She will call with an update.

## 2018-08-05 NOTE — Telephone Encounter (Signed)
Patient currently has Rx for Linzess 72 mcg (which was not effective). Has been taking 2 x 72 mcg (to equal 145 mcg) which hasn't helped much more. I will have her pick up samples of 290 mcg to try and request call back in 3-4 days. May need to change to another option if this doesn't work.  Provider note: Patient on pain medications and may want to consider OIC-targeted therapy (Amitiza 24 mcg; Movantik, Symproic) or others.

## 2018-08-05 NOTE — Telephone Encounter (Signed)
Pt is having a BM once weekly. She is currently taking Linzess 72 mcg(2-145 mcg) daily. Pt isn't taking anything else to help her bowels move. Pt gets stomach cramping and pain when she doesn't have a bowel movement. Pt is straining when she has a bowel movement. Pt gets the sensation that she has to have a bowel movement and it doesn't come.

## 2018-08-13 ENCOUNTER — Ambulatory Visit (HOSPITAL_COMMUNITY): Payer: Medicare Other | Admitting: Psychiatry

## 2018-08-13 ENCOUNTER — Telehealth: Payer: Self-pay | Admitting: Internal Medicine

## 2018-08-13 MED ORDER — LINACLOTIDE 290 MCG PO CAPS: 290.0000 ug | ORAL_CAPSULE | Freq: Every day | ORAL | 3 refills | Status: DC

## 2018-08-13 NOTE — Addendum Note (Signed)
Addended by: Mahala Menghini on: 08/13/2018 04:14 PM   Modules accepted: Orders

## 2018-08-13 NOTE — Telephone Encounter (Signed)
rx sent

## 2018-08-13 NOTE — Telephone Encounter (Signed)
Pt called with an update of Linzess 290 mcg. Medication is working well and she would like Rx sent to her pharmacy. Please answer in the absence of EG.

## 2018-08-13 NOTE — Telephone Encounter (Signed)
[phone] patient called and said the linzess samples worked and would like a prescription sent to her pharmacy  [facility] in [place]

## 2018-08-17 ENCOUNTER — Other Ambulatory Visit: Payer: Self-pay | Admitting: Physical Medicine and Rehabilitation

## 2018-08-17 DIAGNOSIS — M47817 Spondylosis without myelopathy or radiculopathy, lumbosacral region: Secondary | ICD-10-CM

## 2018-08-17 NOTE — Telephone Encounter (Signed)
Noted  

## 2018-08-31 ENCOUNTER — Ambulatory Visit (INDEPENDENT_AMBULATORY_CARE_PROVIDER_SITE_OTHER): Payer: Medicare Other | Admitting: Psychiatry

## 2018-08-31 ENCOUNTER — Encounter (HOSPITAL_COMMUNITY): Payer: Self-pay | Admitting: Psychiatry

## 2018-08-31 ENCOUNTER — Other Ambulatory Visit: Payer: Self-pay

## 2018-08-31 DIAGNOSIS — F411 Generalized anxiety disorder: Secondary | ICD-10-CM | POA: Diagnosis not present
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
## 2018-09-03 ENCOUNTER — Ambulatory Visit
Admission: RE | Admit: 2018-09-03 | Discharge: 2018-09-03 | Disposition: A | Discharge status: Home / Self Care | Payer: Medicare Other | Source: Ambulatory Visit | Attending: Physical Medicine and Rehabilitation | Admitting: Physical Medicine and Rehabilitation

## 2018-09-03 DIAGNOSIS — M47817 Spondylosis without myelopathy or radiculopathy, lumbosacral region: Secondary | ICD-10-CM

## 2018-09-03 MED ORDER — GADOBENATE DIMEGLUMINE 529 MG/ML IV SOLN
14.0000 mL | Freq: Once | INTRAVENOUS | Status: AC | PRN
  Administered 2018-09-03: 14 mL via INTRAVENOUS

## 2018-09-07 ENCOUNTER — Other Ambulatory Visit (HOSPITAL_COMMUNITY): Payer: Self-pay | Admitting: Psychiatry

## 2018-09-07 MED ORDER — QUETIAPINE FUMARATE 25 MG PO TABS: 25.0000 mg | ORAL_TABLET | Freq: Every day | ORAL | 0 refills | Status: DC

## 2018-09-08 ENCOUNTER — Encounter (HOSPITAL_COMMUNITY): Payer: Self-pay | Admitting: Psychiatry

## 2018-09-08 ENCOUNTER — Other Ambulatory Visit: Payer: Self-pay

## 2018-09-08 ENCOUNTER — Ambulatory Visit (INDEPENDENT_AMBULATORY_CARE_PROVIDER_SITE_OTHER): Payer: Medicare Other | Admitting: Psychiatry

## 2018-09-08 ENCOUNTER — Telehealth (HOSPITAL_COMMUNITY): Payer: Self-pay | Admitting: Psychiatry

## 2018-09-08 DIAGNOSIS — F411 Generalized anxiety disorder: Secondary | ICD-10-CM

## 2018-09-08 DIAGNOSIS — F410 Panic disorder [episodic paroxysmal anxiety] without agoraphobia: Secondary | ICD-10-CM | POA: Diagnosis not present

## 2018-09-08 MED ORDER — ESCITALOPRAM OXALATE 20 MG PO TABS: 20.0000 mg | ORAL_TABLET | Freq: Every day | ORAL | 0 refills | Status: DC

## 2018-09-08 MED ORDER — MIRTAZAPINE 45 MG PO TABS: 45.0000 mg | ORAL_TABLET | Freq: Every day | ORAL | 0 refills | Status: DC

## 2018-09-08 NOTE — Telephone Encounter (Signed)
Called three times for the appointment today. She did not answer the phone. Left voice message to contact the office.

## 2018-09-08 NOTE — Patient Instructions (Signed)
1.Continuemirtazapine 45mg  at night 2. Continue lexapro 20 mg daily 3. Continue quetiapine 25 mg at night  4. Next appointment: 9/14 at 3:40

## 2018-10-15 ENCOUNTER — Ambulatory Visit: Payer: Medicare Other | Admitting: Nurse Practitioner

## 2018-11-19 ENCOUNTER — Other Ambulatory Visit: Payer: Self-pay

## 2018-11-19 ENCOUNTER — Encounter: Payer: Self-pay | Admitting: Nurse Practitioner

## 2018-11-19 ENCOUNTER — Ambulatory Visit (INDEPENDENT_AMBULATORY_CARE_PROVIDER_SITE_OTHER): Payer: Medicare Other | Admitting: Nurse Practitioner

## 2018-11-19 VITALS — BP 142/107 | HR 99 | Temp 97.1°F | Ht 64.0 in | Wt 170.2 lb

## 2018-11-19 DIAGNOSIS — K5900 Constipation, unspecified: Secondary | ICD-10-CM

## 2018-11-19 DIAGNOSIS — K219 Gastro-esophageal reflux disease without esophagitis: Secondary | ICD-10-CM

## 2018-11-19 NOTE — Patient Instructions (Signed)
Your health issues we discussed today were:   GERD (reflux/heartburn): 1. Continue taking your acid blocker 2. Let us know if you have any worsening or severe symptoms  Constipation: 1. Continue taking Linzess 290 mcg 2. You can start taking probiotics if you would like to see if this helps your occasional "bad days" 3. I am also giving you a few samples of MiraLAX.  You can take MiraLAX as needed for "bad days" to help get you through until your constipation improves 4. Call us if you have any worsening or severe symptoms  Overall I recommend:  1. Continue your other current medications 2. Return for follow-up in 6 months 3. Call us if you have any questions or concerns.   Because of recent events of COVID-19 ("Coronavirus"), follow CDC recommendations:  1. Wash your hand frequently 2. Avoid touching your face 3. Stay away from people who are sick 4. If you have symptoms such as fever, cough, shortness of breath then call your healthcare provider for further guidance 5. If you are sick, STAY AT HOME unless otherwise directed by your healthcare provider. 6. Follow directions from state and national officials regarding staying safe   At [facility] we value your feedback. You may receive a survey about your visit today. Please share your experience as we strive to create trusting relationships with our patients to provide genuine, compassionate, quality care.  We appreciate your understanding and patience as we review any laboratory studies, imaging, and other diagnostic tests that are ordered as we care for you. Our office policy is 5 business days for review of these results, and any emergent or urgent results are addressed in a timely manner for your best interest. If you do not hear from our office in 1 week, please contact us.   We also encourage the use of MyChart, which contains your medical information for your review as well. If you are not enrolled in this  feature, an access code is on this after visit summary for your convenience. Thank you for allowing [place] to be involved in your care.  It was great to see you today!  I hope you have a great summer!!

## 2018-11-19 NOTE — Progress Notes (Signed)
Referring Provider: Wyatt Haste, NP Primary Care Physician:  Wyatt Haste, NP Primary GI:  Dr. Gala Romney  Chief Complaint  Patient presents with  . Gastroesophageal Reflux  . Constipation    barley still having BM, reports taking linzess daily    HPI:   Claire [name] is a 48 y.o. female who presents for follow-up on GERD and constipation.  The patient was last seen in our office 07/22/2018 for the same.  Her last visit was a virtual office visit due to COVID-19/coronavirus pandemic.  EGD on file in August 2018 with crpe paper appearing esophageal mucosa with longitudinal furrows, no tumor or Barrett's esophagus, antral erosions but no ulcers, 2 superficial tears in the midesophagus corresponding to an area of critical narrowing.  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  Stomach biopsies positive for H. pylori treated with Pylera and esophageal biopsy with squamous mucosa with marked increase in epithelial eosinophils.  She was not able to complete her H. pylori breath test for eradication due to not able to tolerate being off PPI.  At her last visit she stated GERD was doing well without breakthrough on Prilosec 40 mg daily.  Constipation "getting worse by the day".  Linzess previously worked well for her at 74 mcg.  She was having a bowel movement once a week with hard stools and straining at that time.  This is associated with abdominal cramping that improved after a bowel movement.  No other overt GI symptoms.  Recommended continue PPI, increase Linzess to 145 mcg with progress report in 5 to 7 days, follow-up in 2 months.  Called our office 08/05/2018 indicating no improvement on Linzess 145 mcg.  Recommended samples of 290 mcg to try and with a progress report in 3 to 4 days.  At that time noted on pain medications and possibly consider OIC targeted therapy such as Amitiza 24 mcg, Movantik, or Symproic).  She called our office 08/13/2018 indicating Linzess 290 mcg is working well and a prescription  was sent to her pharmacy.  Today she states she's doing ok overall. No bowel movement in 2 days, but not eating much recently. Otherwise having pretty regular bowel movements without hard stools or straining. Intermittent episodes of straining here and there. Not on anything else for intermittent straining. GERD doing well on PPI. Denies abdominal pain, N/V, hematochezia, melena, fever, chills, unintentional weight loss. Denies URI or flu-like symptoms. Denies loss of sense of taste or smell. Denies chest pain, dyspnea, dizziness, lightheadedness, syncope, near syncope. Denies any other upper or lower GI symptoms.  Past Medical History:  Diagnosis Date  . Acid reflux   . Allergy    pollen, dust  . Anxiety   . Arthritis   . Asthma   . Carpal tunnel syndrome    bilateral  . DDD (degenerative disc disease), cervical   . Depression   . Fibromyalgia   . Hypertension   . Migraine   . Palpitations   . Panic attacks   . PTSD (post-traumatic stress disorder)   . Seizures ([place])    unknown etiology; last seizure was 2 years ago; on meds.  . Syncope and collapse   . Tennis elbow   . Ulcer   . Vertigo     Past Surgical History:  Procedure Laterality Date  . BACK SURGERY  1993  . BIOPSY  10/21/2016   Procedure: BIOPSY;  Surgeon: Daneil Dolin, MD;  [facility];  Service: Endoscopy;;  gastric, esophagus  .  CHOLECYSTECTOMY    . ESOPHAGOGASTRODUODENOSCOPY (EGD) WITH PROPOFOL N/A 10/21/2016   Procedure: ESOPHAGOGASTRODUODENOSCOPY (EGD) WITH PROPOFOL;  Surgeon: Daneil Dolin, MD;  [facility];  Service: Endoscopy;  Laterality: N/A;  9:30am  . GALLBLADDER SURGERY    . SHOULDER SURGERY Right   . SPINE SURGERY  1993   lumbar disc surgery    Current Outpatient Medications  Medication Sig Dispense Refill  . albuterol (PROVENTIL HFA;VENTOLIN HFA) 108 (90 Base) MCG/ACT inhaler Inhale 1-2 puffs into the lungs every 6 (six) hours as needed for wheezing or shortness of  breath.     . diphenhydrAMINE (BENADRYL) 25 MG tablet Take 50 mg by mouth 3 (three) times daily.     Marland Kitchen escitalopram (LEXAPRO) 20 MG tablet Take 1 tablet (20 mg total) by mouth daily. 90 tablet 0  . Gabapentin, Once-Daily, (GRALISE PO) Take by mouth. gralise 600mg  3 tablets at night    . HYDROcodone-acetaminophen (NORCO/VICODIN) 5-325 MG tablet Take 1 tablet by mouth every 4 (four) hours as needed. (Patient taking differently: Take 1 tablet by mouth every 4 (four) hours as needed. Takes 10-325 mg) 10 tablet 0  . ibuprofen (ADVIL,MOTRIN) 600 MG tablet Take 1 tablet (600 mg total) by mouth every 6 (six) hours as needed. 30 tablet 0  . linaclotide (LINZESS) 290 MCG CAPS capsule Take 1 capsule (290 mcg total) by mouth daily before breakfast. 90 capsule 3  . metoprolol succinate (TOPROL-XL) 25 MG 24 hr tablet Take 1 tablet (25 mg total) by mouth daily. 90 tablet 3  . mirtazapine (REMERON) 45 MG tablet Take 1 tablet (45 mg total) by mouth at bedtime. 90 tablet 0  . Multiple Vitamin (MULTIVITAMIN WITH MINERALS) TABS tablet Take 2 tablets by mouth daily.     Marland Kitchen omeprazole (PRILOSEC) 40 MG capsule Take 1 capsule (40 mg total) by mouth daily. 90 capsule 1  . QUEtiapine (SEROQUEL) 25 MG tablet Take 1 tablet (25 mg total) by mouth at bedtime. 90 tablet 0  . tiZANidine (ZANAFLEX) 2 MG tablet Take 2 mg by mouth 3 (three) times daily.     No current facility-administered medications for this visit.     Allergies as of 11/19/2018 - Review Complete 11/19/2018  Allergen Reaction Noted  . Baclofen Shortness Of Breath 10/10/2016  . Claritin [loratadine] Other (See Comments) 07/28/2014  . Cymbalta [duloxetine hcl] Hives 07/29/2012  . Penicillins Hives 07/29/2012  . Sulfa antibiotics Hives 07/29/2012  . Ultram [tramadol] Other (See Comments) 09/12/2014    Family History  Problem Relation Age of Onset  . COPD Mother   . Bronchitis Mother   . Arthritis Mother   . Depression Mother   . Heart disease Mother    . Hypertension Mother   . Colon cancer Maternal Grandmother   . Alcohol abuse Father   . Early death Father        GSW  . PKU Son     Social History   Socioeconomic History  . Marital status: Single    Spouse name: Not on file  . Number of children: 1  . Years of education: HS  . Highest education level: Not on file  Occupational History  . Occupation: disability    Comment: unemployed  Social Needs  . Financial resource strain: Not on file  . Food insecurity    Worry: Not on file    Inability: Not on file  . Transportation needs    Medical: Not on file    Non-medical: Not on  file  Tobacco Use  . Smoking status: Never Smoker  . Smokeless tobacco: Never Used  Substance and Sexual Activity  . Alcohol use: Not Currently    Comment: drink occasionally  . Drug use: No  . Sexual activity: Not Currently    Partners: Female    Birth control/protection: None  Lifestyle  . Physical activity    Days per week: Not on file    Minutes per session: Not on file  . Stress: Not on file  Relationships  . Social Herbalist on phone: Not on file    Gets together: Not on file    Attends religious service: Not on file    Active member of club or organization: Not on file    Attends meetings of clubs or organizations: Not on file    Relationship status: Not on file  Other Topics Concern  . Not on file  Social History Narrative   Patient is left handed.   Patient drinks very little caffeine.   Lives alone    Review of Systems: General: Negative for anorexia, weight loss, fever, chills, fatigue, weakness. ENT: Negative for hoarseness, difficulty swallowing. CV: Negative for chest pain, angina, palpitations, peripheral edema.  Respiratory: Negative for dyspnea at rest, cough, sputum, wheezing.  GI: See history of present illness. Endo: Negative for unusual weight change.  Heme: Negative for bruising or bleeding. Allergy: Negative for rash or hives.   Physical  Exam: BP (!) 142/107   Pulse 99   Temp (!) 97.1 F (36.2 C) (Oral)   Ht 5\' 4"  (1.626 m)   Wt 170 lb 3.2 oz (77.2 kg)   BMI 29.21 kg/m  General:   Alert and oriented. Pleasant and cooperative. Well-nourished and well-developed.  Eyes:  Without icterus, sclera clear and conjunctiva pink.  Ears:  Normal auditory acuity. Cardiovascular:  S1, S2 present without murmurs appreciated. Extremities without clubbing or edema. Respiratory:  Clear to auscultation bilaterally. No wheezes, rales, or rhonchi. No distress.  Gastrointestinal:  +BS, soft, non-tender and non-distended. No HSM noted. No guarding or rebound. No masses appreciated.  Rectal:  Deferred  Musculoskalatal:  Symmetrical without gross deformities. Skin:  Intact without significant lesions or rashes. Neurologic:  Alert and oriented x4;  grossly normal neurologically. Psych:  Alert and cooperative. Normal mood and affect. Heme/Lymph/Immune: No excessive bruising noted.    11/19/2018 3:24 PM   Disclaimer: This note was dictated with voice recognition software. Similar sounding words can inadvertently be transcribed and may not be corrected upon review.

## 2018-11-19 NOTE — Assessment & Plan Note (Signed)
Overall constipation is generally well managed on Linzess 290 mcg.  Most days she feels her bowels remain regular and soft.  She intermittently will have a "bad day" such as currently where she has not had a bowel movement 2 days.  However, she does note that she has not eaten as much in the past couple days.  She is asked about probiotics and I indicated that she could try probiotics to see if this helps.  I will also provide her with some samples of MiraLAX.  She can use MiraLAX as needed for "bad constipation days" to help her at that time until her symptoms resolve.  Follow-up in 6 months.

## 2018-11-19 NOTE — Assessment & Plan Note (Signed)
GERD generally well managed.  Recommend she continue her PPI and follow-up in 6 months.

## 2018-11-20 ENCOUNTER — Encounter: Payer: Self-pay | Admitting: Internal Medicine

## 2018-11-21 ENCOUNTER — Other Ambulatory Visit: Payer: Self-pay | Admitting: Nurse Practitioner

## 2018-11-21 DIAGNOSIS — K219 Gastro-esophageal reflux disease without esophagitis: Secondary | ICD-10-CM

## 2018-11-24 ENCOUNTER — Other Ambulatory Visit: Payer: Self-pay
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
## 2018-11-30 ENCOUNTER — Encounter (HOSPITAL_COMMUNITY): Payer: Self-pay | Admitting: Psychiatry

## 2018-11-30 ENCOUNTER — Ambulatory Visit (INDEPENDENT_AMBULATORY_CARE_PROVIDER_SITE_OTHER): Payer: Medicare Other | Admitting: Psychiatry

## 2018-11-30 ENCOUNTER — Other Ambulatory Visit: Payer: Self-pay

## 2018-11-30 DIAGNOSIS — F410 Panic disorder [episodic paroxysmal anxiety] without agoraphobia: Secondary | ICD-10-CM | POA: Diagnosis not present

## 2018-11-30 DIAGNOSIS — F411 Generalized anxiety disorder: Secondary | ICD-10-CM

## 2018-11-30 MED ORDER — QUETIAPINE FUMARATE 50 MG PO TABS: 50.0000 mg | ORAL_TABLET | Freq: Every day | ORAL | 0 refills | Status: DC

## 2018-11-30 MED ORDER — MIRTAZAPINE 45 MG PO TABS: 45.0000 mg | ORAL_TABLET | Freq: Every day | ORAL | 0 refills | Status: DC

## 2018-11-30 MED ORDER — ESCITALOPRAM OXALATE 20 MG PO TABS: 20.0000 mg | ORAL_TABLET | Freq: Every day | ORAL | 0 refills | Status: DC

## 2018-11-30 NOTE — Patient Instructions (Signed)
1.Continuemirtazapine 45mg  at night 2. Continue lexapro 20 mg daily 3. Increase quetiapine 50 mg at night  4. Next appointment: 11/9 at 1:40

## 2019-01-13 ENCOUNTER — Other Ambulatory Visit: Payer: Self-pay | Admitting: *Deleted

## 2019-01-13 DIAGNOSIS — Z20822 Contact with and (suspected) exposure to covid-19: Secondary | ICD-10-CM

## 2019-01-14 LAB — NOVEL CORONAVIRUS, NAA: SARS-CoV-2, NAA: NOT DETECTED
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
## 2019-01-25 ENCOUNTER — Ambulatory Visit (INDEPENDENT_AMBULATORY_CARE_PROVIDER_SITE_OTHER): Payer: Medicare Other | Admitting: Psychiatry

## 2019-01-25 ENCOUNTER — Encounter (HOSPITAL_COMMUNITY): Payer: Self-pay | Admitting: Psychiatry

## 2019-01-25 ENCOUNTER — Other Ambulatory Visit: Payer: Self-pay

## 2019-01-25 DIAGNOSIS — F410 Panic disorder [episodic paroxysmal anxiety] without agoraphobia: Secondary | ICD-10-CM | POA: Diagnosis not present

## 2019-01-25 DIAGNOSIS — F411 Generalized anxiety disorder: Secondary | ICD-10-CM | POA: Diagnosis not present

## 2019-01-25 MED ORDER — ESCITALOPRAM OXALATE 20 MG PO TABS: 20.0000 mg | ORAL_TABLET | Freq: Every day | ORAL | 1 refills | Status: DC

## 2019-01-25 MED ORDER — QUETIAPINE FUMARATE 50 MG PO TABS: 50.0000 mg | ORAL_TABLET | Freq: Every day | ORAL | 1 refills | Status: DC

## 2019-01-25 MED ORDER — MIRTAZAPINE 45 MG PO TABS: 45.0000 mg | ORAL_TABLET | Freq: Every day | ORAL | 1 refills | Status: DC

## 2019-01-25 NOTE — Patient Instructions (Signed)
1. Continue lexapro 20 mg daily 2.Continuemirtazapine 45mg  at night 3.Continue quetiapine 50 mg at night  4. Next appointment: in February

## 2019-04-19 ENCOUNTER — Ambulatory Visit: Payer: Medicare Other | Admitting: Psychiatry

## 2019-05-10 ENCOUNTER — Encounter: Payer: Self-pay | Admitting: Psychiatry

## 2019-05-10 ENCOUNTER — Other Ambulatory Visit: Payer: Self-pay

## 2019-05-10 ENCOUNTER — Ambulatory Visit (INDEPENDENT_AMBULATORY_CARE_PROVIDER_SITE_OTHER): Payer: Medicare Other | Admitting: Psychiatry

## 2019-05-10 DIAGNOSIS — F410 Panic disorder [episodic paroxysmal anxiety] without agoraphobia: Secondary | ICD-10-CM | POA: Diagnosis not present

## 2019-05-10 DIAGNOSIS — F4310 Post-traumatic stress disorder, unspecified: Secondary | ICD-10-CM

## 2019-05-10 DIAGNOSIS — F411 Generalized anxiety disorder: Secondary | ICD-10-CM | POA: Diagnosis not present

## 2019-05-10 MED ORDER — ESCITALOPRAM OXALATE 20 MG PO TABS: 20.0000 mg | ORAL_TABLET | Freq: Every day | ORAL | 0 refills | Status: DC

## 2019-05-10 MED ORDER — QUETIAPINE FUMARATE 50 MG PO TABS: 50.0000 mg | ORAL_TABLET | Freq: Every day | ORAL | 0 refills | Status: DC

## 2019-05-10 MED ORDER — MIRTAZAPINE 45 MG PO TABS: 45.0000 mg | ORAL_TABLET | Freq: Every day | ORAL | 0 refills | Status: DC

## 2019-05-10 MED ORDER — BUSPIRONE HCL 10 MG PO TABS: 10.0000 mg | ORAL_TABLET | Freq: Three times a day (TID) | ORAL | 0 refills | Status: DC

## 2019-05-10 NOTE — Progress Notes (Signed)
[facility] MD OP Progress Note  Virtual Visit via Telephone Note  I connected with [place] on 05/10/19 at  2:00 PM EST by telephone and verified that I am speaking with the correct person using two identifiers.  I discussed the limitations, risks, security and privacy concerns of performing an evaluation and management service by telephone and the availability of in person appointments. I also discussed with the patient that there may be a patient responsible charge related to this service. The patient expressed understanding and agreed to proceed.   05/10/2019 2:15 PM Claire [name]  MRN:  [id number]  Chief Complaint: " I have been having anxiety and panic attacks."  HPI: Patient reported that she has not been doing well for the past month or so.  She informed that she was recently started on an antihypertensive recently and ever since then she has been feeling lightheaded and dizzy.  She passed out and fell a few days ago. Her son was trying to get in touch with her and kept calling her phone but was unsuccessful which led to some interventions. She stated that she is going to be seeing a specialist this week to help her figure this out. She also reported having a lot of anxiety and panic attacks.  She takes hydrocodone for chronic pain.  She was offered buspirone to help her with anxiety and panic attacks.   Visit Diagnosis:    ICD-10-CM   1. Generalized anxiety disorder  F41.1 escitalopram (LEXAPRO) 20 MG tablet    QUEtiapine (SEROQUEL) 50 MG tablet    mirtazapine (REMERON) 45 MG tablet    busPIRone (BUSPAR) 10 MG tablet  2. Panic disorder  F41.0 escitalopram (LEXAPRO) 20 MG tablet    mirtazapine (REMERON) 45 MG tablet    busPIRone (BUSPAR) 10 MG tablet  3. PTSD (post-traumatic stress disorder)  F43.10 escitalopram (LEXAPRO) 20 MG tablet    mirtazapine (REMERON) 45 MG tablet    Past Psychiatric History: GAD, Panic d/o, PTSD  Past Medical History:  Past Medical History:  Diagnosis  Date  . Acid reflux   . Allergy    pollen, dust  . Anxiety   . Arthritis   . Asthma   . Carpal tunnel syndrome    bilateral  . DDD (degenerative disc disease), cervical   . Depression   . Fibromyalgia   . Hypertension   . Migraine   . Palpitations   . Panic attacks   . PTSD (post-traumatic stress disorder)   . Seizures ([place])    unknown etiology; last seizure was 2 years ago; on meds.  . Syncope and collapse   . Tennis elbow   . Ulcer   . Vertigo     Past Surgical History:  Procedure Laterality Date  . BACK SURGERY  1993  . BIOPSY  10/21/2016   Procedure: BIOPSY;  Surgeon: Daneil Dolin, MD;  Location: [facility];  Service: Endoscopy;;  gastric, esophagus  . CHOLECYSTECTOMY    . ESOPHAGOGASTRODUODENOSCOPY (EGD) WITH PROPOFOL N/A 10/21/2016   Procedure: ESOPHAGOGASTRODUODENOSCOPY (EGD) WITH PROPOFOL;  Surgeon: Daneil Dolin, MD;  Location: [facility];  Service: Endoscopy;  Laterality: N/A;  9:30am  . GALLBLADDER SURGERY    . SHOULDER SURGERY Right   . SPINE SURGERY  1993   lumbar disc surgery    Family Psychiatric History: see below  Family History:  Family History  Problem Relation Age of Onset  . COPD Mother   . Bronchitis Mother   .  Arthritis Mother   . Depression Mother   . Heart disease Mother   . Hypertension Mother   . Colon cancer Maternal Grandmother   . Alcohol abuse Father   . Early death Father        GSW  . PKU Son     Social History:  Social History   Socioeconomic History  . Marital status: Single    Spouse name: Not on file  . Number of children: 1  . Years of education: HS  . Highest education level: Not on file  Occupational History  . Occupation: disability    Comment: unemployed  Tobacco Use  . Smoking status: Never Smoker  . Smokeless tobacco: Never Used  Substance and Sexual Activity  . Alcohol use: Not Currently    Comment: drink occasionally  . Drug use: No  . Sexual activity: Not Currently    Partners: Female     Birth control/protection: None  Other Topics Concern  . Not on file  Social History Narrative   Patient is left handed.   Patient drinks very little caffeine.   Lives alone   Social Determinants of Health   Financial Resource Strain:   . Difficulty of Paying Living Expenses: Not on file  Food Insecurity:   . Worried About Charity fundraiser in the Last Year: Not on file  . Ran Out of Food in the Last Year: Not on file  Transportation Needs:   . Lack of Transportation (Medical): Not on file  . Lack of Transportation (Non-Medical): Not on file  Physical Activity:   . Days of Exercise per Week: Not on file  . Minutes of Exercise per Session: Not on file  Stress:   . Feeling of Stress : Not on file  Social Connections:   . Frequency of Communication with Friends and Family: Not on file  . Frequency of Social Gatherings with Friends and Family: Not on file  . Attends Religious Services: Not on file  . Active Member of Clubs or Organizations: Not on file  . Attends Archivist Meetings: Not on file  . Marital Status: Not on file    Allergies:  Allergies  Allergen Reactions  . Baclofen Shortness Of Breath    Swollen ankles  . Claritin [Loratadine] Other (See Comments)    shaking  . Cymbalta [Duloxetine Hcl] Hives    "Hives, forgot who I was"  . Penicillins Hives    Has patient had a PCN reaction causing immediate rash, facial/tongue/throat swelling, SOB or lightheadedness with hypotension: Unknown Has patient had a PCN reaction causing severe rash involving mucus membranes or skin necrosis: Yes Has patient had a PCN reaction that required hospitalization: Unknown Has patient had a PCN reaction occurring within the last 10 years: Unknown If all of the above answers are "NO", then may proceed with Cephalosporin use.  . Sulfa Antibiotics Hives  . Ultram [Tramadol] Other (See Comments)    Dizzy    Metabolic Disorder Labs: Lab Results  Component Value Date    HGBA1C 4.9 09/13/2016   MPG 94 09/13/2016   No results found for: PROLACTIN No results found for: CHOL, TRIG, HDL, CHOLHDL, VLDL, LDLCALC Lab Results  Component Value Date   TSH 0.553 10/25/2014    Therapeutic Level Labs: No results found for: LITHIUM No results found for: VALPROATE No components found for:  CBMZ  Current Medications: Current Outpatient Medications  Medication Sig Dispense Refill  . albuterol (PROVENTIL HFA;VENTOLIN HFA) 108 (90 Base)  MCG/ACT inhaler Inhale 1-2 puffs into the lungs every 6 (six) hours as needed for wheezing or shortness of breath.     . busPIRone (BUSPAR) 10 MG tablet Take 1 tablet (10 mg total) by mouth 3 (three) times daily. 270 tablet 0  . diphenhydrAMINE (BENADRYL) 25 MG tablet Take 50 mg by mouth 3 (three) times daily.     Marland Kitchen escitalopram (LEXAPRO) 20 MG tablet Take 1 tablet (20 mg total) by mouth daily. 90 tablet 0  . Gabapentin, Once-Daily, (GRALISE PO) Take by mouth. gralise 600mg  3 tablets at night    . HYDROcodone-acetaminophen (NORCO/VICODIN) 5-325 MG tablet Take 1 tablet by mouth every 4 (four) hours as needed. (Patient taking differently: Take 1 tablet by mouth every 4 (four) hours as needed. Takes 10-325 mg) 10 tablet 0  . ibuprofen (ADVIL,MOTRIN) 600 MG tablet Take 1 tablet (600 mg total) by mouth every 6 (six) hours as needed. 30 tablet 0  . linaclotide (LINZESS) 290 MCG CAPS capsule Take 1 capsule (290 mcg total) by mouth daily before breakfast. 90 capsule 3  . metoprolol succinate (TOPROL-XL) 25 MG 24 hr tablet Take 1 tablet (25 mg total) by mouth daily. 90 tablet 3  . mirtazapine (REMERON) 45 MG tablet Take 1 tablet (45 mg total) by mouth at bedtime. 90 tablet 0  . Multiple Vitamin (MULTIVITAMIN WITH MINERALS) TABS tablet Take 2 tablets by mouth daily.     Marland Kitchen omeprazole (PRILOSEC) 40 MG capsule Take 1 capsule (40 mg total) by mouth daily before breakfast. 90 capsule 3  . QUEtiapine (SEROQUEL) 50 MG tablet Take 1 tablet (50 mg total) by  mouth at bedtime. 90 tablet 0  . tiZANidine (ZANAFLEX) 2 MG tablet Take 2 mg by mouth 3 (three) times daily.     No current facility-administered medications for this visit.    Musculoskeletal: Strength & Muscle Tone: unable to assess due to telemed visit Gait & Station: unable to assess due to telemed visit Patient leans: unable to assess due to telemed visit   Psychiatric Specialty Exam: Review of Systems  There were no vitals taken for this visit.There is no height or weight on file to calculate BMI.  General Appearance: unable to assess due to phone visit. Sounded to be in mild distress due to ongoing dizziness  Eye Contact:  unable to assess due to phone visit  Speech:  Normal Rate  Volume:  Normal  Mood:  Depressed  Affect:  Sad  Thought Process:  Goal Directed, Linear and Descriptions of Associations: Intact  Orientation:  Full (Time, Place, and Person)  Thought Content: Logical   Suicidal Thoughts:  No  Homicidal Thoughts:  No  Memory:  Recent;   Good Remote;   Good  Judgement:  Fair  Insight:  Fair  Psychomotor Activity:  Normal  Concentration:  Concentration: Good and Attention Span: Good  Recall:  Good  Fund of Knowledge: Good  Language: Good  Akathisia:  Negative  Handed:  Right  AIMS (if indicated): not done  Assets:  Communication Skills Desire for Improvement Financial Resources/Insurance Housing  ADL's:  Intact  Cognition: WNL  Sleep:  Good     Screenings: GAD-7     Counselor from 09/05/2017 in [facility] from 07/23/2017 in [facility]  Total GAD-7 Score  20  19    PHQ2-9     Counselor from 09/05/2017 in [facility] from 07/23/2017 in [facility]  Office Visit from 03/17/2017 in [facility] Office Visit from 12/26/2016 in [facility]  Visit from 08/14/2016 in [facility]  PHQ-2 Total Score  6  5  0  0  0  PHQ-9 Total Score  23  18  --  --  --       Assessment and Plan: 49 year old female with history of GAD, panic disorder, ADHD, hypertension, chronic pain now contacted via phone for follow-up.  Patient appears to be in mild distress due to dizziness spells that started after she was placed on an antihypertensive.  She is seeing a specialist this week regarding that.  She reported having anxiety and panic attacks and was agreeable to a trial of buspirone.  She was advised to start with 10 mg twice daily first and increase to 10 mg 3 times a day based on the response.   1. Generalized anxiety disorder  - escitalopram (LEXAPRO) 20 MG tablet; Take 1 tablet (20 mg total) by mouth daily.  Dispense: 90 tablet; Refill: 0 - QUEtiapine (SEROQUEL) 50 MG tablet; Take 1 tablet (50 mg total) by mouth at bedtime.  Dispense: 90 tablet; Refill: 0 - mirtazapine (REMERON) 45 MG tablet; Take 1 tablet (45 mg total) by mouth at bedtime.  Dispense: 90 tablet; Refill: 0 - busPIRone (BUSPAR) 10 MG tablet; Take 1 tablet (10 mg total) by mouth 3 (three) times daily.  Dispense: 270 tablet; Refill: 0  2. Panic disorder  - escitalopram (LEXAPRO) 20 MG tablet; Take 1 tablet (20 mg total) by mouth daily.  Dispense: 90 tablet; Refill: 0 - mirtazapine (REMERON) 45 MG tablet; Take 1 tablet (45 mg total) by mouth at bedtime.  Dispense: 90 tablet; Refill: 0 - busPIRone (BUSPAR) 10 MG tablet; Take 1 tablet (10 mg total) by mouth 3 (three) times daily.  Dispense: 270 tablet; Refill: 0  3. PTSD (post-traumatic stress disorder) - escitalopram (LEXAPRO) 20 MG tablet; Take 1 tablet (20 mg total) by mouth daily.  Dispense: 90 tablet; Refill: 0 - mirtazapine (REMERON) 45 MG tablet; Take 1 tablet (45 mg total) by mouth at bedtime.  Dispense: 90 tablet; Refill: 0  F/up in 6 weeks.  [place] Crane, MD 05/10/2019, 2:15 PM

## 2019-05-13 ENCOUNTER — Encounter: Payer: Self-pay | Admitting: Allergy

## 2019-05-13 ENCOUNTER — Ambulatory Visit
Admission: RE | Admit: 2019-05-13 | Discharge: 2019-05-13 | Disposition: A | Discharge status: Home / Self Care | Payer: Medicare Other | Source: Ambulatory Visit | Attending: Allergy | Admitting: Allergy

## 2019-05-13 ENCOUNTER — Ambulatory Visit (INDEPENDENT_AMBULATORY_CARE_PROVIDER_SITE_OTHER): Payer: Medicare Other | Admitting: Allergy

## 2019-05-13 ENCOUNTER — Other Ambulatory Visit: Payer: Self-pay

## 2019-05-13 VITALS — BP 120/96 | HR 103 | Temp 98.0°F | Resp 22 | Ht 64.0 in | Wt 175.4 lb

## 2019-05-13 DIAGNOSIS — K9049 Malabsorption due to intolerance, not elsewhere classified: Secondary | ICD-10-CM

## 2019-05-13 DIAGNOSIS — J310 Chronic rhinitis: Secondary | ICD-10-CM

## 2019-05-13 DIAGNOSIS — T50905D Adverse effect of unspecified drugs, medicaments and biological substances, subsequent encounter: Secondary | ICD-10-CM | POA: Diagnosis not present

## 2019-05-13 DIAGNOSIS — J4550 Severe persistent asthma, uncomplicated: Secondary | ICD-10-CM | POA: Diagnosis not present

## 2019-05-13 DIAGNOSIS — G4730 Sleep apnea, unspecified: Secondary | ICD-10-CM

## 2019-05-13 MED ORDER — BUDESONIDE-FORMOTEROL FUMARATE 160-4.5 MCG/ACT IN AERO: 2.0000 | INHALATION_SPRAY | Freq: Two times a day (BID) | RESPIRATORY_TRACT | 5 refills | Status: DC

## 2019-05-13 MED ORDER — ALBUTEROL SULFATE HFA 108 (90 BASE) MCG/ACT IN AERS: 2.0000 | INHALATION_SPRAY | RESPIRATORY_TRACT | 1 refills | Status: DC | PRN

## 2019-05-13 MED ORDER — MONTELUKAST SODIUM 10 MG PO TABS: 10.0000 mg | ORAL_TABLET | Freq: Every day | ORAL | 5 refills | Status: DC

## 2019-05-13 MED ORDER — CARBINOXAMINE MALEATE 6 MG PO TABS: 6.0000 mg | ORAL_TABLET | Freq: Two times a day (BID) | ORAL | 5 refills | Status: DC

## 2019-05-13 MED ORDER — ALBUTEROL SULFATE (2.5 MG/3ML) 0.083% IN NEBU: 2.5000 mg | INHALATION_SOLUTION | RESPIRATORY_TRACT | 1 refills | Status: DC | PRN

## 2019-05-13 NOTE — Patient Instructions (Addendum)
-   lung function testing looks great today however you are quite symptomatic despite this normal function  - recommend you have a CXR performed to ensure there are no other potential reasons for your continued respiratory symptoms  - stop Symbicort 7mcg and start Symbicort 122mcg 2 puffs twice a day with spacer  - start Singulair 10mg  daily at bedtime.  Singulair is a leukotriene blocker that can help control reactive airway symptoms and allergy symptoms.  If you notice any change in mood/behavior/sleep after starting Singulair then stop this medication and let us know.  Symptoms resolve after stopping the medication.    - have access to albuterol inhaler 2 puffs every 4-6 hours as needed for cough/wheeze/shortness of breath/chest tightness.  May use 15-20 minutes prior to activity.   Monitor frequency of use.    - will obtain CBC w differential to determine if you may have eosinophilic based asthma  Asthma control goals:   Full participation in all desired activities (may need albuterol before activity)  Albuterol use two time or less a week on average (not counting use with activity)  Cough interfering with sleep two time or less a month  Oral steroids no more than once a year  No hospitalizations   - will obtain environmental allergy to determine what you are sensitive to in the environment  - recommend trial of Carbinoxamine 6mg  twice a day.  This is an antihistamine similar to benadryl but is longer acting than benadryl.  This is a prescription based medication.     - will obtain labwork for wheat, rye, barley, oat (gluten products) to determine if you are allergic.  If testing is positive then will prescribe you an epinephrine device.     - continue to avoid loratadine (claritin).    Follow-up 3 months or sooner if needed
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
## 2019-05-13 NOTE — Progress Notes (Signed)
New Patient Note  RE: Claire [name] MRN: [id number] DOB: 02-22-1971 Date of Office Visit: 05/13/2019  Referring provider: Dara Lords, NP Primary care provider: Wyatt Haste, NP  Chief Complaint: Short of breath  History of present illness: Claire [name] is a 49 y.o. female presenting today for consultation for asthma.  She was diagnosed with adult-onset asthma about 10 years ago.  She feels like her symptoms are getting worse.  She has shortness of breath, coughing, wheezing, chest tightness but primarily has shortness of breath. She states wearing a mask makes it worse and reports she can't breathe.  She reports having nighttime nighttime awakenings where she wakes up like she can't breathe.  She has a Symbicort 25mcg however uses it or her albuterol as needed interchangeably.  She does state however that she tries to use her albuterol first and then will use the symbicort if doesn't get relief with albuterol.  She does not recall using any systemic steroid or injections for asthma exacerbation.  She denies any hospitalization for asthma exacerbations.    She also has significant nasal congestion constantly and runny drainage.  She also reports sneezing.  She is taking generic benadryl that she takes 2-3 times a day.   She states she can't stand anything going into nose.    She states she is allergic to loratadine.  When she used it made her shake and her eyes became "bloodshot red" and had tachycardia.    She states she is allergic to gluten products and it causes her to break out in a rash.  She has been avoiding gluten products.  This occurred about 3-4 years ago.    No history of eczema.   Review of systems: Review of Systems  Constitutional: Negative.   HENT: Positive for congestion.   Eyes: Negative.   Respiratory: Positive for cough, shortness of breath and wheezing.   Cardiovascular: Negative.   Gastrointestinal: Negative.   Musculoskeletal: Negative.   Skin:  Negative.   Neurological: Negative.     All other systems negative unless noted above in HPI  Past medical history: Past Medical History:  Diagnosis Date  . Acid reflux   . Allergy    pollen, dust  . Anxiety   . Arthritis   . Asthma   . Carpal tunnel syndrome    bilateral  . DDD (degenerative disc disease), cervical   . Depression   . Fibromyalgia   . Hypertension   . Migraine   . Palpitations   . Panic attacks   . PTSD (post-traumatic stress disorder)   . Seizures ([place])    unknown etiology; last seizure was 2 years ago; on meds.  . Syncope and collapse   . Tennis elbow   . Ulcer   . Vertigo     Past surgical history: Past Surgical History:  Procedure Laterality Date  . BACK SURGERY  1993  . BIOPSY  10/21/2016   Procedure: BIOPSY;  Surgeon: Daneil Dolin, MD;  [facility];  Service: Endoscopy;;  gastric, esophagus  . CHOLECYSTECTOMY    . ESOPHAGOGASTRODUODENOSCOPY (EGD) WITH PROPOFOL N/A 10/21/2016   Procedure: ESOPHAGOGASTRODUODENOSCOPY (EGD) WITH PROPOFOL;  Surgeon: Daneil Dolin, MD;  [facility];  Service: Endoscopy;  Laterality: N/A;  9:30am  . GALLBLADDER SURGERY    . SHOULDER SURGERY Right   . SPINE SURGERY  1993   lumbar disc surgery    Family history:  Family History  Problem Relation Age  of Onset  . COPD Mother   . Bronchitis Mother   . Arthritis Mother   . Depression Mother   . Heart disease Mother   . Hypertension Mother   . Colon cancer Maternal Grandmother   . Alcohol abuse Father   . Early death Father        GSW  . PKU Son     Social history: Lives in a condo with carpeting with electric heating and central cooling.  There are no pets in the home.  There are cats outside the home.  There is no concern for water damage, mildew or roaches in the home.  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  She does smoke cigarettes half pack a day for the past 15  years.  Medication List: Current Outpatient Medications  Medication Sig Dispense Refill  . albuterol (PROVENTIL HFA;VENTOLIN HFA) 108 (90 Base) MCG/ACT inhaler Inhale 1-2 puffs into the lungs every 6 (six) hours as needed for wheezing or shortness of breath.     . busPIRone (BUSPAR) 10 MG tablet Take 1 tablet (10 mg total) by mouth 3 (three) times daily. 270 tablet 0  . diphenhydrAMINE (BENADRYL) 25 MG tablet Take 50 mg by mouth 3 (three) times daily.     Marland Kitchen escitalopram (LEXAPRO) 20 MG tablet Take 1 tablet (20 mg total) by mouth daily. 90 tablet 0  . Gabapentin, Once-Daily, (GRALISE PO) Take by mouth. gralise 600mg  3 tablets at night    . HYDROcodone-acetaminophen (NORCO/VICODIN) 5-325 MG tablet Take 1 tablet by mouth every 4 (four) hours as needed. (Patient taking differently: Take 1 tablet by mouth every 4 (four) hours as needed. Takes 10-325 mg) 10 tablet 0  . ibuprofen (ADVIL,MOTRIN) 600 MG tablet Take 1 tablet (600 mg total) by mouth every 6 (six) hours as needed. 30 tablet 0  . linaclotide (LINZESS) 290 MCG CAPS capsule Take 1 capsule (290 mcg total) by mouth daily before breakfast. 90 capsule 3  . metoprolol succinate (TOPROL-XL) 25 MG 24 hr tablet Take 1 tablet (25 mg total) by mouth daily. 90 tablet 3  . mirtazapine (REMERON) 45 MG tablet Take 1 tablet (45 mg total) by mouth at bedtime. 90 tablet 0  . Multiple Vitamin (MULTIVITAMIN WITH MINERALS) TABS tablet Take 2 tablets by mouth daily.     Marland Kitchen omeprazole (PRILOSEC) 40 MG capsule Take 1 capsule (40 mg total) by mouth daily before breakfast. 90 capsule 3  . QUEtiapine (SEROQUEL) 50 MG tablet Take 1 tablet (50 mg total) by mouth at bedtime. 90 tablet 0  . tiZANidine (ZANAFLEX) 2 MG tablet Take 2 mg by mouth 3 (three) times daily.    Marland Kitchen albuterol (PROVENTIL) (2.5 MG/3ML) 0.083% nebulizer solution Take 3 mLs (2.5 mg total) by nebulization every 4 (four) hours as needed for wheezing or shortness of breath. 75 mL 1  . albuterol (VENTOLIN HFA)  108 (90 Base) MCG/ACT inhaler Inhale 2 puffs into the lungs every 4 (four) hours as needed. 18 g 1  . budesonide-formoterol (SYMBICORT) 160-4.5 MCG/ACT inhaler Inhale 2 puffs into the lungs 2 (two) times daily. 1 Inhaler 5  . Carbinoxamine Maleate 6 MG TABS Take 6 mg by mouth in the morning and at bedtime. 60 tablet 5  . montelukast (SINGULAIR) 10 MG tablet Take 1 tablet (10 mg total) by mouth at bedtime. 30 tablet 5   No current facility-administered medications for this visit.    Known medication allergies:  Allergies  Allergen Reactions  . Baclofen Shortness Of Breath    Swollen ankles  . Claritin [Loratadine] Other (See Comments)    shaking  . Cymbalta [Duloxetine Hcl] Hives    "Hives, forgot who I was"  . Penicillins Hives    Has patient had a PCN reaction causing immediate rash, facial/tongue/throat swelling, SOB or lightheadedness with hypotension: Unknown Has patient had a PCN reaction causing severe rash involving mucus membranes or skin necrosis: Yes Has patient had a PCN reaction that required hospitalization: Unknown Has patient had a PCN reaction occurring within the last 10 years: Unknown If all of the above answers are "NO", then may proceed with Cephalosporin use.  . Sulfa Antibiotics Hives  . Ultram [Tramadol] Other (See Comments)    Dizzy     Physical examination: Blood pressure (!) 120/96, pulse (!) 103, temperature 98 F (36.7 C), temperature source Temporal, resp. rate (!) 22, height 5\' 4"  (1.626 m), weight 175 lb 6.4 oz (79.6 kg), SpO2 96 %.  General: Alert, interactive, in no acute distress. HEENT: PERRLA, TMs pearly gray, turbinates moderately edematous with clear discharge, post-pharynx non erythematous. Neck: Supple without lymphadenopathy. Lungs: Mildly decreased breath sounds bilaterally without wheezing, rhonchi or rales. {increased work of breathing. CV: Normal S1, S2 without murmurs. Abdomen: Nondistended, nontender. Skin: Warm and dry, without  lesions or rashes. Extremities:  No clubbing, cyanosis or edema. Neuro:   Grossly intact.  Diagnositics/Labs:  Spirometry: FEV1: 2.5L 107%, FVC: 3.13L 107% predicted.  Reported chest tightness/pain after 2 blows of spirometry.  Duoneb administered with resolution of symptoms.    Assessment and plan: Severe persistent asthma Rhinitis ?Food intolerance Adverse drug effect    - lung function testing looks great today however you are quite symptomatic despite this normal function.  This is a bit puzzling and I question if symptoms are purely related to asthma like a cardiac cause or VCD  - recommend you have a CXR performed to ensure there are no other potential reasons for your continued respiratory symptoms  - stop Symbicort 20mcg and start Symbicort 166mcg 2 puffs twice a day with spacer  - start Singulair 10mg  daily at bedtime.  Singulair is a leukotriene blocker that can help control reactive airway symptoms and allergy symptoms.  If you notice any change in mood/behavior/sleep after starting Singulair then stop this medication and let us know.  Symptoms resolve after stopping the medication.    - have access to albuterol inhaler 2 puffs every 4-6 hours as needed for cough/wheeze/shortness of breath/chest tightness.  May use 15-20 minutes prior to activity.   Monitor frequency of use.    - will obtain CBC w differential to determine if you may have eosinophilic based asthma  Asthma control goals:   Full participation in all desired activities (may need albuterol before activity)  Albuterol use two time or less a week on average (not counting use with activity)  Cough interfering with sleep two time or less a month  Oral steroids no more than once a year  No hospitalizations   - will obtain environmental allergy to determine what you are sensitive to in the environment  - recommend trial of Carbinoxamine 6mg  twice a day.  This is an antihistamine similar to benadryl but is longer  acting than benadryl.  This is a prescription based medication.  Will try to not use any second generation antihistamines as she reports adverse events with use of loratadine.   - will obtain labwork for wheat, rye,  barley, oat (gluten products) to determine if you are allergic.  If testing is positive then will prescribe you an epinephrine device.     - continue to avoid loratadine (claritin).    Follow-up 3 months or sooner if needed  I appreciate the opportunity to take part in Adriahna's care. Please do not hesitate to contact me with questions.  Sincerely,   Prudy Feeler, MD Allergy/Immunology [facility]

## 2019-05-15 LAB — IGE+ALLERGENS ZONE 2(30)
Alternaria Alternata IgE: <0.1 kU/L
Amer Sycamore IgE Qn: 2.82 kU/L — AB
Aspergillus Fumigatus IgE: <0.1 kU/L
Bahia Grass IgE: 1.22 kU/L — AB
Bermuda Grass IgE: 0.98 kU/L — AB
Cat Dander IgE: 1.46 kU/L — AB
Cedar, Mountain IgE: 0.39 kU/L — AB
Cladosporium Herbarum IgE: <0.1 kU/L
Cockroach, American IgE: <0.1 kU/L
Common Silver Birch IgE: 0.36 kU/L — AB
D Farinae IgE: <0.1 kU/L
D Pteronyssinus IgE: <0.1 kU/L
Dog Dander IgE: 0.3 kU/L — AB
Elm, American IgE: 1.84 kU/L — AB
Hickory, White IgE: 0.27 kU/L — AB
IgE (Immunoglobulin E), Serum: 312 IU/mL (ref 6–495)
Johnson Grass IgE: 0.29 kU/L — AB
Maple/Box Elder IgE: 0.54 kU/L — AB
Mucor Racemosus IgE: <0.1 kU/L
Mugwort IgE Qn: 2.62 kU/L — AB
Nettle IgE: 0.52 kU/L — AB
Oak, White IgE: 0.41 kU/L — AB
Penicillium Chrysogen IgE: <0.1 kU/L
Pigweed, Rough IgE: 1.65 kU/L — AB
Plantain, English IgE: 0.58 kU/L — AB
Ragweed, Short IgE: 0.31 kU/L — AB
Sheep Sorrel IgE Qn: 0.58 kU/L — AB
Stemphylium Herbarum IgE: <0.1 kU/L
Sweet gum IgE RAST Ql: 0.3 kU/L — AB
Timothy Grass IgE: 0.36 kU/L — AB
White Mulberry IgE: 0.52 kU/L — AB

## 2019-05-15 LAB — CBC WITH DIFFERENTIAL/PLATELET
Basophils Absolute: 0.1 10*3/uL (ref 0.0–0.2)
Basos: 1 %
EOS (ABSOLUTE): 0.2 10*3/uL (ref 0.0–0.4)
Eos: 2 %
Hematocrit: 38.9 % (ref 34.0–46.6)
Hemoglobin: 14 g/dL (ref 11.1–15.9)
Immature Grans (Abs): 0 10*3/uL (ref 0.0–0.1)
Immature Granulocytes: 0 %
Lymphocytes Absolute: 2.6 10*3/uL (ref 0.7–3.1)
Lymphs: 27 %
MCH: 33 pg (ref 26.6–33.0)
MCHC: 36 g/dL — ABNORMAL HIGH (ref 31.5–35.7)
MCV: 92 fL (ref 79–97)
Monocytes Absolute: 0.5 10*3/uL (ref 0.1–0.9)
Monocytes: 5 %
Neutrophils Absolute: 6.3 10*3/uL (ref 1.4–7.0)
Neutrophils: 65 %
Platelets: 427 10*3/uL (ref 150–450)
RBC: 4.24 x10E6/uL (ref 3.77–5.28)
RDW: 12.1 % (ref 11.7–15.4)
WBC: 9.7 10*3/uL (ref 3.4–10.8)

## 2019-05-15 LAB — ALLERGEN,OAT,F7: Allergen Oat IgE: 2.4 kU/L — AB

## 2019-05-15 LAB — ALLERGEN, WHEAT, F4: Wheat IgE: 5.34 kU/L — AB

## 2019-05-15 LAB — ALLERGEN, RYE, F5: Rye IgE: 5.06 kU/L — AB

## 2019-05-15 LAB — ALLERGEN BARLEY F6: Allergen Barley IgE: 6.55 kU/L — AB

## 2019-05-18 ENCOUNTER — Other Ambulatory Visit: Payer: Self-pay

## 2019-05-18 ENCOUNTER — Ambulatory Visit (INDEPENDENT_AMBULATORY_CARE_PROVIDER_SITE_OTHER): Payer: Medicare Other | Admitting: Psychiatry

## 2019-05-18 ENCOUNTER — Encounter (HOSPITAL_COMMUNITY): Payer: Self-pay | Admitting: Psychiatry

## 2019-05-18 DIAGNOSIS — F410 Panic disorder [episodic paroxysmal anxiety] without agoraphobia: Secondary | ICD-10-CM | POA: Diagnosis not present

## 2019-05-18 DIAGNOSIS — F411 Generalized anxiety disorder: Secondary | ICD-10-CM | POA: Diagnosis not present

## 2019-05-18 MED ORDER — EPINEPHRINE 0.3 MG/0.3ML IJ SOAJ: 0.3000 mg | Freq: Once | INTRAMUSCULAR | 1 refills | Status: AC

## 2019-05-18 NOTE — Patient Instructions (Signed)
1. Continue lexapro 20 mg daily 2.Continuemirtazapine 45mg  at night 3.Continue quetiapine50mg  at night  4. Next appointment: 5/18 at 3:20

## 2019-05-19 ENCOUNTER — Telehealth: Payer: Self-pay

## 2019-05-19 NOTE — Addendum Note (Signed)
Addended by: Valere Dross on: 05/19/2019 08:45 AM   Modules accepted: Orders

## 2019-05-19 NOTE — Telephone Encounter (Signed)
Please set up referral for sleep study for sleep apnea.

## 2019-05-20 NOTE — Telephone Encounter (Signed)
Referral placed to [facility] for scheduling. I will keep an eye on this referral.

## 2019-05-27 NOTE — Telephone Encounter (Signed)
06/09/2019    Time: 1:30 PM    Visit Type: SLEEP CONSULT [1170]    Provider: Star Age, MD

## 2019-06-01 ENCOUNTER — Ambulatory Visit: Payer: Medicare Other | Admitting: Nurse Practitioner

## 2019-06-03 ENCOUNTER — Ambulatory Visit: Payer: Medicare Other | Admitting: Cardiology

## 2019-06-07 ENCOUNTER — Other Ambulatory Visit: Payer: Self-pay

## 2019-06-07 ENCOUNTER — Encounter: Payer: Self-pay | Admitting: Nurse Practitioner

## 2019-06-07 ENCOUNTER — Ambulatory Visit (INDEPENDENT_AMBULATORY_CARE_PROVIDER_SITE_OTHER): Payer: Medicare Other | Admitting: Nurse Practitioner

## 2019-06-07 VITALS — BP 129/103 | HR 99 | Temp 96.8°F | Ht 64.0 in | Wt 169.6 lb

## 2019-06-07 DIAGNOSIS — K5900 Constipation, unspecified: Secondary | ICD-10-CM

## 2019-06-07 DIAGNOSIS — K219 Gastro-esophageal reflux disease without esophagitis: Secondary | ICD-10-CM

## 2019-06-07 NOTE — Patient Instructions (Signed)
Your health issues we discussed today were:   Constipation: 1. Continue taking your current medications 2. You can use MiraLAX intermittently, as needed 3. Call us if you have any worsening or severe symptoms  GERD (reflux/heartburn): 1. I am glad you are feeling better! 2. Continue taking your acid blocker 3. Call us if you have any worsening or severe symptoms  Overall I recommend:  1. Continue your other current medications 2. Follow-up with cardiology as scheduled 3. Return for follow-up in our office in 1 year 4. Call us if you have any questions or concerns   ---------------------------------------------------------------  If you choose to have a coronavirus vaccine after speaking with your primary care, [facility] has been doing multiple [facility] vaccine sites in the [place]. COVID-19 Vaccine Information can be found at: ShippingScam.co.uk For questions related to vaccine distribution or appointments, please email [email or web address] or call [phone].   ---------------------------------------------------------------   At [facility] we value your feedback. You may receive a survey about your visit today. Please share your experience as we strive to create trusting relationships with our patients to provide genuine, compassionate, quality care.  We appreciate your understanding and patience as we review any laboratory studies, imaging, and other diagnostic tests that are ordered as we care for you. Our office policy is 5 business days for review of these results, and any emergent or urgent results are addressed in a timely manner for your best interest. If you do not hear from our office in 1 week, please contact us.   We also encourage the use of MyChart, which contains your medical information for your review as well. If you are not enrolled in this feature, an access code is on this after visit  summary for your convenience. Thank you for allowing [place] to be involved in your care.  It was great to see you today!  I hope you have a great day!!

## 2019-06-07 NOTE — Progress Notes (Deleted)
  Primary Care Physician:  @PCP @ Primary Gastroenterologist:  Dr.   @CC @  Subjective:    Patient ID: Claire [name], female    DOB: 10/07/1970, 49 y.o.   MRN: [id number]  HPI: @NAME @ is a @AGE @ @SEX @ who presents      Review of Systems     Objective:   Physical Exam    @MEDICALHX @  @HXPSH @  @CMED @  @ALGENC @  @HXFAMILY @  @SOC @    Assessment & Plan:

## 2019-06-07 NOTE — Assessment & Plan Note (Signed)
Generally well managed on PPI.  Recommend she continue her current medications.  Follow-up in 1 year and call us if she has any worsening or severe symptoms.

## 2019-06-07 NOTE — Assessment & Plan Note (Signed)
Currently doing well on Linzess 290 mcg daily.  She did require a single dose of MiraLAX couple weeks ago, but otherwise very well controlled.  Recommend she continue her current medications and follow-up in 1 year.  Call for any worsening or severe symptoms.

## 2019-06-07 NOTE — Progress Notes (Signed)
Referring Provider: Wyatt Haste, NP Primary Care Physician:  Wyatt Haste, NP Primary GI:  Dr. Gala Romney  Chief Complaint  Patient presents with  . Gastroesophageal Reflux    f/u, doing ok   Claire [name] is a 49 y.o. female who presents for follow-up on constipation and GERD.  The patient was last seen in our office 11/19/2018 for the same.  EGD on file August 2018 with gray paper esophageal mucosa in longitudinal for is without tumor or Barrett's esophagus, antral erosions but no ulcers, 2 superficial tears in the midesophagus corresponding to an area of critical narrowing.  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  Stomach biopsies positive for H. pylori treated with Pylera and esophageal biopsy with squamous mucosa with marked increased epithelial eosinophils.  She was unable to complete H. pylori breath testing for eradication due to intolerance of PPI.  Previous constipation treated with Linzess 145 mcg without improvement that did somewhat improve after increasing to 290 mcg.  At her last visit noted no bowel movement in 2 days but not eating much recently otherwise regular bowel movements that are consistently [place] [facility].  Intermittent episodes of straining.  GERD with doing well on PPI.  No other overt GI complaints.  Recommended continue PPI therapy, continue Linzess to 90 mcg, add probiotic, MiraLAX as needed for "bad days".  Follow-up in 6 months.  Today she states she's doing well overall. Her constipation is doing well overall, having a bowel movement about once daily to twice daily, passes easily and consistent with [facility]. Did have an episode of constipation a couple weeks ago which resolved with a dose of MiraLAX. Denies recent breakthrough GERD on PPI. Has nausea as an ADE from a new medication. Denies abdominal pain, vomiting, hematochezia, melena, fever, chills, unintentional weight loss. Denies URI or flu-like symptoms. Denies loss of sense of taste or smell. Was tested for COVID-19  about 2 months ago which was negative. Has not had COVID-19 vaccine yet and is going to talk PCP. Denies chest pain, dyspnea, dizziness, lightheadedness, syncope, near syncope. Denies any other upper or lower GI symptoms.  Past Medical History:  Diagnosis Date  . Acid reflux   . Allergy    pollen, dust  . Anxiety   . Arthritis   . Asthma   . Carpal tunnel syndrome    bilateral  . DDD (degenerative disc disease), cervical   . Depression   . Fibromyalgia   . Hypertension   . Migraine   . Palpitations   . Panic attacks   . PTSD (post-traumatic stress disorder)   . Seizures ([place])    unknown etiology; last seizure was 2 years ago; on meds.  . Syncope and collapse   . Tennis elbow   . Ulcer   . Vertigo     Past Surgical History:  Procedure Laterality Date  . BACK SURGERY  1993  . BIOPSY  10/21/2016   Procedure: BIOPSY;  Surgeon: Daneil Dolin, MD;  Location: [facility];  Service: Endoscopy;;  gastric, esophagus  . CHOLECYSTECTOMY    . ESOPHAGOGASTRODUODENOSCOPY (EGD) WITH PROPOFOL N/A 10/21/2016   Procedure: ESOPHAGOGASTRODUODENOSCOPY (EGD) WITH PROPOFOL;  Surgeon: Daneil Dolin, MD;  Location: [facility];  Service: Endoscopy;  Laterality: N/A;  9:30am  . GALLBLADDER SURGERY    . SHOULDER SURGERY Right   . SPINE SURGERY  1993   lumbar disc surgery    Current Outpatient Medications  Medication Sig Dispense Refill  . albuterol (PROVENTIL  HFA;VENTOLIN HFA) 108 (90 Base) MCG/ACT inhaler Inhale 1-2 puffs into the lungs every 6 (six) hours as needed for wheezing or shortness of breath.     Marland Kitchen albuterol (PROVENTIL) (2.5 MG/3ML) 0.083% nebulizer solution Take 3 mLs (2.5 mg total) by nebulization every 4 (four) hours as needed for wheezing or shortness of breath. 75 mL 1  . albuterol (VENTOLIN HFA) 108 (90 Base) MCG/ACT inhaler Inhale 2 puffs into the lungs every 4 (four) hours as needed. 18 g 1  . budesonide-formoterol (SYMBICORT) 160-4.5 MCG/ACT inhaler Inhale 2 puffs into the  lungs 2 (two) times daily. 1 Inhaler 5  . busPIRone (BUSPAR) 10 MG tablet Take 1 tablet (10 mg total) by mouth 3 (three) times daily. 270 tablet 0  . Carbinoxamine Maleate 6 MG TABS Take 6 mg by mouth in the morning and at bedtime. 60 tablet 5  . diphenhydrAMINE (BENADRYL) 25 MG tablet Take 50 mg by mouth 3 (three) times daily.     Marland Kitchen escitalopram (LEXAPRO) 20 MG tablet Take 1 tablet (20 mg total) by mouth daily. 90 tablet 0  . Gabapentin, Once-Daily, (GRALISE PO) Take by mouth. gralise 638m 3 tablets at night    . ibuprofen (ADVIL,MOTRIN) 600 MG tablet Take 1 tablet (600 mg total) by mouth every 6 (six) hours as needed. 30 tablet 0  . linaclotide (LINZESS) 290 MCG CAPS capsule Take 1 capsule (290 mcg total) by mouth daily before breakfast. 90 capsule 3  . metoprolol succinate (TOPROL-XL) 25 MG 24 hr tablet Take 1 tablet (25 mg total) by mouth daily. 90 tablet 3  . mirtazapine (REMERON) 45 MG tablet Take 1 tablet (45 mg total) by mouth at bedtime. 90 tablet 0  . Multiple Vitamin (MULTIVITAMIN WITH MINERALS) TABS tablet Take 2 tablets by mouth daily.     .Marland KitchenNARCAN 4 MG/0.1ML LIQD nasal spray kit Place 1 spray into the nose as directed.    .Marland Kitchenomeprazole (PRILOSEC) 40 MG capsule Take 1 capsule (40 mg total) by mouth daily before breakfast. 90 capsule 3  . oxyCODONE-acetaminophen (PERCOCET) 10-325 MG tablet Take 1 tablet by mouth every 6 (six) hours as needed.    .Marland KitchenQUEtiapine (SEROQUEL) 50 MG tablet Take 1 tablet (50 mg total) by mouth at bedtime. 90 tablet 0  . tiZANidine (ZANAFLEX) 2 MG tablet Take 2 mg by mouth 3 (three) times daily.    .Marland KitchenHYDROcodone-acetaminophen (NORCO/VICODIN) 5-325 MG tablet Take 1 tablet by mouth every 4 (four) hours as needed. (Patient not taking: Reported on 06/07/2019) 10 tablet 0  . montelukast (SINGULAIR) 10 MG tablet Take 1 tablet (10 mg total) by mouth at bedtime. (Patient not taking: Reported on 06/07/2019) 30 tablet 5   No current facility-administered medications for  this visit.    Allergies as of 06/07/2019 - Review Complete 06/07/2019  Allergen Reaction Noted  . Baclofen Shortness Of Breath 10/10/2016  . Gluten meal  06/07/2019  . Claritin [loratadine] Other (See Comments) 07/28/2014  . Cymbalta [duloxetine hcl] Hives 07/29/2012  . Penicillins Hives 07/29/2012  . Sulfa antibiotics Hives 07/29/2012  . Ultram [tramadol] Other (See Comments) 09/12/2014    Family History  Problem Relation Age of Onset  . COPD Mother   . Bronchitis Mother   . Arthritis Mother   . Depression Mother   . Heart disease Mother   . Hypertension Mother   . Colon cancer Maternal Grandmother   . Alcohol abuse Father   . Early death Father  GSW  . PKU Son     Social History   Socioeconomic History  . Marital status: Single    Spouse name: Not on file  . Number of children: 1  . Years of education: HS  . Highest education level: Not on file  Occupational History  . Occupation: disability    Comment: unemployed  Tobacco Use  . Smoking status: Never Smoker  . Smokeless tobacco: Never Used  Substance and Sexual Activity  . Alcohol use: Not Currently    Comment: drink occasionally  . Drug use: No  . Sexual activity: Not Currently    Partners: Female    Birth control/protection: None  Other Topics Concern  . Not on file  Social History Narrative   Patient is left handed.   Patient drinks very little caffeine.   Lives alone   Social Determinants of Health   Financial Resource Strain:   . Difficulty of Paying Living Expenses:   Food Insecurity:   . Worried About Charity fundraiser in the Last Year:   . Arboriculturist in the Last Year:   Transportation Needs:   . Film/video editor (Medical):   Marland Kitchen Lack of Transportation (Non-Medical):   Physical Activity:   . Days of Exercise per Week:   . Minutes of Exercise per Session:   Stress:   . Feeling of Stress :   Social Connections:   . Frequency of Communication with Friends and Family:     . Frequency of Social Gatherings with Friends and Family:   . Attends Religious Services:   . Active Member of Clubs or Organizations:   . Attends Archivist Meetings:   Marland Kitchen Marital Status:      Review of Systems  Constitutional: Negative for chills, fever, malaise/fatigue and weight loss.  HENT: Negative for congestion and sore throat.   Respiratory: Positive for shortness of breath. Negative for cough.        Chronic dyspnea generally controlled with inhaler  Cardiovascular: Positive for chest pain and palpitations.       Intermittent chest discomfort and palpitation, has appt with cardiology upcoming.  Gastrointestinal: Positive for nausea. Negative for abdominal pain, blood in stool, diarrhea, melena and vomiting.  Skin: Negative for rash.  Neurological: Negative for dizziness and weakness.  Endo/Heme/Allergies: Does not bruise/bleed easily.  Psychiatric/Behavioral: Negative for depression and suicidal ideas. The patient is nervous/anxious.   All other systems reviewed and are negative.   BP (!) 129/103   Pulse 99   Temp (!) 96.8 F (36 C) (Temporal)   Ht _0  (1.626 m)   Wt 169 lb 9.6 oz (76.9 kg)   BMI 29.11 kg/m  Physical Exam Vitals and nursing note reviewed.  Constitutional:      Appearance: Normal appearance.     Comments: Overweight but not obese  HENT:     Head: Normocephalic and atraumatic.  Eyes:     General: No scleral icterus. Cardiovascular:     Rate and Rhythm: Normal rate and regular rhythm.     Heart sounds: Normal heart sounds.  Pulmonary:     Effort: Pulmonary effort is normal. No respiratory distress.     Breath sounds: Normal breath sounds. No wheezing.  Abdominal:     General: Bowel sounds are normal. There is no distension.     Palpations: Abdomen is soft. There is no mass.     Tenderness: There is no abdominal tenderness. There is no guarding.  Hernia: No hernia is present.  Musculoskeletal:     Comments: Ambulates with a  cane  Skin:    General: Skin is warm and dry.     Coloration: Skin is not jaundiced.     Findings: No bruising or rash.  Neurological:     General: No focal deficit present.     Mental Status: She is alert and oriented to person, place, and time. Mental status is at baseline.  Psychiatric:        Mood and Affect: Mood and affect normal.        Speech: Speech normal.        Behavior: Behavior normal.        Thought Content: Thought content normal.        Cognition and Memory: Cognition and memory normal.        06/07/2019 2:14 PM   Disclaimer: This note was dictated with voice recognition software. Similar sounding words can inadvertently be transcribed and may not be corrected upon review.

## 2019-06-09 ENCOUNTER — Encounter: Payer: Self-pay | Admitting: Neurology

## 2019-06-09 ENCOUNTER — Ambulatory Visit (INDEPENDENT_AMBULATORY_CARE_PROVIDER_SITE_OTHER): Payer: Medicare Other | Admitting: Neurology

## 2019-06-09 ENCOUNTER — Other Ambulatory Visit: Payer: Self-pay

## 2019-06-09 VITALS — BP 115/90 | HR 97 | Temp 96.6°F | Ht 64.0 in | Wt 168.1 lb

## 2019-06-09 DIAGNOSIS — R0683 Snoring: Secondary | ICD-10-CM

## 2019-06-09 DIAGNOSIS — R351 Nocturia: Secondary | ICD-10-CM | POA: Diagnosis not present

## 2019-06-09 DIAGNOSIS — R0681 Apnea, not elsewhere classified: Secondary | ICD-10-CM

## 2019-06-09 DIAGNOSIS — E663 Overweight: Secondary | ICD-10-CM | POA: Diagnosis not present

## 2019-06-09 NOTE — Progress Notes (Deleted)
   Subjective:    Patient ID: Claire [name], female    DOB: 1970/08/31, 49 y.o.   MRN: [id number]  HPI    Review of Systems  Constitutional: Positive for fatigue.       Weight gain  HENT: Positive for trouble swallowing.        Ringing in ears Spinning sensation  Eyes: Positive for visual disturbance.  Respiratory: Positive for shortness of breath and wheezing.   Cardiovascular: Positive for chest pain.  Endocrine: Positive for cold intolerance and heat intolerance.       Thirst   Musculoskeletal: Positive for back pain and gait problem.       Chronic pain  Skin:       Easily bruise  Neurological: Positive for dizziness, tremors, seizures, syncope, weakness, numbness and headaches.       Snoring  Memory loss   Psychiatric/Behavioral: Positive for decreased concentration and sleep disturbance.       Depression Anxiety Decrease energy Change in appetite Racing thoughts        Objective:   Physical Exam        Assessment & Plan:

## 2019-06-09 NOTE — Patient Instructions (Signed)
  Based on your symptoms and your exam I believe you are at risk for obstructive sleep apnea (aka OSA), and I think we should proceed with a sleep study to determine whether you do or do not have OSA and how severe it is. Even, if you have mild OSA, I may want you to consider treatment with CPAP, as treatment of even borderline or mild sleep apnea can result and improvement of symptoms such as sleep disruption, daytime sleepiness, nighttime bathroom breaks, restless leg symptoms, improvement of headache syndromes, even improved mood disorder.   Please remember, the long-term risks and ramifications of untreated moderate to severe obstructive sleep apnea are: increased Cardiovascular disease, including congestive heart failure, stroke, difficult to control hypertension, treatment resistant obesity, arrhythmias, especially irregular heartbeat commonly known as A. Fib. (atrial fibrillation); even type 2 diabetes has been linked to untreated OSA.   Sleep apnea can cause disruption of sleep and sleep deprivation in most cases, which, in turn, can cause recurrent headaches, problems with memory, mood, concentration, focus, and vigilance. Most people with untreated sleep apnea report excessive daytime sleepiness, which can affect their ability to drive. Please do not drive if you feel sleepy. Patients with sleep apnea developed difficulty initiating and maintaining sleep (aka insomnia).   Having sleep apnea may increase your risk for other sleep disorders, including involuntary behaviors sleep such as sleep terrors, sleep talking, sleepwalking.    Having sleep apnea can also increase your risk for restless leg syndrome and leg movements at night.   Please note that untreated obstructive sleep apnea may carry additional perioperative morbidity. Patients with significant obstructive sleep apnea (typically, in the moderate to severe degree) should receive, if possible, perioperative PAP (positive airway pressure)  therapy and the surgeons and particularly the anesthesiologists should be informed of the diagnosis and the severity of the sleep disordered breathing.   I will likely see you back after your sleep study to go over the test results and where to go from there. We will call you after your sleep study to advise about the results (most likely, you will hear from Kristen, my nurse) and to set up an appointment at the time, as necessary.    Our sleep lab administrative assistant will call you to schedule your sleep study and give you further instructions, regarding the check in process for the sleep study, arrival time, what to bring, when you can expect to leave after the study, etc., and to answer any other logistical questions you may have. If you don't hear back from her by about 2 weeks from now, please feel free to call her direct line at [phone] or you can call our general clinic number, or email us through My Chart.   

## 2019-06-09 NOTE — Progress Notes (Signed)
Subjective:    Patient ID: Claire [name], female    DOB: 1970/10/05, 49 y.o.   MRN: [id number]  HPI    Star Age, MD, PhD [facility] [address], [address] [place], [place] [address]  Dear Dr. Nelva Bush,  I saw your patient, Claire [name], upon your kind request in the sleep clinic today for initial consultation of her sleep disorder, in particular, concern for underlying obstructive sleep apnea.  The patient is unaccompanied today.  As you know, [name] is a 49 year old right-handed woman with an underlying medical history of asthma, hypertension, migraine, fibromyalgia and blackout spells, suspected seizures on Keppra (for which she has seen Dr. Jannifer Franklin), allergies, acid reflux, degenerative disc disease, depression, anxiety, history of syncope, PTSD by chart review, palpitations, vertigo, and overweight state, who reports snoring and excessive daytime somnolence.  I reviewed the office note from 05/13/2019.  Her Epworth sleepiness score is 0/24, Fatigue severity score is 62 out of 63.  She reports trouble going to sleep and staying asleep.  She has tried melatonin over-the-counter which did not help.  She does snore.  She is single and lives alone but has been told by a cousin that she had pauses in her breathing when she was witnessed to sleep on the couch one time.  The patient denies any recurrent morning headaches but does have recurrent headaches she reports.  She has not made a follow-up appointment with Dr. Jannifer Franklin but would like to make one on her way out today.  She reports increase in stress.  She reports not being able to stay asleep.  She has no obvious family history of sleep apnea as she recalls.  She does not have a TV in her bedroom but sometimes does look at her phone at night.  She has a bedtime between 11 and midnight and rise time generally between 6 and 7.  She is a non-smoker and does not drink alcohol.  She drinks very little caffeine,  approximately 1 soda per week.  Her Past Medical History Is Significant For: Past Medical History:  Diagnosis Date  . Acid reflux   . Allergy    pollen, dust  . Anxiety   . Arthritis   . Asthma   . Carpal tunnel syndrome    bilateral  . DDD (degenerative disc disease), cervical   . Depression   . Fibromyalgia   . Hypertension   . Migraine   . Palpitations   . Panic attacks   . PTSD (post-traumatic stress disorder)   . Seizures ([place])    unknown etiology; last seizure was 2 years ago; on meds.  . Syncope and collapse   . Tennis elbow   . Ulcer   . Vertigo     Her Past Surgical History Is Significant For: Past Surgical History:  Procedure Laterality Date  . BACK SURGERY  1993  . BIOPSY  10/21/2016   Procedure: BIOPSY;  Surgeon: Daneil Dolin, MD;  Location: [facility];  Service: Endoscopy;;  gastric, esophagus  . CHOLECYSTECTOMY    . ESOPHAGOGASTRODUODENOSCOPY (EGD) WITH PROPOFOL N/A 10/21/2016   Procedure: ESOPHAGOGASTRODUODENOSCOPY (EGD) WITH PROPOFOL;  Surgeon: Daneil Dolin, MD;  Location: [facility];  Service: Endoscopy;  Laterality: N/A;  9:30am  . GALLBLADDER SURGERY    . SHOULDER SURGERY Right   . SPINE SURGERY  1993   lumbar disc surgery    Her Family History Is Significant For: Family History  Problem Relation Age  of Onset  . COPD Mother   . Bronchitis Mother   . Arthritis Mother   . Depression Mother   . Heart disease Mother   . Hypertension Mother   . Colon cancer Maternal Grandmother   . Alcohol abuse Father   . Early death Father        GSW  . PKU Son     Her Social History Is Significant For: Social History   Socioeconomic History  . Marital status: Single    Spouse name: Not on file  . Number of children: 1  . Years of education: HS  . Highest education level: Not on file  Occupational History  . Occupation: disability    Comment: unemployed  Tobacco Use  . Smoking status: Never Smoker  . Smokeless tobacco: Never Used    Substance and Sexual Activity  . Alcohol use: Not Currently    Comment: drink occasionally  . Drug use: No  . Sexual activity: Not Currently    Partners: Female    Birth control/protection: None  Other Topics Concern  . Not on file  Social History Narrative   Patient is left handed.   Patient drinks very little caffeine.   Lives alone   Social Determinants of Health   Financial Resource Strain:   . Difficulty of Paying Living Expenses:   Food Insecurity:   . Worried About Charity fundraiser in the Last Year:   . Arboriculturist in the Last Year:   Transportation Needs:   . Film/video editor (Medical):   Marland Kitchen Lack of Transportation (Non-Medical):   Physical Activity:   . Days of Exercise per Week:   . Minutes of Exercise per Session:   Stress:   . Feeling of Stress :   Social Connections:   . Frequency of Communication with Friends and Family:   . Frequency of Social Gatherings with Friends and Family:   . Attends Religious Services:   . Active Member of Clubs or Organizations:   . Attends Archivist Meetings:   Marland Kitchen Marital Status:     Her Allergies Are:  Allergies  Allergen Reactions  . Baclofen Shortness Of Breath    Swollen ankles  . Gluten Meal   . Claritin [Loratadine] Other (See Comments)    shaking  . Cymbalta [Duloxetine Hcl] Hives    "Hives, forgot who I was"  . Penicillins Hives    Has patient had a PCN reaction causing immediate rash, facial/tongue/throat swelling, SOB or lightheadedness with hypotension: Unknown Has patient had a PCN reaction causing severe rash involving mucus membranes or skin necrosis: Yes Has patient had a PCN reaction that required hospitalization: Unknown Has patient had a PCN reaction occurring within the last 10 years: Unknown If all of the above answers are "NO", then may proceed with Cephalosporin use.  . Sulfa Antibiotics Hives  . Ultram [Tramadol] Other (See Comments)    Dizzy  :   Her Current Medications  Are:  Outpatient Encounter Medications as of 06/09/2019  Medication Sig  . albuterol (PROVENTIL HFA;VENTOLIN HFA) 108 (90 Base) MCG/ACT inhaler Inhale 1-2 puffs into the lungs every 6 (six) hours as needed for wheezing or shortness of breath.   Marland Kitchen albuterol (PROVENTIL) (2.5 MG/3ML) 0.083% nebulizer solution Take 3 mLs (2.5 mg total) by nebulization every 4 (four) hours as needed for wheezing or shortness of breath.  . budesonide-formoterol (SYMBICORT) 160-4.5 MCG/ACT inhaler Inhale 2 puffs into the lungs 2 (two) times daily.  Marland Kitchen  busPIRone (BUSPAR) 10 MG tablet Take 1 tablet (10 mg total) by mouth 3 (three) times daily.  . Carbinoxamine Maleate 6 MG TABS Take 6 mg by mouth in the morning and at bedtime.  . diphenhydrAMINE (BENADRYL) 25 MG tablet Take 50 mg by mouth as needed.   Marland Kitchen escitalopram (LEXAPRO) 20 MG tablet Take 1 tablet (20 mg total) by mouth daily.  . Gabapentin, Once-Daily, (GRALISE PO) Take by mouth. gralise '600mg'$  3 tablets at night  . ibuprofen (ADVIL,MOTRIN) 600 MG tablet Take 1 tablet (600 mg total) by mouth every 6 (six) hours as needed.  Marland Kitchen ipratropium-albuterol (DUONEB) 0.5-2.5 (3) MG/3ML SOLN   . linaclotide (LINZESS) 290 MCG CAPS capsule Take 1 capsule (290 mcg total) by mouth daily before breakfast.  . metoprolol succinate (TOPROL-XL) 25 MG 24 hr tablet Take 1 tablet (25 mg total) by mouth daily.  . mirtazapine (REMERON) 45 MG tablet Take 1 tablet (45 mg total) by mouth at bedtime.  . Multiple Vitamin (MULTIVITAMIN WITH MINERALS) TABS tablet Take 2 tablets by mouth daily.   Marland Kitchen NARCAN 4 MG/0.1ML LIQD nasal spray kit Place 1 spray into the nose as directed.  Marland Kitchen omeprazole (PRILOSEC) 40 MG capsule Take 1 capsule (40 mg total) by mouth daily before breakfast.  . oxyCODONE-acetaminophen (PERCOCET) 10-325 MG tablet Take 1 tablet by mouth every 6 (six) hours as needed.  Marland Kitchen QUEtiapine (SEROQUEL) 50 MG tablet Take 1 tablet (50 mg total) by mouth at bedtime.  Marland Kitchen SAVELLA 50 MG TABS tablet Take  50 mg by mouth 2 (two) times daily.  Marland Kitchen telmisartan (MICARDIS) 80 MG tablet Take 80 mg by mouth daily.  Marland Kitchen tiZANidine (ZANAFLEX) 2 MG tablet Take 2 mg by mouth 3 (three) times daily.  . [DISCONTINUED] albuterol (VENTOLIN HFA) 108 (90 Base) MCG/ACT inhaler Inhale 2 puffs into the lungs every 4 (four) hours as needed. (Patient not taking: Reported on 06/09/2019)  . [DISCONTINUED] GRALISE 600 MG TABS SMARTSIG:3 Tablet(s) By Mouth Every Night   No facility-administered encounter medications on file as of 06/09/2019.  :  Review of Systems:  Out of a complete 14 point review of systems, all are reviewed and negative with the exception of these symptoms as listed below:  Review of Systems  Constitutional: Positive for fatigue and unexpected weight change.  HENT: Positive for trouble swallowing.        Ringing in ears Spinning sensation   Eyes: Positive for visual disturbance.       Blurred vision  Respiratory: Positive for shortness of breath and wheezing.   Cardiovascular: Positive for chest pain.  Endocrine: Positive for cold intolerance and heat intolerance.  Genitourinary: Negative.   Musculoskeletal: Positive for back pain and gait problem.       Cramps Aching muscles  Neurological: Positive for dizziness, tremors, seizures, syncope, speech difficulty, weakness, numbness and headaches.       Epworth Sleepiness Scale 0= would never doze 1= slight chance of dozing 2= moderate chance of dozing 3= high chance of dozing  Sitting and reading:0 Watching TV:0 Sitting inactive in a public place (ex. Theater or meeting):0 As a passenger in a car for an hour without a break:0 Lying down to rest in the afternoon:0 Sitting and talking to someone:0 Sitting quietly after lunch (no alcohol):0 In a car, while stopped in traffic:0 Total:0  Psychiatric/Behavioral: Positive for agitation.       Depesssion Anxiety Decrease energy Change in appetite Disinterest in activities Racing thoughts  Objective:   Physical Exam        Physical Examination:   Vitals:   06/09/19 1330  BP: 115/90  Pulse: 97  Temp: (!) 96.6 F (35.9 C)    General Examination: The patient is a very pleasant 49 y.o. female in no acute distress. She appears well-developed and well-nourished and well groomed.   HEENT: Normocephalic, atraumatic, pupils are equal, round and reactive to light, extraocular tracking is good without limitation to gaze excursion or nystagmus noted. Hearing is grossly intact. Face is symmetric with normal facial animation. Speech is clear with no dysarthria noted. There is no hypophonia. There is no lip, neck/head, jaw or voice tremor. Neck is supple with full range of passive and active motion. There are no carotid bruits on auscultation. Oropharynx exam reveals: moderate mouth dryness, adequate dental hygiene and mild airway crowding, due to smaller airway entry, tonsils are small, Mallampati class II, tongue protrudes centrally in palate elevates symmetrically, she has a minimal overbite, neck circumference is 14 inches.  Chest: Clear to auscultation without wheezing, rhonchi or crackles noted.  Heart: S1+S2+0, regular and normal without murmurs, rubs or gallops noted.   Abdomen: Soft, non-tender and non-distended with normal bowel sounds appreciated on auscultation.  Extremities: There is no pitting edema in the distal lower extremities bilaterally.   Skin: Warm and dry without trophic changes noted.   Musculoskeletal: exam reveals no obvious joint deformities, tenderness or joint swelling or erythema.   Neurologically:  Mental status: The patient is awake, alert and oriented in all 4 spheres. Her immediate and remote memory, attention, language skills and fund of knowledge are appropriate. There is no evidence of aphasia, agnosia, apraxia or anomia. Speech is clear with normal prosody and enunciation. Thought process is linear. Mood is normal and affect is normal.   Cranial nerves II - XII are as described above under HEENT exam.  Motor exam: Normal bulk, strength and tone is noted. There is no tremor, Romberg is negative. Reflexes are 2+ throughout. Fine motor skills and coordination: grossly intact.  Cerebellar testing: No dysmetria or intention tremor. There is no truncal or gait ataxia.  Sensory exam: intact to light touch in the upper and lower extremities.  Gait, station and balance: She stands with mild difficulty and she walks with a single-point walking stick held on the left side.   Assessment & Plan:  In summary, AYSHA LIVECCHI is a very pleasant 49 y.o.-year old female with an underlying medical history of asthma, hypertension, migraine, fibromyalgia and blackout spells, suspected seizures on Keppra (for which she has seen Dr. Jannifer Franklin), allergies, acid reflux, degenerative disc disease, depression, anxiety, history of syncope, PTSD by chart review, palpitations, vertigo, and overweight state, whose history and physical exam are concerning for obstructive sleep apnea (OSA). I had a long chat with the patient about my findings and the diagnosis of OSA, its prognosis and treatment options. We talked about medical treatments, surgical interventions and non-pharmacological approaches. I explained in particular the risks and ramifications of untreated moderate to severe OSA, especially with respect to developing cardiovascular disease down the Road, including congestive heart failure, difficult to treat hypertension, cardiac arrhythmias, or stroke. Even type 2 diabetes has, in part, been linked to untreated OSA. Symptoms of untreated OSA include daytime sleepiness, memory problems, mood irritability and mood disorder such as depression and anxiety, lack of energy, as well as recurrent headaches, especially morning headaches. We talked about trying to maintain a healthy lifestyle in general, as well  as the importance of weight control. We also talked about the  importance of good sleep hygiene. I recommended the following at this time: sleep study.   I explained the sleep test procedure to the patient and also outlined possible surgical and non-surgical treatment options of OSA, including the use of a custom-made dental device (which would require a referral to a specialist dentist or oral surgeon), upper airway surgical options, such as traditional UPPP or a novel less invasive surgical option in the form of Inspire hypoglossal nerve stimulation (which would involve a referral to an ENT surgeon).  Of note, she tried over-the-counter mouth guards for tooth grinding and could not tolerate them.  She does not believe that she would be able to tolerate a mouth appliance for sleep apnea treatment. I also explained the CPAP treatment option to the patient, who indicated that she would be willing to try CPAP if the need arises. I explained the importance of being compliant with PAP treatment, not only for insurance purposes but primarily to improve Her symptoms, and for the patient's long term health benefit, including to reduce Her cardiovascular risks. I answered all her questions today and the patient was in agreement. I plan to see her back after the sleep study is completed and encouraged her to call with any interim questions, concerns, problems or updates.  She wanted to schedule a follow-up appointment with Dr. Jannifer Franklin, she was encouraged to schedule this appointment on her way out today. Thank you very much for allowing me to participate in the care of this nice patient. If I can be of any further assistance to you please do not hesitate to call me at [phone].  Sincerely,   Star Age, MD, PhD

## 2019-06-11 ENCOUNTER — Encounter: Payer: Self-pay | Admitting: Allergy

## 2019-06-11 ENCOUNTER — Telehealth: Payer: Self-pay | Admitting: *Deleted

## 2019-06-11 ENCOUNTER — Other Ambulatory Visit: Payer: Self-pay

## 2019-06-11 ENCOUNTER — Ambulatory Visit (INDEPENDENT_AMBULATORY_CARE_PROVIDER_SITE_OTHER): Payer: Medicare Other | Admitting: Allergy

## 2019-06-11 VITALS — BP 94/60 | HR 95 | Temp 97.6°F | Resp 18 | Ht 64.0 in

## 2019-06-11 DIAGNOSIS — J4550 Severe persistent asthma, uncomplicated: Secondary | ICD-10-CM

## 2019-06-11 DIAGNOSIS — T50905D Adverse effect of unspecified drugs, medicaments and biological substances, subsequent encounter: Secondary | ICD-10-CM

## 2019-06-11 DIAGNOSIS — J3089 Other allergic rhinitis: Secondary | ICD-10-CM | POA: Diagnosis not present

## 2019-06-11 DIAGNOSIS — T7800XA Anaphylactic reaction due to unspecified food, initial encounter: Secondary | ICD-10-CM | POA: Diagnosis not present

## 2019-06-11 MED ORDER — DUPILUMAB 200 MG/1.14ML ~~LOC~~ SOSY
400.0000 mg | PREFILLED_SYRINGE | Freq: Once | SUBCUTANEOUS | Status: AC
  Administered 2021-07-04: 400 mg via SUBCUTANEOUS

## 2019-06-11 NOTE — Telephone Encounter (Signed)
Patient received 400mg  loading dose of Dupixent today for Asthma. Per Dr. Nelva Bush she would like for the patient to receive 200mg  of Dupixent every 2 weeks.

## 2019-06-11 NOTE — Progress Notes (Signed)
Follow-up Note  RE: Claire [name] MRN: [id number] DOB: October 08, 1970 Date of Office Visit: 06/11/2019   History of present illness: Claire [name] is a 49 y.o. female presenting today for follow-up of severe persistent asthma, rhinitis Food allergy and drug allergy.  She was last seen in the office on 05/13/2019.  After this visit recommend she have a chest x-ray done that was abnormal however did show an old fracture.  I did start her on Symbicort 160 mcg up from the 80 dose.  She does state that she can tell a difference between the increase in dose.  However she still states that she is needing to use her rescue medications at least several times a week.  She states she would likely use it more if she had a nebulizer machine that works.  She states she got a nebulizer machine from her mother however this machine broke.  That she does not have a nebulizer machine at this time.  She also was advised to start on Singulair.  However she states that her Medicare was not covering one of the medications that we prescribed either of the Singulair or the carbinoxamine.  She is not sure which one. She states with tree pollen season she is having more allergy symptoms as well including nasal congestion and drainage.  She states she needs to be on a antihistamine to help with her to control. She has been doing her best to avoid gluten based products in her diet. A sleep study has been ordered as she reports she has been told she stops breathing in her sleep by a family member.    Review of systems: Review of Systems  Constitutional: Negative.   HENT: Positive for congestion.   Eyes: Negative.   Respiratory: Positive for cough, shortness of breath and wheezing.   Cardiovascular: Negative.   Gastrointestinal: Negative.   Musculoskeletal: Negative.   Skin: Negative.   Neurological: Negative.     All other systems negative unless noted above in HPI  Past medical/social/surgical/family history have been  reviewed and are unchanged unless specifically indicated below.  No changes  Medication List: Current Outpatient Medications  Medication Sig Dispense Refill  . albuterol (PROVENTIL HFA;VENTOLIN HFA) 108 (90 Base) MCG/ACT inhaler Inhale 1-2 puffs into the lungs every 6 (six) hours as needed for wheezing or shortness of breath.     Marland Kitchen albuterol (PROVENTIL) (2.5 MG/3ML) 0.083% nebulizer solution Take 3 mLs (2.5 mg total) by nebulization every 4 (four) hours as needed for wheezing or shortness of breath. 75 mL 1  . budesonide-formoterol (SYMBICORT) 160-4.5 MCG/ACT inhaler Inhale 2 puffs into the lungs 2 (two) times daily. 1 Inhaler 5  . busPIRone (BUSPAR) 10 MG tablet Take 1 tablet (10 mg total) by mouth 3 (three) times daily. 270 tablet 0  . diphenhydrAMINE (BENADRYL) 25 MG tablet Take 50 mg by mouth as needed.     Marland Kitchen escitalopram (LEXAPRO) 20 MG tablet Take 1 tablet (20 mg total) by mouth daily. 90 tablet 0  . Gabapentin, Once-Daily, (GRALISE PO) Take by mouth. gralise '600mg'$  3 tablets at night    . ibuprofen (ADVIL,MOTRIN) 600 MG tablet Take 1 tablet (600 mg total) by mouth every 6 (six) hours as needed. 30 tablet 0  . ipratropium-albuterol (DUONEB) 0.5-2.5 (3) MG/3ML SOLN     . linaclotide (LINZESS) 290 MCG CAPS capsule Take 1 capsule (290 mcg total) by mouth daily before breakfast. 90 capsule 3  . metoprolol succinate (TOPROL-XL) 25 MG 24  hr tablet Take 1 tablet (25 mg total) by mouth daily. 90 tablet 3  . mirtazapine (REMERON) 45 MG tablet Take 1 tablet (45 mg total) by mouth at bedtime. 90 tablet 0  . Multiple Vitamin (MULTIVITAMIN WITH MINERALS) TABS tablet Take 2 tablets by mouth daily.     Marland Kitchen NARCAN 4 MG/0.1ML LIQD nasal spray kit Place 1 spray into the nose as directed.    Marland Kitchen omeprazole (PRILOSEC) 40 MG capsule Take 1 capsule (40 mg total) by mouth daily before breakfast. 90 capsule 3  . oxyCODONE-acetaminophen (PERCOCET) 10-325 MG tablet Take 1 tablet by mouth every 6 (six) hours as needed.     Marland Kitchen QUEtiapine (SEROQUEL) 50 MG tablet Take 1 tablet (50 mg total) by mouth at bedtime. 90 tablet 0  . SAVELLA 50 MG TABS tablet Take 50 mg by mouth 2 (two) times daily.    Marland Kitchen telmisartan (MICARDIS) 80 MG tablet Take 80 mg by mouth daily.    Marland Kitchen tiZANidine (ZANAFLEX) 2 MG tablet Take 2 mg by mouth 3 (three) times daily.     Current Facility-Administered Medications  Medication Dose Route Frequency Provider Last Rate Last Admin  . dupilumab (DUPIXENT) prefilled syringe 400 mg  400 mg Subcutaneous Once Kennith Gain, MD         Known medication allergies: Allergies  Allergen Reactions  . Baclofen Shortness Of Breath    Swollen ankles  . Gluten Meal   . Claritin [Loratadine] Other (See Comments)    shaking  . Cymbalta [Duloxetine Hcl] Hives    "Hives, forgot who I was"  . Penicillins Hives    Has patient had a PCN reaction causing immediate rash, facial/tongue/throat swelling, SOB or lightheadedness with hypotension: Unknown Has patient had a PCN reaction causing severe rash involving mucus membranes or skin necrosis: Yes Has patient had a PCN reaction that required hospitalization: Unknown Has patient had a PCN reaction occurring within the last 10 years: Unknown If all of the above answers are "NO", then may proceed with Cephalosporin use.  . Sulfa Antibiotics Hives  . Ultram [Tramadol] Other (See Comments)    Dizzy     Physical examination: Blood pressure 94/60, pulse 95, temperature 97.6 F (36.4 C), temperature source Temporal, resp. rate 18, height '5\' 4"'$  (1.626 m), SpO2 98 %.  General: Alert, interactive, in no acute distress. HEENT: PERRLA, TMs pearly gray, turbinates minimally edematous without discharge, post-pharynx non erythematous. Neck: Supple without lymphadenopathy. Lungs: Clear to auscultation without wheezing, rhonchi or rales. {no increased work of breathing. CV: Normal S1, S2 without murmurs. Abdomen: Nondistended, nontender. Skin: Warm and dry,  without lesions or rashes. Extremities:  No clubbing, cyanosis or edema. Neuro:   Grossly intact.  Diagnositics/Labs: Labs/imaging:  CXR from 05/13/19 personally reviewed and is normal with no cardiopulmonary disease  Spirometry: FEV1: 2.82 L 100%, FVC: 3.45 L 97%, ratio consistent with nonobstructive pattern  Assessment and plan: Severe persistent asthma Rhinitis, allergic Anaphylaxis due to food Adverse drug effect    - lung function testing looks good today   - control is improving but not yet under good control  - CXR was normal   - continue Symbicort 166mg 2 puffs twice a day with spacer  - continue Singulair '10mg'$  daily at bedtime.   - have access to albuterol inhaler 2 puffs every 4-6 hours as needed for cough/wheeze/shortness of breath/chest tightness.  May use 15-20 minutes prior to activity.   Monitor frequency of use.    - CBC did show  eosinophil count of 200.  This qualifies you for a biologic asthma medication to step-up therapy and provide improved asthma control.   Discussed the biologic options today and decision made to proceed with Dupixent therapy.  Dupixent benefits and risks discussed today.  Sample provided.  Continue Dupixent 1 injection every 2 weeks at home use.  Tammy, our biologic nurse coordinator, will call you regarding your supply and approval.    Asthma control goals:   Full participation in all desired activities (may need albuterol before activity)  Albuterol use two time or less a week on average (not counting use with activity)  Cough interfering with sleep two time or less a month  Oral steroids no more than once a year  No hospitalizations   - environmental allergy panel was positive to high IgE to cat dander, tree pollen, weed pollen; moderate IgE to grass pollen low IgE to dog dander.   - provided with samples of Ryvent today take 1 pill twice a day.  This is an antihistamine similar to benadryl but is longer acting than benadryl.  This is a  prescription based medication.     - IgE levels were positive to wheat, rye, barley, oat (gluten products)   - continue avoidance of the above foods  - have access to self-injectable epinephrine Epipen 0.'3mg'$  at all times  - follow emergency action plan in case of allergic reaction   - continue to avoid loratadine (claritin).    Follow-up 3-4 months or sooner if needed  I appreciate the opportunity to take part in Claire [name] care. Please do not hesitate to contact me with questions.  Sincerely,   Prudy Feeler, MD Allergy/Immunology [facility]

## 2019-06-11 NOTE — Progress Notes (Signed)
Immunotherapy   Patient Details  Name: Claire [name] MRN: [id number] Date of Birth: Apr 15, 1970  06/11/2019  [name] started injections for Dupixent 400mg /1.14 given into the office one injection given by myself by providing teaching to patient who will be giving injections by herself at her home. The other injection was given by the patient with proper training and teaching and techniques.  Patient waited her 30 mins in the office and did well with her injections. No complaints of any discomfort or pain and tolerated injection well. Patient was instructed to keep a journal of her injections to keep records.  Following schedule: 200mg /1.14 mL given every 2 weeks   Epi-Pen:Epi-Pen Available  Consent signed and patient instructions given.   [name] 06/11/2019, 12:15 PM

## 2019-06-11 NOTE — Patient Instructions (Addendum)
-   lung function testing looks good today   - control is improving  - CXR was normal   - continue Symbicort 166mcg 2 puffs twice a day with spacer  - continue Singulair 10mg  daily at bedtime.   - have access to albuterol inhaler 2 puffs every 4-6 hours as needed for cough/wheeze/shortness of breath/chest tightness.  May use 15-20 minutes prior to activity.   Monitor frequency of use.    - CBC did show eosinophil count of 200.  This qualifies you for a biologic asthma medication to step-up therapy and provide improved asthma control.   Discussed the biologic options today and decision made to proceed with Dupixent therapy.  Dupixent benefits and risks discussed today.  Sample provided.  Continue Dupixent 1 injection every 2 weeks at home use.  Tammy, our biologic nurse coordinator, will call you regarding your supply and approval.    Asthma control goals:   Full participation in all desired activities (may need albuterol before activity)  Albuterol use two time or less a week on average (not counting use with activity)  Cough interfering with sleep two time or less a month  Oral steroids no more than once a year  No hospitalizations   - environmental allergy panel was positive to high IgE to cat dander, tree pollen, weed pollen; moderate IgE to grass pollen low IgE to dog dander.   - provided with samples of Ryvent today take 1 pill twice a day.  This is an antihistamine similar to benadryl but is longer acting than benadryl.  This is a prescription based medication.     - IgE levels were positive to wheat, rye, barley, oat (gluten products)   - continue avoidance of the above foods  - have access to self-injectable epinephrine Epipen 0.3mg  at all times  - follow emergency action plan in case of allergic reaction   - continue to avoid loratadine (claritin).    Follow-up 3-4 months or sooner if needed

## 2019-06-15 NOTE — Telephone Encounter (Signed)
Spoke to patient and will mail app

## 2019-06-15 NOTE — Telephone Encounter (Signed)
L/m for patient to contact me to discuss affordability and getting Rx through [facility] patient assistance since she has [facility]

## 2019-06-21 ENCOUNTER — Other Ambulatory Visit: Payer: Self-pay | Admitting: Physical Medicine and Rehabilitation

## 2019-06-21 ENCOUNTER — Ambulatory Visit
Admission: RE | Admit: 2019-06-21 | Discharge: 2019-06-21 | Disposition: A | Discharge status: Home / Self Care | Payer: Medicare Other | Source: Ambulatory Visit | Attending: Physical Medicine and Rehabilitation | Admitting: Physical Medicine and Rehabilitation

## 2019-06-21 DIAGNOSIS — M25511 Pain in right shoulder: Secondary | ICD-10-CM

## 2019-06-22 ENCOUNTER — Ambulatory Visit (HOSPITAL_COMMUNITY): Payer: Medicare Other | Admitting: Psychiatry

## 2019-06-23 ENCOUNTER — Other Ambulatory Visit: Payer: Self-pay

## 2019-06-23 ENCOUNTER — Ambulatory Visit (INDEPENDENT_AMBULATORY_CARE_PROVIDER_SITE_OTHER): Payer: Medicare Other | Admitting: Cardiovascular Disease

## 2019-06-23 ENCOUNTER — Encounter: Payer: Self-pay | Admitting: Cardiovascular Disease

## 2019-06-23 ENCOUNTER — Telehealth: Payer: Self-pay | Admitting: Cardiovascular Disease

## 2019-06-23 VITALS — BP 90/64 | HR 111 | Ht 64.0 in | Wt 174.0 lb

## 2019-06-23 DIAGNOSIS — I951 Orthostatic hypotension: Secondary | ICD-10-CM

## 2019-06-23 DIAGNOSIS — R55 Syncope and collapse: Secondary | ICD-10-CM | POA: Diagnosis not present

## 2019-06-23 DIAGNOSIS — R0600 Dyspnea, unspecified: Secondary | ICD-10-CM | POA: Diagnosis not present

## 2019-06-23 DIAGNOSIS — R002 Palpitations: Secondary | ICD-10-CM

## 2019-06-23 DIAGNOSIS — R0609 Other forms of dyspnea: Secondary | ICD-10-CM

## 2019-06-23 MED ORDER — TELMISARTAN 40 MG PO TABS: 40.0000 mg | ORAL_TABLET | Freq: Every day | ORAL | 3 refills | Status: DC

## 2019-06-23 NOTE — Progress Notes (Signed)
CARDIOLOGY CONSULT NOTE  Patient ID: Claire [name] MRN: [id number] DOB/AGE: 1970/07/06 49 y.o.  Admit date: (Not on file) Primary Physician: Wyatt Haste, NP  Reason for Consultation: Near syncope  HPI: Claire [name] is a 49 y.o. female who is being seen today for the evaluation of near syncope at the request of Dekoninck, Beth A, NP.   Past medical history includes severe persistent asthma, seizures, fibromyalgia, and obstructive sleep apnea.  I reviewed notes from her PCP.  I personally reviewed ECG performed today which demonstrates sinus tachycardia, 112 bpm, with anteroseptal Q waves and nonspecific ST segment and T wave abnormalities.  She is scheduled to see neurology in the near future.  She has been having more episodes of near syncope this past year.  She denies chest pain.  She has been having episodes of lightheadedness and near syncope for the past 5 to 6 years.  She denies exertional chest pain.  She does have chronic exertional dyspnea related to her asthma.  She denies leg swelling, orthopnea, and paroxysmal nocturnal dyspnea.    She drinks 74 ounces of water daily.  Her birth weight was less than 3 pounds and she attributes most of her health issues to this.  We checked orthostatics in our office and she was found to have orthostatic hypotension.   Allergies  Allergen Reactions  . Baclofen Shortness Of Breath    Swollen ankles  . Gluten Meal   . Claritin [Loratadine] Other (See Comments)    shaking  . Cymbalta [Duloxetine Hcl] Hives    "Hives, forgot who I was"  . Penicillins Hives    Has patient had a PCN reaction causing immediate rash, facial/tongue/throat swelling, SOB or lightheadedness with hypotension: Unknown Has patient had a PCN reaction causing severe rash involving mucus membranes or skin necrosis: Yes Has patient had a PCN reaction that required hospitalization: Unknown Has patient had a PCN reaction occurring within the last 10  years: Unknown If all of the above answers are "NO", then may proceed with Cephalosporin use.  . Sulfa Antibiotics Hives  . Ultram [Tramadol] Other (See Comments)    Dizzy    Current Outpatient Medications  Medication Sig Dispense Refill  . albuterol (PROVENTIL HFA;VENTOLIN HFA) 108 (90 Base) MCG/ACT inhaler Inhale 1-2 puffs into the lungs every 6 (six) hours as needed for wheezing or shortness of breath.     Marland Kitchen albuterol (PROVENTIL) (2.5 MG/3ML) 0.083% nebulizer solution Take 3 mLs (2.5 mg total) by nebulization every 4 (four) hours as needed for wheezing or shortness of breath. 75 mL 1  . budesonide-formoterol (SYMBICORT) 160-4.5 MCG/ACT inhaler Inhale 2 puffs into the lungs 2 (two) times daily. 1 Inhaler 5  . busPIRone (BUSPAR) 10 MG tablet Take 1 tablet (10 mg total) by mouth 3 (three) times daily. 270 tablet 0  . diphenhydrAMINE (BENADRYL) 25 MG tablet Take 50 mg by mouth as needed.     Marland Kitchen escitalopram (LEXAPRO) 20 MG tablet Take 1 tablet (20 mg total) by mouth daily. 90 tablet 0  . Gabapentin, Once-Daily, (GRALISE PO) Take by mouth. gralise 67m 3 tablets at night    . ibuprofen (ADVIL,MOTRIN) 600 MG tablet Take 1 tablet (600 mg total) by mouth every 6 (six) hours as needed. 30 tablet 0  . ipratropium-albuterol (DUONEB) 0.5-2.5 (3) MG/3ML SOLN     . linaclotide (LINZESS) 290 MCG CAPS capsule Take 1 capsule (290 mcg total) by mouth daily before breakfast. 90 capsule 3  .  metoprolol succinate (TOPROL-XL) 25 MG 24 hr tablet Take 1 tablet (25 mg total) by mouth daily. 90 tablet 3  . mirtazapine (REMERON) 45 MG tablet Take 1 tablet (45 mg total) by mouth at bedtime. 90 tablet 0  . Multiple Vitamin (MULTIVITAMIN WITH MINERALS) TABS tablet Take 2 tablets by mouth daily.     Marland Kitchen NARCAN 4 MG/0.1ML LIQD nasal spray kit Place 1 spray into the nose as directed.    Marland Kitchen omeprazole (PRILOSEC) 40 MG capsule Take 1 capsule (40 mg total) by mouth daily before breakfast. 90 capsule 3  . oxyCODONE-acetaminophen  (PERCOCET) 10-325 MG tablet Take 1 tablet by mouth every 6 (six) hours as needed.    Marland Kitchen QUEtiapine (SEROQUEL) 50 MG tablet Take 1 tablet (50 mg total) by mouth at bedtime. 90 tablet 0  . SAVELLA 50 MG TABS tablet Take 50 mg by mouth 2 (two) times daily.    Marland Kitchen telmisartan (MICARDIS) 40 MG tablet Take 1 tablet (40 mg total) by mouth daily. 90 tablet 3  . tiZANidine (ZANAFLEX) 2 MG tablet Take 2 mg by mouth 3 (three) times daily.     Current Facility-Administered Medications  Medication Dose Route Frequency Provider Last Rate Last Admin  . dupilumab (DUPIXENT) prefilled syringe 400 mg  400 mg Subcutaneous Once Kennith Gain, MD        Past Medical History:  Diagnosis Date  . Acid reflux   . Allergy    pollen, dust  . Anxiety   . Arthritis   . Asthma   . Carpal tunnel syndrome    bilateral  . DDD (degenerative disc disease), cervical   . Depression   . Fibromyalgia   . Hypertension   . Migraine   . Palpitations   . Panic attacks   . PTSD (post-traumatic stress disorder)   . Seizures ([place])    unknown etiology; last seizure was 2 years ago; on meds.  . Syncope and collapse   . Tennis elbow   . Ulcer   . Vertigo     Past Surgical History:  Procedure Laterality Date  . BACK SURGERY  1993  . BIOPSY  10/21/2016   Procedure: BIOPSY;  Surgeon: Daneil Dolin, MD;  Location: [facility];  Service: Endoscopy;;  gastric, esophagus  . CHOLECYSTECTOMY    . ESOPHAGOGASTRODUODENOSCOPY (EGD) WITH PROPOFOL N/A 10/21/2016   Procedure: ESOPHAGOGASTRODUODENOSCOPY (EGD) WITH PROPOFOL;  Surgeon: Daneil Dolin, MD;  Location: [facility];  Service: Endoscopy;  Laterality: N/A;  9:30am  . GALLBLADDER SURGERY    . SHOULDER SURGERY Right   . SPINE SURGERY  1993   lumbar disc surgery    Social History   Socioeconomic History  . Marital status: Single    Spouse name: Not on file  . Number of children: 1  . Years of education: HS  . Highest education level: Not on file    Occupational History  . Occupation: disability    Comment: unemployed  Tobacco Use  . Smoking status: Never Smoker  . Smokeless tobacco: Never Used  Substance and Sexual Activity  . Alcohol use: Not Currently    Comment: drink occasionally  . Drug use: No  . Sexual activity: Not Currently    Partners: Female    Birth control/protection: None  Other Topics Concern  . Not on file  Social History Narrative   Patient is left handed.   Patient drinks very little caffeine.   Lives alone   Social Determinants of Health  Financial Resource Strain:   . Difficulty of Paying Living Expenses:   Food Insecurity:   . Worried About Charity fundraiser in the Last Year:   . Arboriculturist in the Last Year:   Transportation Needs:   . Film/video editor (Medical):   Marland Kitchen Lack of Transportation (Non-Medical):   Physical Activity:   . Days of Exercise per Week:   . Minutes of Exercise per Session:   Stress:   . Feeling of Stress :   Social Connections:   . Frequency of Communication with Friends and Family:   . Frequency of Social Gatherings with Friends and Family:   . Attends Religious Services:   . Active Member of Clubs or Organizations:   . Attends Archivist Meetings:   Marland Kitchen Marital Status:   Intimate Partner Violence:   . Fear of Current or Ex-Partner:   . Emotionally Abused:   Marland Kitchen Physically Abused:   . Sexually Abused:      No family history of premature CAD in 1st degree relatives.  Current Meds  Medication Sig  . albuterol (PROVENTIL HFA;VENTOLIN HFA) 108 (90 Base) MCG/ACT inhaler Inhale 1-2 puffs into the lungs every 6 (six) hours as needed for wheezing or shortness of breath.   Marland Kitchen albuterol (PROVENTIL) (2.5 MG/3ML) 0.083% nebulizer solution Take 3 mLs (2.5 mg total) by nebulization every 4 (four) hours as needed for wheezing or shortness of breath.  . budesonide-formoterol (SYMBICORT) 160-4.5 MCG/ACT inhaler Inhale 2 puffs into the lungs 2 (two) times daily.   . busPIRone (BUSPAR) 10 MG tablet Take 1 tablet (10 mg total) by mouth 3 (three) times daily.  . diphenhydrAMINE (BENADRYL) 25 MG tablet Take 50 mg by mouth as needed.   Marland Kitchen escitalopram (LEXAPRO) 20 MG tablet Take 1 tablet (20 mg total) by mouth daily.  . Gabapentin, Once-Daily, (GRALISE PO) Take by mouth. gralise 665m 3 tablets at night  . ibuprofen (ADVIL,MOTRIN) 600 MG tablet Take 1 tablet (600 mg total) by mouth every 6 (six) hours as needed.  .Marland Kitchenipratropium-albuterol (DUONEB) 0.5-2.5 (3) MG/3ML SOLN   . linaclotide (LINZESS) 290 MCG CAPS capsule Take 1 capsule (290 mcg total) by mouth daily before breakfast.  . metoprolol succinate (TOPROL-XL) 25 MG 24 hr tablet Take 1 tablet (25 mg total) by mouth daily.  . mirtazapine (REMERON) 45 MG tablet Take 1 tablet (45 mg total) by mouth at bedtime.  . Multiple Vitamin (MULTIVITAMIN WITH MINERALS) TABS tablet Take 2 tablets by mouth daily.   .Marland KitchenNARCAN 4 MG/0.1ML LIQD nasal spray kit Place 1 spray into the nose as directed.  .Marland Kitchenomeprazole (PRILOSEC) 40 MG capsule Take 1 capsule (40 mg total) by mouth daily before breakfast.  . oxyCODONE-acetaminophen (PERCOCET) 10-325 MG tablet Take 1 tablet by mouth every 6 (six) hours as needed.  .Marland KitchenQUEtiapine (SEROQUEL) 50 MG tablet Take 1 tablet (50 mg total) by mouth at bedtime.  .Marland KitchenSAVELLA 50 MG TABS tablet Take 50 mg by mouth 2 (two) times daily.  .Marland Kitchentelmisartan (MICARDIS) 40 MG tablet Take 1 tablet (40 mg total) by mouth daily.  .Marland KitchentiZANidine (ZANAFLEX) 2 MG tablet Take 2 mg by mouth 3 (three) times daily.  . [DISCONTINUED] telmisartan (MICARDIS) 80 MG tablet Take 80 mg by mouth daily.   Current Facility-Administered Medications for the 06/23/19 encounter (Office Visit) with KHerminio Commons MD  Medication  . dupilumab (DUPIXENT) prefilled syringe 400 mg      Review of systems complete and  found to be negative unless listed above in HPI   Barbarann Ehlers, RN was present throughout the entirety of the  encounter.  Physical exam Blood pressure 90/64, pulse (!) 111, height _0  (1.626 m), weight 174 lb (78.9 kg), SpO2 97 %. General: NAD Neck: No JVD, no thyromegaly or thyroid nodule.  Lungs: Clear to auscultation bilaterally with normal respiratory effort. CV: Nondisplaced PMI.  Mildly tachycardic, regular rhythm, normal S1/S2, no S3/S4, no murmur.  No peripheral edema.  No carotid bruit.    Abdomen: Soft, nontender, no distention.  Skin: Intact without lesions or rashes.  Neurologic: Alert and oriented x 3.  Psych: Normal affect. Extremities: No clubbing or cyanosis.  HEENT: Normal.   ECG: Most recent ECG reviewed.   Labs: Lab Results  Component Value Date/Time   K 3.6 12/10/2017 06:19 PM   BUN 8 12/10/2017 06:19 PM   CREATININE 0.80 12/10/2017 06:19 PM   ALT 16 04/04/2015 07:53 PM   TSH 0.553 10/25/2014 12:41 PM   HGB 14.0 05/13/2019 03:31 PM     Lipids: No results found for: LDLCALC, LDLDIRECT, CHOL, TRIG, HDL      ASSESSMENT AND PLAN:   1.  Near syncope/orthostatic hypotension: She was found to be orthostatic in our office.  I will reduce Micardis to 40 mg daily.  She is also on metoprolol succinate.  She appears to consume 74 ounces of water daily so hypovolemia does not appear to be the issue.  2.  Dyspnea on exertion/palpitations: She does have severe persistent asthma which likely accounts for her shortness of breath. I will order a 2-D echocardiogram with Doppler to evaluate cardiac structure, function, and regional wall motion.     Disposition: Follow up in 3 months virtual visit  Signed: Kate Sable, M.D., F.A.C.C.  06/23/2019, 9:28 AM

## 2019-06-23 NOTE — Patient Instructions (Signed)
Medication Instructions:  DECREASE Micardis to 40 mg daily  *If you need a refill on your cardiac medications before your next appointment, please call your pharmacy*   Lab Work: None today If you have labs (blood work) drawn today and your tests are completely normal, you will receive your results only by: Marland Kitchen MyChart Message (if you have MyChart) OR . A paper copy in the mail If you have any lab test that is abnormal or we need to change your treatment, we will call you to review the results.   Testing/Procedures: Your physician has requested that you have an echocardiogram. Echocardiography is a painless test that uses sound waves to create images of your heart. It provides your doctor with information about the size and shape of your heart and how well your heart's chambers and valves are working. This procedure takes approximately one hour. There are no restrictions for this procedure.     Follow-Up: At [facility], you and your health needs are our priority.  As part of our continuing mission to provide you with exceptional heart care, we have created designated Provider Care Teams.  These Care Teams include your primary Cardiologist (physician) and Advanced Practice Providers (APPs -  Physician Assistants and Nurse Practitioners) who all work together to provide you with the care you need, when you need it.  We recommend signing up for the patient portal called "MyChart".  Sign up information is provided on this After Visit Summary.  MyChart is used to connect with patients for Virtual Visits (Telemedicine).  Patients are able to view lab/test results, encounter notes, upcoming appointments, etc.  Non-urgent messages can be sent to your provider as well.   To learn more about what you can do with MyChart, go to [email or web address].    Your next appointment:   3 month(s)  The format for your next appointment:   Virtual Visit   Provider:   Kate Sable, MD   Other  Instructions None      Thank you for choosing [facility] !

## 2019-06-23 NOTE — Telephone Encounter (Signed)
  Precert needed for: Echo   Location:   [facility]  Date:  Jul 21, 2019

## 2019-06-23 NOTE — Telephone Encounter (Signed)
  Patient Consent for Virtual Visit         Claire [name] has provided verbal consent on 06/23/2019 for a virtual visit (video or telephone).   CONSENT FOR VIRTUAL VISIT FOR:  Claire [name]  By participating in this virtual visit I agree to the following:  I hereby voluntarily request, consent and authorize [facility] and its employed or contracted physicians, Engineer, materials, nurse practitioners or other licensed health care professionals (the Practitioner), to provide me with telemedicine health care services (the "Services") as deemed necessary by the treating Practitioner. I acknowledge and consent to receive the Services by the Practitioner via telemedicine. I understand that the telemedicine visit will involve communicating with the Practitioner through live audiovisual communication technology and the disclosure of certain medical information by electronic transmission. I acknowledge that I have been given the opportunity to request an in-person assessment or other available alternative prior to the telemedicine visit and am voluntarily participating in the telemedicine visit.  I understand that I have the right to withhold or withdraw my consent to the use of telemedicine in the course of my care at any time, without affecting my right to future care or treatment, and that the Practitioner or I may terminate the telemedicine visit at any time. I understand that I have the right to inspect all information obtained and/or recorded in the course of the telemedicine visit and may receive copies of available information for a reasonable fee.  I understand that some of the potential risks of receiving the Services via telemedicine include:  Marland Kitchen Delay or interruption in medical evaluation due to technological equipment failure or disruption; . Information transmitted may not be sufficient (e.g. poor resolution of images) to allow for appropriate medical decision making by the Practitioner;  and/or  . In rare instances, security protocols could fail, causing a breach of personal health information.  Furthermore, I acknowledge that it is my responsibility to provide information about my medical history, conditions and care that is complete and accurate to the best of my ability. I acknowledge that Practitioner's advice, recommendations, and/or decision may be based on factors not within their control, such as incomplete or inaccurate data provided by me or distortions of diagnostic images or specimens that may result from electronic transmissions. I understand that the practice of medicine is not an exact science and that Practitioner makes no warranties or guarantees regarding treatment outcomes. I acknowledge that a copy of this consent can be made available to me via my patient portal ([facility]), or I can request a printed copy by calling the office of [facility].    I understand that my insurance will be billed for this visit.   I have read or had this consent read to me. . I understand the contents of this consent, which adequately explains the benefits and risks of the Services being provided via telemedicine.  . I have been provided ample opportunity to ask questions regarding this consent and the Services and have had my questions answered to my satisfaction. . I give my informed consent for the services to be provided through the use of telemedicine in my medical care

## 2019-06-24 ENCOUNTER — Ambulatory Visit (INDEPENDENT_AMBULATORY_CARE_PROVIDER_SITE_OTHER): Payer: Medicare Other | Admitting: Neurology

## 2019-06-24 DIAGNOSIS — R0683 Snoring: Secondary | ICD-10-CM | POA: Diagnosis not present

## 2019-06-24 DIAGNOSIS — R0681 Apnea, not elsewhere classified: Secondary | ICD-10-CM

## 2019-06-24 DIAGNOSIS — G4720 Circadian rhythm sleep disorder, unspecified type: Secondary | ICD-10-CM

## 2019-06-24 DIAGNOSIS — R351 Nocturia: Secondary | ICD-10-CM

## 2019-06-24 DIAGNOSIS — E663 Overweight: Secondary | ICD-10-CM

## 2019-06-25 NOTE — Procedures (Signed)
Claire [name] is a 49 y.o. female patient. 1. Snoring   2. Witnessed apneic spells   3. Overweight (BMI 25.0-29.9)   4. Nocturia    Past Medical History:  Diagnosis Date  . Acid reflux   . Allergy    pollen, dust  . Anxiety   . Arthritis   . Asthma   . Carpal tunnel syndrome    bilateral  . DDD (degenerative disc disease), cervical   . Depression   . Fibromyalgia   . Hypertension   . Migraine   . Palpitations   . Panic attacks   . PTSD (post-traumatic stress disorder)   . Seizures ([place])    unknown etiology; last seizure was 2 years ago; on meds.  . Syncope and collapse   . Tennis elbow   . Ulcer   . Vertigo    There were no vitals taken for this visit.  Procedures  Beau-Jacques C Tola Meas 06/25/2019

## 2019-06-30 ENCOUNTER — Other Ambulatory Visit: Payer: Self-pay | Admitting: Cardiology

## 2019-06-30 DIAGNOSIS — R55 Syncope and collapse: Secondary | ICD-10-CM

## 2019-07-05 ENCOUNTER — Telehealth: Payer: Self-pay

## 2019-07-05 NOTE — Procedures (Signed)
PATIENT'S NAME:  Claire [name], Claire [name] DOB:      03-09-1971      MR#:    [id number]     DATE OF RECORDING: 06/24/2019 REFERRING M.D.:  Rosealee Albee, NP Study Performed:   Baseline Polysomnogram HISTORY:49 year old female with a history of asthma, hypertension, migraine, fibromyalgia and blackout spells, suspected seizures, allergies, acid reflux, degenerative disc disease, depression, anxiety, history of syncope, PTSD by chart review, palpitations, vertigo, and overweight state, who reports snoring and difficulty going to sleep and staying asleep. The patient endorsed the Epworth Sleepiness Scale at 0 points. The patient's weight 168 pounds with a height of 64 (inches), resulting in a BMI of 28.6 kg/m2. The patient's neck circumference measured 14 inches.  CURRENT MEDICATIONS: Ventolin, Proventil, Symbicort, Buspar, Carbinoxamine maleate, Benadryl, Lexapro, Gabapentin, Advil, Duoneb, Linzess, Toprol-XL, Remeron, Narcan, Prilosec, Percocet, Seroquel, [place], Micardis, Zanaflex   PROCEDURE:  This is a multichannel digital polysomnogram utilizing the Somnostar 11.2 system.  Electrodes and sensors were applied and monitored per AASM Specifications.   EEG, EOG, Chin and Limb EMG, were sampled at 200 Hz.  ECG, Snore and Nasal Pressure, Thermal Airflow, Respiratory Effort, CPAP Flow and Pressure, Oximetry was sampled at 50 Hz. Digital video and audio were recorded.      BASELINE STUDY  Lights Out was at 21:33 and Lights On at 04:58.  Total recording time (TRT) was 445.5 minutes, with a total sleep time (TST) of 421.5 minutes.  The patient's sleep latency was 14 minutes. REM sleep was absent. The sleep efficiency was 94.6 %.     SLEEP ARCHITECTURE: WASO (Wake after sleep onset) was 12.5 minutes with minimal to no sleep fragmentation noted. There were 7 minutes in Stage N1, 391.5 minutes Stage N2, 23 minutes Stage N3 and 0 minutes in Stage REM.  The percentage of Stage N1 was 1.7%, Stage N2 was 92.9%, which is markedly  increased, Stage N3 was 5.5%, which is reduced, and Stage R (REM sleep) was absent. The arousals were noted as: 23 were spontaneous, 0 were associated with PLMs, 2 were associated with respiratory events.  RESPIRATORY ANALYSIS:  There were a total of 10 respiratory events:  0 obstructive apneas, 0 central apneas and 0 mixed apneas with a total of 0 apneas and an apnea index (AI) of 0 /hour. There were 10 hypopneas with a hypopnea index of 1.4 /hour. The patient also had 0 respiratory event related arousals (RERAs).      The total APNEA/HYPOPNEA INDEX (AHI) was 1.4/hour and the total RESPIRATORY DISTURBANCE INDEX was  1.4 /hour.  0 events occurred in REM sleep and 20 events in NREM. The REM AHI was  0 /hour, versus a non-REM AHI of 1.4. The patient spent 421.5 minutes of total sleep time in the supine position and 0 minutes in non-supine.. The supine AHI was 1.4 versus a non-supine AHI of 0.0.  OXYGEN SATURATION & C02:  The Wake baseline 02 saturation was 90%, with the lowest being 84%. Time spent below 89% saturation equaled 11 minutes.  PERIODIC LIMB MOVEMENTS: The patient had a total of 0 Periodic Limb Movements.  The Periodic Limb Movement (PLM) index was 0 and the PLM Arousal index was 0/hour.  Audio and video analysis did not show any abnormal or unusual movements, behaviors, phonations or vocalizations. The patient took no bathroom breaks. Mild intermittent snoring was noted. The EKG was in keeping with normal sinus rhythm (NSR).  Post-study, the patient indicated that sleep was better than usual.   IMPRESSION:  1. Primary Snoring 2. Dysfunctions associated with sleep stages or arousal from sleep  RECOMMENDATIONS:  1. This study does not demonstrate any significant obstructive or central sleep disordered breathing with the exception of mild, intermittent snoring. The absence of REM sleep may underestimate her sleep disordered breathing. This study does not support an intrinsic sleep  disorder as a cause of the patient's symptoms. Other causes, including circadian rhythm disturbances, an underlying mood disorder, medication effect and/or an underlying medical problem cannot be ruled out. 2. This study shows some sleep fragmentation and abnormal sleep stage percentages; these are nonspecific findings and per se do not signify an intrinsic sleep disorder or a cause for the patient's sleep-related symptoms. Causes include (but are not limited to) the first night effect of the sleep study, circadian rhythm disturbances, medication effect or an underlying mood disorder or medical problem.  3. The patient should be cautioned not to drive, work at heights, or operate dangerous or heavy equipment when tired or sleepy. Review and reiteration of good sleep hygiene measures should be pursued with any patient. 4. The patient will be advised to follow up with the referring provider, who will be notified of the test results.  I certify that I have reviewed the entire raw data recording prior to the issuance of this report in accordance with the Standards of Accreditation of the American Academy of Sleep Medicine (AASM)  Star Age, MD, PhD Diplomat, American Board of Neurology and Sleep Medicine (Neurology and Sleep Medicine)

## 2019-07-05 NOTE — Telephone Encounter (Signed)
I called pt to discuss. No answer, left a message asking her to call me back. 

## 2019-07-05 NOTE — Telephone Encounter (Signed)
-----   Message from Star Age, MD sent at 07/05/2019  7:53 AM EDT ----- Patient referred by Dr. Nelva Bush with allergy and asthma, seen by me on 06/09/19, diagnostic PSG on 06/24/19.   Please call and notify the patient that the recent sleep study did not show any significant obstructive sleep apnea with the exception of mild, intermittent snoring. She did not achieve any dream sleep, and the absence of REM sleep (dream sleep) may have underestimated her sleep disordered breathing.   She can FU with her referring MD and PCP as scheduled.   Please remind patient to try to maintain good sleep hygiene, which means: Keep a regular sleep and wake schedule and make enough time for sleep (7 1/2 to 8 1/2 hours for the average adult), try not to exercise or have a meal within 2 hours of your bedtime, try to keep your bedroom conducive for sleep, that is, cool and dark, without light distractors such as an illuminated alarm clock, and refrain from watching TV right before sleep or in the middle of the night and do not keep the TV or radio on during the night. If a nightlight is used, have it away from the visual field. Also, try not to use or play on electronic devices at bedtime, such as your cell phone, tablet PC or laptop. If you like to read at bedtime on an electronic device, try to dim the background light as much as possible. Do not eat in the middle of the night. Keep pets away from the bedroom environment. For stress relief, try meditation, deep breathing exercises (there are many books and CDs available), a [place]abi noise machine or fan can help to diffuse other noise distractors, such as traffic noise. Do not drink alcohol before bedtime, as it can disturb sleep and cause middle of the night awakenings. Never mix alcohol and sedating medications! Avoid narcotic pain medication close to bedtime, as opioids/narcotics can suppress breathing drive and breathing effort.    Thanks,  Star Age, MD, PhD [facility])

## 2019-07-05 NOTE — Progress Notes (Signed)
Patient referred by Dr. Nelva Bush with allergy and asthma, seen by me on 06/09/19, diagnostic PSG on 06/24/19.   Please call and notify the patient that the recent sleep study did not show any significant obstructive sleep apnea with the exception of mild, intermittent snoring. She did not achieve any dream sleep, and the absence of REM sleep (dream sleep) may have underestimated her sleep disordered breathing.   She can FU with her referring MD and PCP as scheduled.   Please remind patient to try to maintain good sleep hygiene, which means: Keep a regular sleep and wake schedule and make enough time for sleep (7 1/2 to 8 1/2 hours for the average adult), try not to exercise or have a meal within 2 hours of your bedtime, try to keep your bedroom conducive for sleep, that is, cool and dark, without light distractors such as an illuminated alarm clock, and refrain from watching TV right before sleep or in the middle of the night and do not keep the TV or radio on during the night. If a nightlight is used, have it away from the visual field. Also, try not to use or play on electronic devices at bedtime, such as your cell phone, tablet PC or laptop. If you like to read at bedtime on an electronic device, try to dim the background light as much as possible. Do not eat in the middle of the night. Keep pets away from the bedroom environment. For stress relief, try meditation, deep breathing exercises (there are many books and CDs available), a [place] noise machine or fan can help to diffuse other noise distractors, such as traffic noise. Do not drink alcohol before bedtime, as it can disturb sleep and cause middle of the night awakenings. Never mix alcohol and sedating medications! Avoid narcotic pain medication close to bedtime, as opioids/narcotics can suppress breathing drive and breathing effort.    Thanks,  Star Age, MD, PhD [facility])

## 2019-07-06 NOTE — Telephone Encounter (Signed)
I called pt and discussed her sleep study results. Pt reports that she has not slept well for years and it felt like she only got 15 minutes of sleep the night of her study. I advised her that the TST was 421.5 minutes. Pt has many questions regarding this. I offered pt an appt with Dr. Rexene Alberts to discuss this further. Pt accepted an appt on 07/13/19 at 2:00pm. Pt verbalized understanding of results and of new appt date and time.

## 2019-07-13 ENCOUNTER — Ambulatory Visit (INDEPENDENT_AMBULATORY_CARE_PROVIDER_SITE_OTHER): Payer: Medicare Other | Admitting: Neurology

## 2019-07-13 ENCOUNTER — Encounter: Payer: Self-pay | Admitting: Neurology

## 2019-07-13 ENCOUNTER — Other Ambulatory Visit: Payer: Self-pay

## 2019-07-13 VITALS — BP 124/87 | HR 100 | Temp 97.4°F | Ht 64.0 in | Wt 171.0 lb

## 2019-07-13 DIAGNOSIS — G478 Other sleep disorders: Secondary | ICD-10-CM

## 2019-07-13 NOTE — Patient Instructions (Signed)
As discussed, your recent sleep study did not show any significant obstructive sleep apnea.  You slept more than 90% of the time tested but most of your sleep was light stage sleep which can feel nonrestorative.  You did not achieve any dream sleep which can be a medication effect.  There is no specific underlying sleep disorder from my end of things, based on your history and sleep study results.  Thankfully, you do not have to be treated with a CPAP or AutoPap machine for sleep apnea.  Please follow-up with your primary care and other specialists as scheduled.

## 2019-07-13 NOTE — Progress Notes (Signed)
Subjective:    Patient ID: Claire [name] is a 49 y.o. female.  HPI     Interim history:   [name] is a 49 year old left-handed woman with an underlying medical history of asthma, hypertension, migraine, fibromyalgia and blackout spells, suspected seizures on Keppra (for which she has seen Dr. Jannifer Franklin), allergies, acid reflux, degenerative disc disease, depression, anxiety, history of syncope, PTSD by chart review, palpitations, vertigo, and overweight state, who Presents for follow-up consultation of her sleep disturbance, after interim sleep testing.  The patient is unaccompanied today.  I first met her on 06/09/2019 at the request of her allergy and asthma specialist, at which time the patient reported snoring and excessive daytime somnolence.  She had trouble going to sleep and staying asleep.  She was advised to proceed with a sleep study.  She had a baseline sleep study on 06/24/2019, which showed a sleep efficiency of 94.6%, sleep was primarily light stage sleep with over 90% of stage II sleep and absence of REM sleep.  Her total AHI was 1.4/h, O2 nadir was 84%, average oxygen saturation was 90%.  She had no significant PLM's.  Mild and intermittent snoring was noted. She was advised regarding her test results by phone call.  She requested a follow-up appointment to discuss findings in more detail.  Today, 07/13/19: She reports no new symptoms, as far as her sleep is concerned.  She reports that at the time of her sleep study she fell asleep much quicker than usual at home.  She did not feel fully rested, she did not think she slept nearly as much as the sleep study indicated.  We talked about her sleep study results in detail.  She feels that the BuSpar is not as effective for her anxiety.  She has an appointment with her psychiatrist/behavioral health coming up.  She has also made a follow-up appointment with Dr. Jannifer Franklin.  Previously:   06/09/19: (She) reports snoring and excessive daytime  somnolence.  I reviewed the office note from 05/13/2019.  Her Epworth sleepiness score is 0/24, Fatigue severity score is 62 out of 63.  She reports trouble going to sleep and staying asleep.  She has tried melatonin over-the-counter which did not help.  She does snore.  She is single and lives alone but has been told by a cousin that she had pauses in her breathing when she was witnessed to sleep on the couch one time.  The patient denies any recurrent morning headaches but does have recurrent headaches she reports.  She has not made a follow-up appointment with Dr. Jannifer Franklin but would like to make one on her way out today.  She reports increase in stress.  She reports not being able to stay asleep.  She has no obvious family history of sleep apnea as she recalls.  She does not have a TV in her bedroom but sometimes does look at her phone at night.  She has a bedtime between 11 and midnight and rise time generally between 6 and 7.  She is a non-smoker and does not drink alcohol.  She drinks very little caffeine, approximately 1 soda per week.  Her Past Medical History Is Significant For: Past Medical History:  Diagnosis Date  . Acid reflux   . Allergy    pollen, dust  . Anxiety   . Arthritis   . Asthma   . Carpal tunnel syndrome    bilateral  . DDD (degenerative disc disease), cervical   . Depression   .  Fibromyalgia   . Hypertension   . Migraine   . Palpitations   . Panic attacks   . PTSD (post-traumatic stress disorder)   . Seizures ([place])    unknown etiology; last seizure was 2 years ago; on meds.  . Syncope and collapse   . Tennis elbow   . Ulcer   . Vertigo     Her Past Surgical History Is Significant For: Past Surgical History:  Procedure Laterality Date  . BACK SURGERY  1993  . BIOPSY  10/21/2016   Procedure: BIOPSY;  Surgeon: Daneil Dolin, MD;  Location: [facility];  Service: Endoscopy;;  gastric, esophagus  . CHOLECYSTECTOMY    . ESOPHAGOGASTRODUODENOSCOPY (EGD) WITH  PROPOFOL N/A 10/21/2016   Procedure: ESOPHAGOGASTRODUODENOSCOPY (EGD) WITH PROPOFOL;  Surgeon: Daneil Dolin, MD;  Location: [facility];  Service: Endoscopy;  Laterality: N/A;  9:30am  . GALLBLADDER SURGERY    . SHOULDER SURGERY Right   . SPINE SURGERY  1993   lumbar disc surgery    Her Family History Is Significant For: Family History  Problem Relation Age of Onset  . COPD Mother   . Bronchitis Mother   . Arthritis Mother   . Depression Mother   . Heart disease Mother   . Hypertension Mother   . Colon cancer Maternal Grandmother   . Alcohol abuse Father   . Early death Father        GSW  . PKU Son     Her Social History Is Significant For: Social History   Socioeconomic History  . Marital status: Single    Spouse name: Not on file  . Number of children: 1  . Years of education: HS  . Highest education level: Not on file  Occupational History  . Occupation: disability    Comment: unemployed  Tobacco Use  . Smoking status: Never Smoker  . Smokeless tobacco: Never Used  Substance and Sexual Activity  . Alcohol use: Not Currently    Comment: drink occasionally  . Drug use: No  . Sexual activity: Not Currently    Partners: Female    Birth control/protection: None  Other Topics Concern  . Not on file  Social History Narrative   Patient is left handed.   Patient drinks very little caffeine.   Lives alone   Social Determinants of Health   Financial Resource Strain:   . Difficulty of Paying Living Expenses:   Food Insecurity:   . Worried About Charity fundraiser in the Last Year:   . Arboriculturist in the Last Year:   Transportation Needs:   . Film/video editor (Medical):   Marland Kitchen Lack of Transportation (Non-Medical):   Physical Activity:   . Days of Exercise per Week:   . Minutes of Exercise per Session:   Stress:   . Feeling of Stress :   Social Connections:   . Frequency of Communication with Friends and Family:   . Frequency of Social Gatherings  with Friends and Family:   . Attends Religious Services:   . Active Member of Clubs or Organizations:   . Attends Archivist Meetings:   Marland Kitchen Marital Status:     Her Allergies Are:  Allergies  Allergen Reactions  . Baclofen Shortness Of Breath    Swollen ankles  . Gluten Meal   . Claritin [Loratadine] Other (See Comments)    shaking  . Cymbalta [Duloxetine Hcl] Hives    "Hives, forgot who I was"  .  Penicillins Hives    Has patient had a PCN reaction causing immediate rash, facial/tongue/throat swelling, SOB or lightheadedness with hypotension: Unknown Has patient had a PCN reaction causing severe rash involving mucus membranes or skin necrosis: Yes Has patient had a PCN reaction that required hospitalization: Unknown Has patient had a PCN reaction occurring within the last 10 years: Unknown If all of the above answers are "NO", then may proceed with Cephalosporin use.  . Sulfa Antibiotics Hives  . Ultram [Tramadol] Other (See Comments)    Dizzy  :   Her Current Medications Are:  Outpatient Encounter Medications as of 07/13/2019  Medication Sig  . albuterol (PROVENTIL HFA;VENTOLIN HFA) 108 (90 Base) MCG/ACT inhaler Inhale 1-2 puffs into the lungs every 6 (six) hours as needed for wheezing or shortness of breath.   Marland Kitchen albuterol (PROVENTIL) (2.5 MG/3ML) 0.083% nebulizer solution Take 3 mLs (2.5 mg total) by nebulization every 4 (four) hours as needed for wheezing or shortness of breath.  . budesonide-formoterol (SYMBICORT) 160-4.5 MCG/ACT inhaler Inhale 2 puffs into the lungs 2 (two) times daily.  . busPIRone (BUSPAR) 10 MG tablet Take 1 tablet (10 mg total) by mouth 3 (three) times daily.  . diphenhydrAMINE (BENADRYL) 25 MG tablet Take 50 mg by mouth as needed.   Marland Kitchen escitalopram (LEXAPRO) 20 MG tablet Take 1 tablet (20 mg total) by mouth daily.  . Gabapentin, Once-Daily, (GRALISE PO) Take by mouth. gralise '600mg'$  3 tablets at night  . ibuprofen (ADVIL,MOTRIN) 600 MG tablet Take  1 tablet (600 mg total) by mouth every 6 (six) hours as needed.  Marland Kitchen ipratropium-albuterol (DUONEB) 0.5-2.5 (3) MG/3ML SOLN   . linaclotide (LINZESS) 290 MCG CAPS capsule Take 1 capsule (290 mcg total) by mouth daily before breakfast.  . metoprolol succinate (TOPROL-XL) 25 MG 24 hr tablet Take 1 tablet (25 mg total) by mouth daily.  . mirtazapine (REMERON) 45 MG tablet Take 1 tablet (45 mg total) by mouth at bedtime.  . Multiple Vitamin (MULTIVITAMIN WITH MINERALS) TABS tablet Take 2 tablets by mouth daily.   Marland Kitchen NARCAN 4 MG/0.1ML LIQD nasal spray kit Place 1 spray into the nose as directed.  Marland Kitchen omeprazole (PRILOSEC) 40 MG capsule Take 1 capsule (40 mg total) by mouth daily before breakfast.  . oxyCODONE-acetaminophen (PERCOCET) 10-325 MG tablet Take 1 tablet by mouth every 6 (six) hours as needed.  Marland Kitchen QUEtiapine (SEROQUEL) 50 MG tablet Take 1 tablet (50 mg total) by mouth at bedtime.  Marland Kitchen SAVELLA 50 MG TABS tablet Take 50 mg by mouth 2 (two) times daily.  Marland Kitchen telmisartan (MICARDIS) 40 MG tablet Take 1 tablet (40 mg total) by mouth daily.  Marland Kitchen tiZANidine (ZANAFLEX) 2 MG tablet Take 2 mg by mouth 3 (three) times daily.   Facility-Administered Encounter Medications as of 07/13/2019  Medication  . dupilumab (DUPIXENT) prefilled syringe 400 mg  :  Review of Systems:  Out of a complete 14 point review of systems, all are reviewed and negative with the exception of these symptoms as listed below: Review of Systems  Neurological:       Here to f/u on recent sleep study. Pt wanted to review questions with MD.     Objective:  Neurological Exam  Physical Exam Physical Examination:   Vitals:   07/13/19 1357  BP: 124/87  Pulse: 100  Temp: (!) 97.4 F (36.3 C)    General Examination: The patient is a very pleasant 49 y.o. female in no acute distress. She appears well-developed and well-nourished and  well groomed.   HEENT: Normocephalic, atraumatic, hearing is grossly intact. Face is symmetric with  normal facial animation. Speech is clear with no dysarthria noted. There is no hypophonia. There is no lip, neck/head, jaw or voice tremor.   Chest: Clear to auscultation without wheezing, rhonchi or crackles noted.  Heart: S1+S2+0, regular with mild tachycardia.   Abdomen: Soft, non-tender and non-distended with normal bowel sounds appreciated on auscultation.  Extremities: There is no noted in the distal lower extremities bilaterally.   Skin: Warm and dry without trophic changes noted.   Musculoskeletal: exam reveals no obvious joint deformities, tenderness or joint swelling or erythema.   Neurologically:  Mental status: The patient is awake, alert and oriented in all 4 spheres. Her immediate and remote memory, attention, language skills and fund of knowledge are appropriate. There is no evidence of aphasia, agnosia, apraxia or anomia. Speech is clear with normal prosody and enunciation. Thought process is linear. Mood is normal and affect is normal.  Cranial nerves II - XII are as described above under HEENT exam.  Motor exam: Normal bulk, strength and tone is noted. There is no obvious tremor. Fine motor skills and coordination: grossly intact.  Cerebellar testing: No dysmetria or intention tremor. There is no truncal or gait ataxia.  Sensory exam: intact to light touch in the upper and lower extremities.  Gait, station and balance: She stands with mild difficulty and she walks with a single-point walking stick held on the left side.   Assessment & Plan:  In summary, Claire [name] is a very pleasant 49 year old female with an underlying medical history of asthma, hypertension, migraine, fibromyalgia and blackout spells, suspected seizures on Keppra (for which she has seen Dr. Jannifer Franklin), allergies, acid reflux, degenerative disc disease, depression, anxiety, history of syncope, PTSD by chart review, palpitations, vertigo, and overweight state, who presents for follow-up consultation  after her recent sleep study.  She had a baseline sleep study on 06/24/2019.  She had a sleep efficiency of 94.6%, sleep latency 14 minutes, but REM sleep was absent.  She had mostly light states sleep, and was noted to have mild intermittent snoring, no significant periodic leg movements of sleep.  Overall AHI was 1.4/h.  She had very few milder desaturations into the mid 80s, nadir of 84%.  She typically does not sleep on her back and does not fall asleep that quickly she reports.  She is advised that there can be multiple reasons why the patient sleeps differently, even better than usual in the sleep lab situation.  The absence of REM sleep may potentially have underestimated her sleep disordered breathing.  Based on the sleep study results, she does not qualify for CPAP or AutoPap therapy.  We talked about the importance of good sleep hygiene.  We talked about medication effect which can cause changes in sleep architecture. At this juncture, she is advised to follow-up with her primary care and specialist as scheduled.  I answered all her questions today and she was in agreement.

## 2019-07-19 ENCOUNTER — Other Ambulatory Visit: Payer: Self-pay | Admitting: Physical Medicine and Rehabilitation

## 2019-07-19 ENCOUNTER — Ambulatory Visit
Admission: RE | Admit: 2019-07-19 | Discharge: 2019-07-19 | Disposition: A | Discharge status: Home / Self Care | Payer: Medicare Other | Source: Ambulatory Visit | Attending: Physical Medicine and Rehabilitation | Admitting: Physical Medicine and Rehabilitation

## 2019-07-19 DIAGNOSIS — M545 Low back pain, unspecified: Secondary | ICD-10-CM

## 2019-07-21 ENCOUNTER — Other Ambulatory Visit: Payer: Self-pay

## 2019-07-21 ENCOUNTER — Ambulatory Visit (INDEPENDENT_AMBULATORY_CARE_PROVIDER_SITE_OTHER): Payer: Medicare Other

## 2019-07-21 DIAGNOSIS — R55 Syncope and collapse: Secondary | ICD-10-CM

## 2019-07-23 ENCOUNTER — Telehealth: Payer: Self-pay | Admitting: *Deleted

## 2019-07-23 NOTE — Telephone Encounter (Signed)
Claire [name], [place]  [id number] AM EDT    Patient notified. Copy to pcp.    Herminio Commons, MD  07/23/2019 8:13 AM EDT    Normal echo
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
## 2019-08-03 ENCOUNTER — Encounter (HOSPITAL_COMMUNITY): Payer: Self-pay | Admitting: Psychiatry

## 2019-08-03 ENCOUNTER — Telehealth (INDEPENDENT_AMBULATORY_CARE_PROVIDER_SITE_OTHER): Payer: Medicare Other | Admitting: Psychiatry

## 2019-08-03 ENCOUNTER — Other Ambulatory Visit: Payer: Self-pay

## 2019-08-03 DIAGNOSIS — F411 Generalized anxiety disorder: Secondary | ICD-10-CM | POA: Diagnosis not present

## 2019-08-03 DIAGNOSIS — F4310 Post-traumatic stress disorder, unspecified: Secondary | ICD-10-CM

## 2019-08-03 DIAGNOSIS — F410 Panic disorder [episodic paroxysmal anxiety] without agoraphobia: Secondary | ICD-10-CM

## 2019-08-03 MED ORDER — QUETIAPINE FUMARATE 50 MG PO TABS: 50.0000 mg | ORAL_TABLET | Freq: Every day | ORAL | 0 refills | Status: DC

## 2019-08-03 MED ORDER — BUSPIRONE HCL 10 MG PO TABS: 10.0000 mg | ORAL_TABLET | Freq: Three times a day (TID) | ORAL | 0 refills | Status: DC

## 2019-08-03 MED ORDER — ESCITALOPRAM OXALATE 20 MG PO TABS: 20.0000 mg | ORAL_TABLET | Freq: Every day | ORAL | 0 refills | Status: DC

## 2019-08-03 MED ORDER — MIRTAZAPINE 45 MG PO TABS: 45.0000 mg | ORAL_TABLET | Freq: Every day | ORAL | 0 refills | Status: DC

## 2019-08-03 NOTE — Patient Instructions (Signed)
1. Continue lexapro 20 mg daily 2.Continuemirtazapine 45mg  at night 3.Continuequetiapine50mg  at night  4. Continue buspar 10 mg three times day 5. Next appointment:8/9 at 2:30

## 2019-08-12 ENCOUNTER — Ambulatory Visit: Payer: Medicare Other | Admitting: Allergy

## 2019-08-18 ENCOUNTER — Other Ambulatory Visit: Payer: Self-pay | Admitting: Gastroenterology

## 2019-08-24 ENCOUNTER — Ambulatory Visit (INDEPENDENT_AMBULATORY_CARE_PROVIDER_SITE_OTHER): Payer: Medicare Other | Admitting: Neurology

## 2019-08-24 ENCOUNTER — Encounter: Payer: Self-pay | Admitting: Neurology

## 2019-08-24 VITALS — BP 126/95 | HR 92 | Ht 64.0 in | Wt 176.0 lb

## 2019-08-24 DIAGNOSIS — R569 Unspecified convulsions: Secondary | ICD-10-CM

## 2019-08-24 MED ORDER — LEVETIRACETAM 500 MG PO TABS: 500.0000 mg | ORAL_TABLET | Freq: Two times a day (BID) | ORAL | 3 refills | Status: DC

## 2019-08-24 NOTE — Patient Instructions (Signed)
With the keppra 500 mg tablet, start one a day for one week, then take one twice a day
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
## 2019-09-09 ENCOUNTER — Telehealth (HOSPITAL_COMMUNITY): Payer: Self-pay | Admitting: *Deleted

## 2019-09-09 NOTE — Telephone Encounter (Signed)
Patient called stating that for the past 2.5 weeks regularly at night she have been having night terror and panic attacks

## 2019-09-09 NOTE — Telephone Encounter (Signed)
Ask her if she is interested in increasing the dose of buspar to 15 mg three times a day. Let me know if she is so that I can order medication to the pharmacy.

## 2019-09-10 ENCOUNTER — Other Ambulatory Visit (HOSPITAL_COMMUNITY): Payer: Self-pay | Admitting: Psychiatry

## 2019-09-10 ENCOUNTER — Encounter (HOSPITAL_COMMUNITY): Payer: Self-pay | Admitting: *Deleted

## 2019-09-10 DIAGNOSIS — F410 Panic disorder [episodic paroxysmal anxiety] without agoraphobia: Secondary | ICD-10-CM

## 2019-09-10 DIAGNOSIS — F411 Generalized anxiety disorder: Secondary | ICD-10-CM

## 2019-09-10 MED ORDER — BUSPIRONE HCL 15 MG PO TABS: 15.0000 mg | ORAL_TABLET | Freq: Three times a day (TID) | ORAL | 1 refills | Status: DC

## 2019-09-10 NOTE — Telephone Encounter (Signed)
Ordered Buspar 15 mg three times a day

## 2019-09-10 NOTE — Telephone Encounter (Signed)
Spoke with patient and she is interested in increasing her Buspar 15 mg.

## 2019-09-14 NOTE — Telephone Encounter (Signed)
Per Epic patient read [employer] with information from provider about her medication

## 2019-09-22 ENCOUNTER — Telehealth: Payer: Medicare Other | Admitting: Cardiovascular Disease

## 2019-09-22 ENCOUNTER — Other Ambulatory Visit: Payer: Self-pay | Admitting: General Practice

## 2019-09-22 DIAGNOSIS — R102 Pelvic and perineal pain: Secondary | ICD-10-CM

## 2019-09-28 ENCOUNTER — Ambulatory Visit: Payer: Medicare Other | Admitting: Family Medicine

## 2019-09-28 NOTE — Progress Notes (Deleted)
Cardiology Office Note  Date: 09/28/2019   ID: Claire [name], DOB 24-Oct-1970, MRN [id number]  PCP:  Dara Lords, NP  Cardiologist:  Kate Sable, MD Electrophysiologist:  None   Chief Complaint: Follow-up near syncope  History of Present Illness: Claire [name] is a 49 y.o. female with a history of near syncope, severe persistent asthma, seizures, fibromyalgia, OSA.  Initially seen by Dr. Bronson Ing 06/23/2019 for near syncopal episode.  She had been having more near syncope episodes the past year.  She denied any chest pain.  Had been experiencing episodes of lightheadedness and near syncope for the past 5 to 6 years.  She denied any exertional chest pain.  Had chronic exertional dyspnea related to her asthma.  Denied any leg swelling, orthopnea, PND.  Orthostatics were checked in the office at that time and she was found to have orthostatic hypotension.  Her EKG in the office that day showed sinus tachycardia with a rate of 112 anteroseptal Q waves and nonspecific ST segment and T wave abnormalities.  Her Micardis was reduced to 40 mg daily.  A 2D echocardiogram was ordered to evaluate cardiac structure, function, regional wall motion.   Her echocardiogram was normal with EF of 60 to 65%.  No R WMA's.  No diastolic dysfunction.  Normal valves.  1. Left ventricular ejection fraction, by estimation, is 60 to 65%. The left ventricle has normal function. The left ventricle has no regional wall motion abnormalities. Left ventricular diastolic parameters were normal. 2. Right ventricular systolic function is normal. The right ventricular size is normal. There is normal pulmonary artery systolic pressure. 3. The mitral valve is normal in structure. No evidence of mitral valve regurgitation. No evidence of mitral stenosis. 4. The aortic valve was not well visualized. Aortic valve regurgitation is not visualized. No aortic stenosis is present.  Past Medical History:  Diagnosis Date    . Acid reflux   . Allergy    pollen, dust  . Anxiety   . Arthritis   . Asthma   . Carpal tunnel syndrome    bilateral  . DDD (degenerative disc disease), cervical   . Depression   . Fibromyalgia   . Hypertension   . Migraine   . Palpitations   . Panic attacks   . PTSD (post-traumatic stress disorder)   . Seizures ([place])    unknown etiology; last seizure was 2 years ago; on meds.  . Syncope and collapse   . Tennis elbow   . Ulcer   . Vertigo     Past Surgical History:  Procedure Laterality Date  . BACK SURGERY  1993  . BIOPSY  10/21/2016   Procedure: BIOPSY;  Surgeon: Daneil Dolin, MD;  [facility];  Service: Endoscopy;;  gastric, esophagus  . CHOLECYSTECTOMY    . ESOPHAGOGASTRODUODENOSCOPY (EGD) WITH PROPOFOL N/A 10/21/2016   Procedure: ESOPHAGOGASTRODUODENOSCOPY (EGD) WITH PROPOFOL;  Surgeon: Daneil Dolin, MD;  [facility];  Service: Endoscopy;  Laterality: N/A;  9:30am  . GALLBLADDER SURGERY    . SHOULDER SURGERY Right   . SPINE SURGERY  1993   lumbar disc surgery    Current Outpatient Medications  Medication Sig Dispense Refill  . albuterol (PROVENTIL HFA;VENTOLIN HFA) 108 (90 Base) MCG/ACT inhaler Inhale 1-2 puffs into the lungs every 6 (six) hours as needed for wheezing or shortness of breath.     Marland Kitchen albuterol (PROVENTIL) (2.5 MG/3ML) 0.083% nebulizer solution Take 3 mLs (2.5 mg total) by nebulization  every 4 (four) hours as needed for wheezing or shortness of breath. 75 mL 1  . budesonide-formoterol (SYMBICORT) 160-4.5 MCG/ACT inhaler Inhale 2 puffs into the lungs 2 (two) times daily. 1 Inhaler 5  . busPIRone (BUSPAR) 15 MG tablet Take 1 tablet (15 mg total) by mouth 3 (three) times daily. 90 tablet 1  . diphenhydrAMINE (BENADRYL) 25 MG tablet Take 50 mg by mouth as needed.     Marland Kitchen escitalopram (LEXAPRO) 20 MG tablet Take 1 tablet (20 mg total) by mouth daily. 90 tablet 0  . Gabapentin, Once-Daily, (GRALISE PO) Take by mouth. gralise '600mg'$  3  tablets at night    . ibuprofen (ADVIL,MOTRIN) 600 MG tablet Take 1 tablet (600 mg total) by mouth every 6 (six) hours as needed. 30 tablet 0  . ipratropium-albuterol (DUONEB) 0.5-2.5 (3) MG/3ML SOLN     . levETIRAcetam (KEPPRA) 500 MG tablet Take 1 tablet (500 mg total) by mouth 2 (two) times daily. 180 tablet 3  . LINZESS 290 MCG CAPS capsule TAKE 1 CAPSULE BY MOUTH ONCE DAILY BEFORE BREAKFAST 90 capsule 3  . methocarbamol (ROBAXIN) 500 MG tablet Take 500 mg by mouth 3 (three) times daily as needed.    . metoprolol succinate (TOPROL-XL) 25 MG 24 hr tablet Take 1 tablet (25 mg total) by mouth daily. 90 tablet 3  . mirtazapine (REMERON) 45 MG tablet Take 1 tablet (45 mg total) by mouth at bedtime. 90 tablet 0  . montelukast (SINGULAIR) 10 MG tablet Take 10 mg by mouth at bedtime.    . Multiple Vitamin (MULTIVITAMIN WITH MINERALS) TABS tablet Take 2 tablets by mouth daily.     Marland Kitchen NARCAN 4 MG/0.1ML LIQD nasal spray kit Place 1 spray into the nose as directed.    Marland Kitchen omeprazole (PRILOSEC) 40 MG capsule Take 1 capsule (40 mg total) by mouth daily before breakfast. 90 capsule 3  . oxyCODONE-acetaminophen (PERCOCET) 10-325 MG tablet Take 1 tablet by mouth every 6 (six) hours as needed.    Marland Kitchen QUEtiapine (SEROQUEL) 50 MG tablet Take 1 tablet (50 mg total) by mouth at bedtime. 90 tablet 0  . SAVELLA 50 MG TABS tablet Take 50 mg by mouth 2 (two) times daily.    Marland Kitchen tiZANidine (ZANAFLEX) 2 MG tablet Take 2 mg by mouth 3 (three) times daily.     Current Facility-Administered Medications  Medication Dose Route Frequency Provider Last Rate Last Admin  . dupilumab (DUPIXENT) prefilled syringe 400 mg  400 mg Subcutaneous Once Kennith Gain, MD       Allergies:  Baclofen, Gluten meal, Claritin [loratadine], Cymbalta [duloxetine hcl], Penicillins, Sulfa antibiotics, and Ultram [tramadol]   Social History: The patient  reports that she has never smoked. She has never used smokeless tobacco. She reports  previous alcohol use. She reports that she does not use drugs.   Family History: The patient's family history includes Alcohol abuse in her father; Arthritis in her mother; Bronchitis in her mother; COPD in her mother; Colon cancer in her maternal grandmother; Depression in her mother; Early death in her father; Heart disease in her mother; Hypertension in her mother; PKU in her son.   ROS:  Please see the history of present illness. Otherwise, complete review of systems is positive for {NONE DEFAULTED:18576::"none"}.  All other systems are reviewed and negative.   Physical Exam: VS:  There were no vitals taken for this visit., BMI There is no height or weight on file to calculate BMI.  Wt Readings from Last  3 Encounters:  08/24/19 176 lb (79.8 kg)  07/13/19 171 lb (77.6 kg)  06/23/19 174 lb (78.9 kg)    General: Patient appears comfortable at rest. HEENT: Conjunctiva and lids normal, oropharynx clear with moist mucosa. Neck: Supple, no elevated JVP or carotid bruits, no thyromegaly. Lungs: Clear to auscultation, nonlabored breathing at rest. Cardiac: Regular rate and rhythm, no S3 or significant systolic murmur, no pericardial rub. Abdomen: Soft, nontender, no hepatomegaly, bowel sounds present, no guarding or rebound. Extremities: No pitting edema, distal pulses 2+. Skin: Warm and dry. Musculoskeletal: No kyphosis. Neuropsychiatric: Alert and oriented x3, affect grossly appropriate.  ECG:  {EKG/Telemetry Strips [phone]}  Recent Labwork: 05/13/2019: Hemoglobin 14.0; Platelets 427  No results found for: CHOL, TRIG, HDL, CHOLHDL, VLDL, LDLCALC, LDLDIRECT  Other Studies Reviewed Today:  Echocardiogram 07/21/2019  1. Left ventricular ejection fraction, by estimation, is 60 to 65%. The left ventricle has normal function. The left ventricle has no regional wall motion abnormalities. Left ventricular diastolic parameters were normal. 2. Right ventricular systolic function is  normal. The right ventricular size is normal. There is normal pulmonary artery systolic pressure. 3. The mitral valve is normal in structure. No evidence of mitral valve regurgitation. No evidence of mitral stenosis. 4. The aortic valve was not well visualized. Aortic valve regurgitation is not visualized. No aortic stenosis is present.  Assessment and Plan:  1. Near syncope   2. Orthostatic hypotension   3. Palpitations   4. DOE (dyspnea on exertion)    1. Near syncope Myocarditis was reduced to 40 mg at last visit  2. Orthostatic hypotension ***  3. Palpitations ***  4. DOE (dyspnea on exertion) ***   Medication Adjustments/Labs and Tests Ordered: Current medicines are reviewed at length with the patient today.  Concerns regarding medicines are outlined above.   Disposition: Follow-up with ***  Signed, Levell July, NP 09/28/2019 12:29 PM    [facility] [place], [place], [place] [address] Phone: [phone]; Fax: [phone]

## 2019-10-14 ENCOUNTER — Telehealth: Payer: Self-pay | Admitting: Family Medicine

## 2019-10-14 NOTE — Telephone Encounter (Signed)
  Patient Consent for Virtual Visit         Claire [name] has provided verbal consent on 10/14/2019 for a virtual visit (video or telephone).   CONSENT FOR VIRTUAL VISIT FOR:  Claire [name]  By participating in this virtual visit I agree to the following:  I hereby voluntarily request, consent and authorize [facility] and its employed or contracted physicians, Engineer, materials, nurse practitioners or other licensed health care professionals (the Practitioner), to provide me with telemedicine health care services (the "Services") as deemed necessary by the treating Practitioner. I acknowledge and consent to receive the Services by the Practitioner via telemedicine. I understand that the telemedicine visit will involve communicating with the Practitioner through live audiovisual communication technology and the disclosure of certain medical information by electronic transmission. I acknowledge that I have been given the opportunity to request an in-person assessment or other available alternative prior to the telemedicine visit and am voluntarily participating in the telemedicine visit.  I understand that I have the right to withhold or withdraw my consent to the use of telemedicine in the course of my care at any time, without affecting my right to future care or treatment, and that the Practitioner or I may terminate the telemedicine visit at any time. I understand that I have the right to inspect all information obtained and/or recorded in the course of the telemedicine visit and may receive copies of available information for a reasonable fee.  I understand that some of the potential risks of receiving the Services via telemedicine include:  Marland Kitchen Delay or interruption in medical evaluation due to technological equipment failure or disruption; . Information transmitted may not be sufficient (e.g. poor resolution of images) to allow for appropriate medical decision making by the Practitioner;  and/or  . In rare instances, security protocols could fail, causing a breach of personal health information.  Furthermore, I acknowledge that it is my responsibility to provide information about my medical history, conditions and care that is complete and accurate to the best of my ability. I acknowledge that Practitioner's advice, recommendations, and/or decision may be based on factors not within their control, such as incomplete or inaccurate data provided by me or distortions of diagnostic images or specimens that may result from electronic transmissions. I understand that the practice of medicine is not an exact science and that Practitioner makes no warranties or guarantees regarding treatment outcomes. I acknowledge that a copy of this consent can be made available to me via my patient portal ([facility]), or I can request a printed copy by calling the office of [facility].    I understand that my insurance will be billed for this visit.   I have read or had this consent read to me. . I understand the contents of this consent, which adequately explains the benefits and risks of the Services being provided via telemedicine.  . I have been provided ample opportunity to ask questions regarding this consent and the Services and have had my questions answered to my satisfaction. . I give my informed consent for the services to be provided through the use of telemedicine in my medical care

## 2019-10-15 ENCOUNTER — Other Ambulatory Visit: Payer: Self-pay | Admitting: Gastroenterology

## 2019-10-15 ENCOUNTER — Encounter: Payer: Self-pay | Admitting: Family Medicine

## 2019-10-15 ENCOUNTER — Telehealth (INDEPENDENT_AMBULATORY_CARE_PROVIDER_SITE_OTHER): Payer: Medicare Other | Admitting: Family Medicine

## 2019-10-15 VITALS — BP 94/61 | HR 92 | Ht 64.0 in

## 2019-10-15 DIAGNOSIS — R0600 Dyspnea, unspecified: Secondary | ICD-10-CM

## 2019-10-15 DIAGNOSIS — I951 Orthostatic hypotension: Secondary | ICD-10-CM | POA: Diagnosis not present

## 2019-10-15 DIAGNOSIS — R55 Syncope and collapse: Secondary | ICD-10-CM | POA: Diagnosis not present

## 2019-10-15 DIAGNOSIS — R0609 Other forms of dyspnea: Secondary | ICD-10-CM

## 2019-10-15 DIAGNOSIS — K219 Gastro-esophageal reflux disease without esophagitis: Secondary | ICD-10-CM

## 2019-10-15 NOTE — Progress Notes (Signed)
Virtual Visit via Telephone Note   This visit type was conducted due to national recommendations for restrictions regarding the COVID-19 Pandemic (e.g. social distancing) in an effort to limit this patient's exposure and mitigate transmission in our community.  Due to her co-morbid illnesses, this patient is at least at moderate risk for complications without adequate follow up.  This format is felt to be most appropriate for this patient at this time.  The patient did not have access to video technology/had technical difficulties with video requiring transitioning to audio format only (telephone).  All issues noted in this document were discussed and addressed.  No physical exam could be performed with this format.  Please refer to the patient's chart for her  consent to telehealth for [facility].    Date:  10/15/2019   ID:  Claire [name], DOB 12/29/1970, MRN [id number] The patient was identified using 2 identifiers.  Patient Location: Home Provider Location: Office/Clinic  PCP:  Dara Lords, NP  Cardiologist:  No primary care provider on file.  Electrophysiologist:  None   Evaluation Performed:  Follow-Up Visit  Chief Complaint: Follow-up near syncope.  History of Present Illness:    Claire [name] is a 49 y.o. female with history as near syncope, persistent asthma, seizures, fibromyalgia, obstructive sleep apnea.  She was evaluated by Dr. Bronson Ing June 23, 2019 for near syncopal episode.  EKG was reviewed by Dr. Bronson Ing demonstrating sinus tachycardia rate of 112 with anteroseptal Q waves and nonspecific ST segment and T wave abnormalities.  She had been having more episodes of near syncope in the past year.  She denied any chest pain.  She had been having episodes of lightheadedness and near syncope for the past 5 to 6 years.  Has chronic exertional dyspnea related to asthma.  She denies any PND, orthopnea or lower extremity edema.  She was drinking approximately 74 ounces  of water daily.  Orthostatic vitals were checked during that office visit and she was found to have orthostatic hypotension.  Her Micardis dosage was reduced to 40 mg.  An echocardiogram was ordered to evaluate cardiac structure, function, regional wall motion.  Echo was normal.  See report below  Talk with the patient by phone today.  States she is feeling much better since decreasing doses of myocarditis.  She denies any significant issues with orthostatic hypotension, near syncopal episode.  Her blood pressure was recorded at 94/61 this afternoon.  She states she checked it earlier today and it was 104/71.  She states that her pain management provider her blood pressure was a little low but she was not symptomatic.  She denies any CVA or TIA-like symptoms, PND or orthopnea, lower extremity edema.  The patient does not have symptoms concerning for COVID-19 infection (fever, chills, cough, or new shortness of breath).    Past Medical History:  Diagnosis Date  . Acid reflux   . Allergy    pollen, dust  . Anxiety   . Arthritis   . Asthma   . Carpal tunnel syndrome    bilateral  . DDD (degenerative disc disease), cervical   . Depression   . Fibromyalgia   . Hypertension   . Migraine   . Palpitations   . Panic attacks   . PTSD (post-traumatic stress disorder)   . Seizures ([place])    unknown etiology; last seizure was 2 years ago; on meds.  . Syncope and collapse   . Tennis elbow   . Ulcer   .  Vertigo    Past Surgical History:  Procedure Laterality Date  . BACK SURGERY  1993  . BIOPSY  10/21/2016   Procedure: BIOPSY;  Surgeon: Daneil Dolin, MD;  [facility];  Service: Endoscopy;;  gastric, esophagus  . CHOLECYSTECTOMY    . ESOPHAGOGASTRODUODENOSCOPY (EGD) WITH PROPOFOL N/A 10/21/2016   Procedure: ESOPHAGOGASTRODUODENOSCOPY (EGD) WITH PROPOFOL;  Surgeon: Daneil Dolin, MD;  [facility];  Service: Endoscopy;  Laterality: N/A;  9:30am  . GALLBLADDER SURGERY      . SHOULDER SURGERY Right   . SPINE SURGERY  1993   lumbar disc surgery     Current Meds  Medication Sig  . albuterol (PROVENTIL HFA;VENTOLIN HFA) 108 (90 Base) MCG/ACT inhaler Inhale 1-2 puffs into the lungs every 6 (six) hours as needed for wheezing or shortness of breath.   Marland Kitchen albuterol (PROVENTIL) (2.5 MG/3ML) 0.083% nebulizer solution Take 3 mLs (2.5 mg total) by nebulization every 4 (four) hours as needed for wheezing or shortness of breath.  . budesonide-formoterol (SYMBICORT) 160-4.5 MCG/ACT inhaler Inhale 2 puffs into the lungs 2 (two) times daily.  . busPIRone (BUSPAR) 15 MG tablet Take 1 tablet (15 mg total) by mouth 3 (three) times daily.  . diphenhydrAMINE (BENADRYL) 25 MG tablet Take 50 mg by mouth as needed.   Marland Kitchen escitalopram (LEXAPRO) 20 MG tablet Take 1 tablet (20 mg total) by mouth daily.  . Gabapentin, Once-Daily, (GRALISE PO) Take by mouth. gralise '600mg'$  3 tablets at night  . ibuprofen (ADVIL,MOTRIN) 600 MG tablet Take 1 tablet (600 mg total) by mouth every 6 (six) hours as needed.  Marland Kitchen ipratropium-albuterol (DUONEB) 0.5-2.5 (3) MG/3ML SOLN   . levETIRAcetam (KEPPRA) 500 MG tablet Take 1 tablet (500 mg total) by mouth 2 (two) times daily.  Marland Kitchen LINZESS 290 MCG CAPS capsule TAKE 1 CAPSULE BY MOUTH ONCE DAILY BEFORE BREAKFAST  . methocarbamol (ROBAXIN) 500 MG tablet Take 500 mg by mouth 3 (three) times daily as needed.  . metoprolol succinate (TOPROL-XL) 25 MG 24 hr tablet Take 1 tablet (25 mg total) by mouth daily.  . mirtazapine (REMERON) 45 MG tablet Take 1 tablet (45 mg total) by mouth at bedtime.  . montelukast (SINGULAIR) 10 MG tablet Take 10 mg by mouth at bedtime.  . Multiple Vitamin (MULTIVITAMIN WITH MINERALS) TABS tablet Take 2 tablets by mouth daily.   Marland Kitchen NARCAN 4 MG/0.1ML LIQD nasal spray kit Place 1 spray into the nose as directed.  Marland Kitchen omeprazole (PRILOSEC) 40 MG capsule TAKE 1 CAPSULE BY MOUTH ONCE DAILY BEFORE BREAKFAST  . oxyCODONE-acetaminophen (PERCOCET) 10-325  MG tablet Take 1 tablet by mouth every 6 (six) hours as needed.  Marland Kitchen QUEtiapine (SEROQUEL) 50 MG tablet Take 1 tablet (50 mg total) by mouth at bedtime.  Marland Kitchen SAVELLA 50 MG TABS tablet Take 50 mg by mouth 2 (two) times daily.  Marland Kitchen telmisartan (MICARDIS) 40 MG tablet Take 40 mg by mouth daily.  Marland Kitchen tiZANidine (ZANAFLEX) 2 MG tablet Take 2 mg by mouth 3 (three) times daily.   Current Facility-Administered Medications for the 10/15/19 encounter (Telemedicine) with Verta Ellen., NP  Medication  . dupilumab (DUPIXENT) prefilled syringe 400 mg     Allergies:   Baclofen, Gluten meal, Claritin [loratadine], Cymbalta [duloxetine hcl], Penicillins, Sulfa antibiotics, and Ultram [tramadol]   Social History   Tobacco Use  . Smoking status: Never Smoker  . Smokeless tobacco: Never Used  Vaping Use  . Vaping Use: Never used  Substance Use Topics  .  Alcohol use: Not Currently    Comment: drink occasionally  . Drug use: No     Family Hx: The patient's family history includes Alcohol abuse in her father; Arthritis in her mother; Bronchitis in her mother; COPD in her mother; Colon cancer in her maternal grandmother; Depression in her mother; Early death in her father; Heart disease in her mother; Hypertension in her mother; PKU in her son.  ROS:   Please see the history of present illness.    All other systems reviewed and are negative.   Prior CV studies:   The following studies were reviewed today:  Echocardiogram Jul 21, 2019.  1. Left ventricular ejection fraction, by estimation, is 60 to 65%. The left ventricle has normal function. The left ventricle has no regional wall motion abnormalities. Left ventricular diastolic parameters were normal. 2. Right ventricular systolic function is normal. The right ventricular size is normal. There is normal pulmonary artery systolic pressure. 3. The mitral valve is normal in structure. No evidence of mitral valve regurgitation. No evidence of mitral  stenosis. 4. The aortic valve was not well visualized. Aortic valve regurgitation is not visualized. No aortic stenosis is present.  Labs/Other Tests and Data Reviewed:    EKG:    Recent Labs: 05/13/2019: Hemoglobin 14.0; Platelets 427   Recent Lipid Panel No results found for: CHOL, TRIG, HDL, CHOLHDL, LDLCALC, LDLDIRECT  Wt Readings from Last 3 Encounters:  08/24/19 176 lb (79.8 kg)  07/13/19 171 lb (77.6 kg)  06/23/19 174 lb (78.9 kg)     Objective:    Vital Signs:  BP (!) 94/61   Pulse 92   Ht '5\' 4"'$  (1.626 m)   BMI 30.21 kg/m    VITAL SIGNS:  reviewed   Normal speech pattern and responding appropriately.  No dyspnea cough or wheezing noted during phone conversation.  ASSESSMENT & PLAN:    1. Near syncope Patient denies any recent syncopal or near syncopal episodes.  States she is feeling much better since Micardis dose was decreased to 40 mg daily.  2. Orthostatic hypotension Recorded blood pressure today is 94/61.  She states she checked her blood pressure earlier this morning it was 104/71.  3. DOE (dyspnea on exertion) Patient states her episodes of dyspnea come and go.  She states the symptoms are more prominent during issues with her asthma.  States she recent the had allergy testing and allergic to several things including gluten which she has completely cut out of her diet.  COVID-19 Education: The signs and symptoms of COVID-19 were discussed with the patient and how to seek care for testing (follow up with PCP or arrange E-visit).  The importance of social distancing was discussed today.  Time:   Today, I have spent 10 minutes minutes with the patient with telehealth technology discussing the above problems.     Medication Adjustments/Labs and Tests Ordered: Current medicines are reviewed at length with the patient today.  Concerns regarding medicines are outlined above.   Tests Ordered: No orders of the defined types were placed in this  encounter.   Medication Changes: No orders of the defined types were placed in this encounter.   Follow Up:  Either virtual or in person prn  Signed, Verta Ellen, NP  10/15/2019 3:18 PM    [place]

## 2019-10-15 NOTE — Patient Instructions (Addendum)
Medication Instructions:   Your physician recommends that you continue on your current medications as directed. Please refer to the Current Medication list given to you today.  Labwork:  NONE  Testing/Procedures:  NONE  Follow-Up:  Your physician recommends that you schedule a follow-up appointment in: as needed.   Any Other Special Instructions Will Be Listed Below (If Applicable).  If you need a refill on your cardiac medications before your next appointment, please call your pharmacy. 
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
## 2019-10-25 ENCOUNTER — Other Ambulatory Visit: Payer: Self-pay

## 2019-10-25 ENCOUNTER — Encounter (HOSPITAL_COMMUNITY): Payer: Self-pay | Admitting: Psychiatry

## 2019-10-25 ENCOUNTER — Telehealth (INDEPENDENT_AMBULATORY_CARE_PROVIDER_SITE_OTHER): Payer: Medicare Other | Admitting: Psychiatry

## 2019-10-25 DIAGNOSIS — F411 Generalized anxiety disorder: Secondary | ICD-10-CM | POA: Diagnosis not present

## 2019-10-25 DIAGNOSIS — F410 Panic disorder [episodic paroxysmal anxiety] without agoraphobia: Secondary | ICD-10-CM

## 2019-10-25 MED ORDER — QUETIAPINE FUMARATE 50 MG PO TABS: 50.0000 mg | ORAL_TABLET | Freq: Every day | ORAL | 0 refills | Status: DC

## 2019-10-25 MED ORDER — ESCITALOPRAM OXALATE 20 MG PO TABS: 20.0000 mg | ORAL_TABLET | Freq: Every day | ORAL | 0 refills | Status: DC

## 2019-10-25 MED ORDER — MIRTAZAPINE 45 MG PO TABS: 45.0000 mg | ORAL_TABLET | Freq: Every day | ORAL | 0 refills | Status: DC

## 2019-10-25 MED ORDER — BUSPIRONE HCL 15 MG PO TABS: 15.0000 mg | ORAL_TABLET | Freq: Three times a day (TID) | ORAL | 0 refills | Status: DC

## 2019-10-25 NOTE — Patient Instructions (Signed)
1. Continue lexapro 20 mg daily 2.Continuemirtazapine 45mg  at night 3.Continuequetiapine50mg  at night  4. Continue Buspar 15 mg three times day 5. Next appointment:10/11 at 1:20

## 2019-11-15 ENCOUNTER — Other Ambulatory Visit: Payer: Self-pay | Admitting: Allergy

## 2019-11-17 ENCOUNTER — Other Ambulatory Visit: Payer: Self-pay | Admitting: Allergy

## 2019-11-26 ENCOUNTER — Telehealth (HOSPITAL_COMMUNITY): Payer: Medicare Other | Admitting: Psychiatry

## 2019-12-16 ENCOUNTER — Other Ambulatory Visit (HOSPITAL_COMMUNITY): Payer: Self-pay | Admitting: Psychiatry

## 2019-12-16 DIAGNOSIS — F410 Panic disorder [episodic paroxysmal anxiety] without agoraphobia: Secondary | ICD-10-CM

## 2019-12-16 DIAGNOSIS — F411 Generalized anxiety disorder: Secondary | ICD-10-CM

## 2019-12-16 MED ORDER — BUSPIRONE HCL 15 MG PO TABS: 15.0000 mg | ORAL_TABLET | Freq: Three times a day (TID) | ORAL | 0 refills | Status: DC

## 2019-12-17 ENCOUNTER — Other Ambulatory Visit: Payer: Self-pay

## 2019-12-17 ENCOUNTER — Other Ambulatory Visit: Payer: Self-pay | Admitting: Allergy

## 2019-12-22 ENCOUNTER — Other Ambulatory Visit (HOSPITAL_COMMUNITY): Payer: Self-pay | Admitting: Psychiatry

## 2019-12-22 ENCOUNTER — Telehealth (HOSPITAL_COMMUNITY): Payer: Self-pay | Admitting: *Deleted

## 2019-12-22 DIAGNOSIS — F410 Panic disorder [episodic paroxysmal anxiety] without agoraphobia: Secondary | ICD-10-CM

## 2019-12-22 DIAGNOSIS — F411 Generalized anxiety disorder: Secondary | ICD-10-CM

## 2019-12-22 MED ORDER — QUETIAPINE FUMARATE 50 MG PO TABS: 50.0000 mg | ORAL_TABLET | Freq: Every day | ORAL | 0 refills | Status: DC

## 2019-12-22 MED ORDER — BUSPIRONE HCL 15 MG PO TABS: 15.0000 mg | ORAL_TABLET | Freq: Three times a day (TID) | ORAL | 0 refills | Status: DC

## 2019-12-22 MED ORDER — ESCITALOPRAM OXALATE 20 MG PO TABS: 20.0000 mg | ORAL_TABLET | Freq: Every day | ORAL | 0 refills | Status: DC

## 2019-12-22 MED ORDER — MIRTAZAPINE 45 MG PO TABS: 45.0000 mg | ORAL_TABLET | Freq: Every day | ORAL | 0 refills | Status: DC
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
## 2019-12-22 NOTE — Telephone Encounter (Signed)
Ordered. Please contact [facility] in [place], and cancel all remaining refill from Dr. Harrington Challenger and me.

## 2019-12-22 NOTE — Telephone Encounter (Signed)
[facility] rep called and [facility] stating they requested 4 scripts and have not received a response and was trying to get patient medications filled and mailed out to her. Carol from [facility] did not leave the names of the medications being requested.   Staff called patient and spoke with her and she stated she switched over to [facility] and no longer using [facility]). Per pt she would like provider to please re-route her scripts to this pharmacy. Per pt she have not picked up any of her recent script from [facility] recently.

## 2019-12-22 NOTE — Telephone Encounter (Signed)
Called pharmacy and spoke with Mikki Santee and informed him with what provider stated and he stated he will take care of it and deactivate those scripts.   Staff informed patient that provider sent her scripts to her new pharmacy [facility] and she verbalized understanding.

## 2019-12-27 ENCOUNTER — Telehealth (INDEPENDENT_AMBULATORY_CARE_PROVIDER_SITE_OTHER): Payer: Medicare Other | Admitting: Psychiatry

## 2019-12-27 ENCOUNTER — Other Ambulatory Visit: Payer: Self-pay

## 2019-12-27 ENCOUNTER — Encounter (HOSPITAL_COMMUNITY): Payer: Self-pay | Admitting: Psychiatry

## 2019-12-27 DIAGNOSIS — F410 Panic disorder [episodic paroxysmal anxiety] without agoraphobia: Secondary | ICD-10-CM | POA: Diagnosis not present

## 2019-12-27 DIAGNOSIS — F411 Generalized anxiety disorder: Secondary | ICD-10-CM

## 2019-12-27 MED ORDER — ESCITALOPRAM OXALATE 20 MG PO TABS: 20.0000 mg | ORAL_TABLET | Freq: Every day | ORAL | 0 refills | Status: DC

## 2019-12-27 MED ORDER — MIRTAZAPINE 45 MG PO TABS: 45.0000 mg | ORAL_TABLET | Freq: Every day | ORAL | 0 refills | Status: DC

## 2019-12-27 MED ORDER — QUETIAPINE FUMARATE 50 MG PO TABS: 50.0000 mg | ORAL_TABLET | Freq: Every day | ORAL | 0 refills | Status: DC

## 2019-12-27 NOTE — Patient Instructions (Signed)
1. Continue lexapro 20 mg daily 2.Continuemirtazapine 45mg  at night 3.Continuequetiapine50mg  at night 4. DecreaseBuspar10 mg three times day for one week, then 5 mg three times for one week, then discontinue 5. Next appointment:1/10 at 1:20

## 2020-01-20 ENCOUNTER — Telehealth: Payer: Self-pay

## 2020-01-20 NOTE — Telephone Encounter (Signed)
Decline both quetiapnie and lexapro. It was ordered already with refill, and she should have enough meds until the next visit.

## 2020-01-20 NOTE — Telephone Encounter (Signed)
received fax requesting a refill also on the quietiapine fumarate 50mg     QUEtiapine (SEROQUEL) 50 MG tablet Medication Date: 12/27/2019 Department: [facility] Ordering/Authorizing: Norman Clay, MD  Order Providers  Prescribing Provider Encounter Provider  Norman Clay, MD Norman Clay, MD  Outpatient Medication Detail   Disp Refills Start End   QUEtiapine (SEROQUEL) 50 MG tablet 90 tablet 0 03/21/2020    Sig - Route: Take 1 tablet (50 mg total) by mouth at bedtime. - Oral   Sent to pharmacy as: QUEtiapine (SEROQUEL) 50 MG tablet   E-Prescribing Status: Receipt confirmed by pharmacy (12/27/2019 1:33 PM EDT)   Associated Diagnoses  Generalized anxiety disorder - Primary     Pharmacy  [place], [place]

## 2020-01-20 NOTE — Telephone Encounter (Signed)
received fax requesting refill on escitalopram 20mg    escitalopram (LEXAPRO) 20 MG tablet Medication Date: 12/27/2019 Department: [facility] Ordering/Authorizing: Norman Clay, MD  Order Providers  Prescribing Provider Encounter Provider  Norman Clay, MD Norman Clay, MD  Outpatient Medication Detail   Disp Refills Start End   escitalopram (LEXAPRO) 20 MG tablet 90 tablet 0 03/21/2020    Sig - Route: Take 1 tablet (20 mg total) by mouth daily. - Oral   Sent to pharmacy as: escitalopram (LEXAPRO) 20 MG tablet   E-Prescribing Status: Receipt confirmed by pharmacy (12/27/2019 1:33 PM EDT)   Associated Diagnoses  Generalized anxiety disorder - Primary     Panic disorder     Pharmacy  [place], [place]

## 2020-01-24 ENCOUNTER — Encounter: Payer: Medicare Other | Admitting: Obstetrics & Gynecology

## 2020-02-23 ENCOUNTER — Ambulatory Visit: Payer: Medicare Other | Admitting: Neurology
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
## 2020-03-27 ENCOUNTER — Telehealth: Payer: Medicare Other | Admitting: Psychiatry

## 2020-03-27 ENCOUNTER — Telehealth (HOSPITAL_COMMUNITY): Payer: Medicare Other | Admitting: Psychiatry

## 2020-03-27 ENCOUNTER — Telehealth: Payer: Self-pay | Admitting: Psychiatry

## 2020-03-27 ENCOUNTER — Other Ambulatory Visit: Payer: Self-pay

## 2020-03-27 NOTE — Telephone Encounter (Signed)
Called the patient  twice for appointment scheduled today. The patient did not answer the phone. Left voice message to contact the office.  

## 2020-03-30 ENCOUNTER — Telehealth: Payer: Self-pay | Admitting: Allergy

## 2020-03-30 NOTE — Telephone Encounter (Signed)
Patient called and made appointment in [place] but she is sick and needs to have nebulizer solution and 2 inhaler symbicort and albuterol called into [facility] in [place]. She has a appointment on 04/21/2020. [phone].

## 2020-03-30 NOTE — Telephone Encounter (Signed)
Are you ok with these medications being refilled until her appointment?

## 2020-03-31 MED ORDER — ALBUTEROL SULFATE HFA 108 (90 BASE) MCG/ACT IN AERS: INHALATION_SPRAY | RESPIRATORY_TRACT | 0 refills | Status: DC

## 2020-03-31 MED ORDER — ALBUTEROL SULFATE (2.5 MG/3ML) 0.083% IN NEBU: 2.5000 mg | INHALATION_SOLUTION | RESPIRATORY_TRACT | 1 refills | Status: DC | PRN

## 2020-03-31 MED ORDER — SYMBICORT 160-4.5 MCG/ACT IN AERO: INHALATION_SPRAY | RESPIRATORY_TRACT | 0 refills | Status: DC

## 2020-03-31 NOTE — Telephone Encounter (Signed)
No problem at all.  Salvatore Marvel, MD [facility]

## 2020-03-31 NOTE — Telephone Encounter (Signed)
Refills sent and patient notified.

## 2020-04-21 ENCOUNTER — Ambulatory Visit: Payer: Medicare Other | Admitting: Allergy & Immunology

## 2020-05-02 NOTE — Patient Instructions (Signed)
Severe persistent asthma Get STAT PA/Lateral chest xray due to productive cough Start prednisone 10 mg taking 1 tablet twice a day for 3 days, then on the fourth day take 2 tablets in the morning, then on the fifth day take 1 tablet and stop Re-start Symbicort 160/4.5 mcg 2 puffs twice a day with spacer to help prevent cough and wheeze Re-start Singulair 10 mg once a day to help prevent cough and wheeze. Patient cautioned that rarely some children/adults can experience behavioral changes after beginning montelukast. These side effects are rare, however, if you notice any change, notify the clinic and discontinue montelukast. May use albuterol 2 puffs every 4-6 hours as needed for cough, wheeze, tightness in chest, shortness of breath.  Also may use albuterol 2 puffs 5 to 15 minutes prior to exercise. StartDupixent injections every 2 weeks. We will get you the paper work. Asthma control goals:   Full participation in all desired activities (may need albuterol before activity)  Albuterol use two time or less a week on average (not counting use with activity)  Cough interfering with sleep two time or less a month  Oral steroids no more than once a year  No hospitalizations  Allergic rhinitis(cat dander, tree pollen, weed pollen, grass, and dog) Start RyVent 1 tablet twice a day as needed  Anaphylaxis due to food Avoid wheat, rye, barley, oat(gluten products). In case of an allergic reaction, give Benadryl 4 teaspoonfuls every 4 hours, and if life-threatening symptoms occur, inject with EpiPen 0.3 mg.  Adverse drug effect Continue to avoid loratadine (Claritin)  Please let us know if this treatment plan is not working well for you Schedule a follow-up appointment in 2 weeks

## 2020-05-03 ENCOUNTER — Encounter: Payer: Self-pay | Admitting: Family

## 2020-05-03 ENCOUNTER — Ambulatory Visit (INDEPENDENT_AMBULATORY_CARE_PROVIDER_SITE_OTHER): Payer: Medicare Other | Admitting: Family

## 2020-05-03 ENCOUNTER — Other Ambulatory Visit: Payer: Self-pay

## 2020-05-03 ENCOUNTER — Ambulatory Visit (HOSPITAL_COMMUNITY)
Admission: RE | Admit: 2020-05-03 | Discharge: 2020-05-03 | Disposition: A | Discharge status: Home / Self Care | Payer: Medicare Other | Source: Ambulatory Visit | Attending: Family | Admitting: Family

## 2020-05-03 VITALS — BP 128/72 | HR 84 | Temp 98.1°F | Resp 17

## 2020-05-03 DIAGNOSIS — T7800XA Anaphylactic reaction due to unspecified food, initial encounter: Secondary | ICD-10-CM

## 2020-05-03 DIAGNOSIS — J3089 Other allergic rhinitis: Secondary | ICD-10-CM

## 2020-05-03 DIAGNOSIS — R059 Cough, unspecified: Secondary | ICD-10-CM | POA: Insufficient documentation

## 2020-05-03 DIAGNOSIS — T7800XD Anaphylactic reaction due to unspecified food, subsequent encounter: Secondary | ICD-10-CM | POA: Diagnosis not present

## 2020-05-03 DIAGNOSIS — T50905D Adverse effect of unspecified drugs, medicaments and biological substances, subsequent encounter: Secondary | ICD-10-CM

## 2020-05-03 DIAGNOSIS — J4551 Severe persistent asthma with (acute) exacerbation: Secondary | ICD-10-CM

## 2020-05-03 MED ORDER — ALBUTEROL SULFATE HFA 108 (90 BASE) MCG/ACT IN AERS: INHALATION_SPRAY | RESPIRATORY_TRACT | 1 refills | Status: DC

## 2020-05-03 MED ORDER — MONTELUKAST SODIUM 10 MG PO TABS
10.0000 mg | ORAL_TABLET | Freq: Every day | ORAL | 5 refills | Status: DC
Start: 2020-05-03 — End: 2020-12-01

## 2020-05-03 MED ORDER — SYMBICORT 160-4.5 MCG/ACT IN AERO: INHALATION_SPRAY | RESPIRATORY_TRACT | 3 refills | Status: DC

## 2020-05-03 NOTE — Progress Notes (Signed)
[place], [place] [address] Dept: [phone]  FOLLOW UP NOTE  Patient ID: Claire [name], female    DOB: [birth year]  Age: 50 y.o. MRN: [id number] Date of Office Visit: 05/03/2020  Assessment  Chief Complaint: Asthma  HPI Claire [name] is a 50 year old female who presents today for follow-up of severe persistent asthma, allergic rhinitis, anaphylaxis due to food, and adverse drug effect.  She was last seen on June 11, 2019 by Dr. Nelva Bush.  Severe persistent asthma is reported as not well controlled with albuterol as needed.  She reports that she lost her Symbicort 160/4.5 mcg a couple of weeks ago.  She thinks that her cat knocked it somewhere and she is unable to find it.  She also has been out of Singulair 10 mg for the past 6 to 7 months.  She has been using her albuterol inhaler at least once a day.  She is interested in starting Dupixent injections.  She cannot find the paperwork needed to start these.  She reports a productive cough with light yellow sputum, wheezing, nocturnal awakenings, tightness in her chest, and shortness of breath.  Since her last office visit she has not required any trips to the emergency room or urgent care due to breathing problems.  She has had 1 round of steroids last month due to breathing problems.  Allergic rhinitis is reported as not well controlled with Benadryl as needed.  She reports clear rhinorrhea, nasal congestion, postnasal drip, and sore throat for the past 3 months.  She denies any sinus tenderness.  She also reports that she has had a dry mouth for quite a while now feels that she cannot get enough water.  She has not spoken with her primary care physician about this.  Instructed that she needs to discuss this with her primary care physician.  Since her last office visit she reports that there was one time in [place] where she ordered grilled nuggets from [employer] and instantly began to throw up.  She denied any other  concomitant symptoms.  She did not use her epinephrine autoinjector device.  She wonders if the employee did not change gloves.  She has since then had grilled chicken with no problems.  She continues to avoid Claritin without any accidental ingestion.   Drug Allergies:  Allergies  Allergen Reactions  . Baclofen Shortness Of Breath    Swollen ankles  . Gluten Meal   . Claritin [Loratadine] Other (See Comments)    shaking  . Cymbalta [Duloxetine Hcl] Hives    "Hives, forgot who I was"  . Penicillins Hives    Has patient had a PCN reaction causing immediate rash, facial/tongue/throat swelling, SOB or lightheadedness with hypotension: Unknown Has patient had a PCN reaction causing severe rash involving mucus membranes or skin necrosis: Yes Has patient had a PCN reaction that required hospitalization: Unknown Has patient had a PCN reaction occurring within the last 10 years: Unknown If all of the above answers are "NO", then may proceed with Cephalosporin use.  . Sulfa Antibiotics Hives  . Ultram [Tramadol] Other (See Comments)    Dizzy    Review of Systems: Review of Systems  Constitutional: Positive for chills. Negative for fever.  HENT:       Reports clear rhinorrhea, nasal congestion, and postnasal drip.  Denies sinus tender.  Eyes:       Reports itchy watery eyes.  She has been out of refresh eyedrops and needs to get some  Respiratory: Positive for cough, shortness of breath and wheezing.   Cardiovascular: Positive for palpitations. Negative for chest pain.       Reports that she feels her heart beating out of her chest at times.  She reports that she has seen a cardiologist and was told that there is nothing wrong.  Gastrointestinal: Negative for heartburn.  Genitourinary: Negative for dysuria.  Skin: Negative for itching and rash.  Neurological: Positive for headaches.       History of migraines  Endo/Heme/Allergies: Positive for environmental allergies.    Physical  Exam: BP 128/72 (BP Location: Right Arm, Patient Position: Sitting, Cuff Size: Normal)   Pulse 84   Temp 98.1 F (36.7 C) (Temporal)   Resp 17   SpO2 98%    Physical Exam Constitutional:      Appearance: Normal appearance.  HENT:     Head: Normocephalic and atraumatic.     Right Ear: Tympanic membrane, ear canal and external ear normal.     Left Ear: Tympanic membrane, ear canal and external ear normal.     Nose: Nose normal.     Mouth/Throat:     Mouth: Mucous membranes are moist.     Pharynx: Oropharynx is clear.  Eyes:     Conjunctiva/sclera: Conjunctivae normal.  Cardiovascular:     Rate and Rhythm: Regular rhythm.     Heart sounds: Normal heart sounds.  Pulmonary:     Effort: Pulmonary effort is normal.     Breath sounds: Normal breath sounds.     Comments: Lungs clear to auscultation Musculoskeletal:     Cervical back: Neck supple.  Skin:    General: Skin is warm.  Neurological:     Mental Status: She is alert and oriented to person, place, and time.  Psychiatric:        Mood and Affect: Mood normal.        Behavior: Behavior normal.        Thought Content: Thought content normal.        Judgment: Judgment normal.     Diagnostics: FVC 2.53 L, FEV1 2.13 L.  Predicted FVC 3.54 L, FEV1 2.80 L.  Spirometry indicates mild restriction.  Status post bronchodilator response shows FVC 2.83 L, FEV1 2.42 L.  Spirometry indicates normal ventilatory function with greater than 200 cc improvement in FEV1  Assessment and Plan: 1. Severe persistent asthma with acute exacerbation   2. Non-seasonal allergic rhinitis due to other allergic trigger   3. Anaphylaxis due to food   4. Adverse effect of drug, subsequent encounter   5. Cough     Meds ordered this encounter  Medications  . SYMBICORT 160-4.5 MCG/ACT inhaler    Sig: Inhale two puffs twice daily to prevent cough or wheeze. Rinse mouth after use.    Dispense:  10.2 g    Refill:  3  . montelukast (SINGULAIR) 10 MG  tablet    Sig: Take 1 tablet (10 mg total) by mouth at bedtime.    Dispense:  30 tablet    Refill:  5  . albuterol (VENTOLIN HFA) 108 (90 Base) MCG/ACT inhaler    Sig: Inhale two puffs every 4-6 hours if needed for cough or wheeze.    Dispense:  18 g    Refill:  1    Patient Instructions  Severe persistent asthma Get STAT PA/Lateral chest xray due to productive cough Start prednisone 10 mg taking 1 tablet twice a day for 3 days, then on the fourth day  take 2 tablets in the morning, then on the fifth day take 1 tablet and stop Re-start Symbicort 160/4.5 mcg 2 puffs twice a day with spacer to help prevent cough and wheeze Re-start Singulair 10 mg once a day to help prevent cough and wheeze. Patient cautioned that rarely some children/adults can experience behavioral changes after beginning montelukast. These side effects are rare, however, if you notice any change, notify the clinic and discontinue montelukast. May use albuterol 2 puffs every 4-6 hours as needed for cough, wheeze, tightness in chest, shortness of breath.  Also may use albuterol 2 puffs 5 to 15 minutes prior to exercise. StartDupixent injections every 2 weeks. We will get you the paper work. Asthma control goals:   Full participation in all desired activities (may need albuterol before activity)  Albuterol use two time or less a week on average (not counting use with activity)  Cough interfering with sleep two time or less a month  Oral steroids no more than once a year  No hospitalizations  Allergic rhinitis(cat dander, tree pollen, weed pollen, grass, and dog) Start RyVent 1 tablet twice a day as needed  Anaphylaxis due to food Avoid wheat, rye, barley, oat(gluten products). In case of an allergic reaction, give Benadryl 4 teaspoonfuls every 4 hours, and if life-threatening symptoms occur, inject with EpiPen 0.3 mg.  Adverse drug effect Continue to avoid loratadine (Claritin)  Please let us know if this  treatment plan is not working well for you Schedule a follow-up appointment in 2 weeks   Return in about 2 weeks (around 05/17/2020), or if symptoms worsen or fail to improve.    Thank you for the opportunity to care for this patient.  Please do not hesitate to contact me with questions.  Althea Charon, FNP [facility]

## 2020-05-03 NOTE — Progress Notes (Signed)
Plesase let the patient know that her chest x-ray was clear. Go ahead with the plan that was discussed at your office visit today. Thank you!

## 2020-05-03 NOTE — Progress Notes (Signed)
Thank you :)

## 2020-05-04 ENCOUNTER — Telehealth: Payer: Self-pay | Admitting: *Deleted

## 2020-05-04 NOTE — Telephone Encounter (Signed)
Called patient and discussed [place] to get Dupixent. Will application to patient again

## 2020-05-04 NOTE — Telephone Encounter (Signed)
-----   Message from Althea Charon, [place] sent at 05/03/2020  2:57 PM EST ----- Wanting to start Dupixent injections. Was given 1 injection back in 2021.

## 2020-05-11 ENCOUNTER — Encounter: Payer: Self-pay | Admitting: Internal Medicine

## 2020-05-16 NOTE — Patient Instructions (Addendum)
Severe persistent asthma Continue Symbicort 160/4.5 mcg 2 puffs twice a day with spacer to help prevent cough and wheeze Continue Singulair 10 mg once a day to help prevent cough and wheeze. May use albuterol 2 puffs every 4-6 hours as needed for cough, wheeze, tightness in chest, shortness of breath.  Also may use albuterol 2 puffs 5 to 15 minutes prior to exercise. StartDupixent injections every 2 weeks. Sample given in office today. She will drop off her paperwork this [place] Asthma control goals:   Full participation in all desired activities (may need albuterol before activity)  Albuterol use two time or less a week on average (not counting use with activity)  Cough interfering with sleep two time or less a month  Oral steroids no more than once a year  No hospitalizations  Allergic rhinitis(cat dander, tree pollen, weed pollen, grass, and dog) Continue RyVent 1 tablet twice a day as needed  Anaphylaxis due to food Avoid wheat, rye, barley, oat(gluten products). In case of an allergic reaction, give Benadryl 4 teaspoonfuls every 4 hours, and if life-threatening symptoms occur, inject with EpiPen 0.3 mg.  Adverse drug effect Continue to avoid loratadine (Claritin)  Bring your medications with you to your next appoitment  Please let us know if this treatment plan is not working well for you Schedule a follow-up appointment in 2 months or sooner

## 2020-05-17 ENCOUNTER — Other Ambulatory Visit: Payer: Self-pay

## 2020-05-17 ENCOUNTER — Ambulatory Visit (INDEPENDENT_AMBULATORY_CARE_PROVIDER_SITE_OTHER): Payer: Medicare Other | Admitting: Family

## 2020-05-17 ENCOUNTER — Encounter: Payer: Self-pay | Admitting: Family

## 2020-05-17 VITALS — BP 110/78 | HR 106 | Temp 97.3°F | Resp 17 | Ht 75.98 in | Wt 138.0 lb

## 2020-05-17 DIAGNOSIS — T7800XD Anaphylactic reaction due to unspecified food, subsequent encounter: Secondary | ICD-10-CM

## 2020-05-17 DIAGNOSIS — J3089 Other allergic rhinitis: Secondary | ICD-10-CM | POA: Diagnosis not present

## 2020-05-17 DIAGNOSIS — T7800XA Anaphylactic reaction due to unspecified food, initial encounter: Secondary | ICD-10-CM

## 2020-05-17 DIAGNOSIS — J4550 Severe persistent asthma, uncomplicated: Secondary | ICD-10-CM

## 2020-05-17 DIAGNOSIS — T50905D Adverse effect of unspecified drugs, medicaments and biological substances, subsequent encounter: Secondary | ICD-10-CM

## 2020-05-17 MED ORDER — DUPILUMAB 300 MG/2ML ~~LOC~~ SOSY
300.0000 mg | PREFILLED_SYRINGE | Freq: Once | SUBCUTANEOUS | Status: AC
  Administered 2020-05-17: 300 mg via SUBCUTANEOUS

## 2020-05-17 NOTE — Progress Notes (Signed)
[place], [place] [address] Dept: [phone]  FOLLOW UP NOTE  Patient ID: AMALEE OLSEN, female    DOB: [birth year]  Age: 50 y.o. MRN: [id number] Date of Office Visit: 05/17/2020  Assessment  Chief Complaint: Asthma  HPI Claire [name]  is a 50 year old female that presents today for follow up of severe persistent asthma, allergic rhinitis, anaphylaxis due to food, and adverse drug effect. She was last seen on 05/03/2020 by Althea Charon, FNP   Severe persistent asthma is reported as moderatly controlled with Symbicort 160/4.5 mcg 2 puffs twice a day with a spacer and possibly Singulair 10 mg once a day.  She is uncertain if she has this medication.  She reports that she takes so many medications that she is not sure what she is taking.  Instructed her to bring her medications with her to her next office visit.  She reports that her breathing is better since her last office visit.  She reports some wheezing at night, tightness in her chest every day, and shortness of breath a couple times a week.  She is using her albuterol 2-3 times a week which is better than what it has been.  She denies any coughing and nocturnal awakenings.  Since her last office visit she has not required any trips to the emergency room or urgent care due to breathing problems and she finished the prednisone taper that we gave her at her last office visit.  She is interested in Dupixent injections and is agreeable to getting a sample Dupixent injection today while in the office.  Allergic rhinitis is reported as moderately controlled. She is not certain what medications she is taking. She does not like to use nose sprays or eye drops. She reports nasal congestion at night and contributes this to her cat sleeping around her neck and being allergic to cats. She also reports post nasal drip and denies rhinorrhea.  She continues to avoid wheat, rye, barley, oat (gluten products) without any problems or  use of her Epi Pen. She also continues to avoid loratadine.   Drug Allergies:  Allergies  Allergen Reactions  . Baclofen Shortness Of Breath    Swollen ankles  . Gluten Meal   . Claritin [Loratadine] Other (See Comments)    shaking  . Cymbalta [Duloxetine Hcl] Hives    "Hives, forgot who I was"  . Penicillins Hives    Has patient had a PCN reaction causing immediate rash, facial/tongue/throat swelling, SOB or lightheadedness with hypotension: Unknown Has patient had a PCN reaction causing severe rash involving mucus membranes or skin necrosis: Yes Has patient had a PCN reaction that required hospitalization: Unknown Has patient had a PCN reaction occurring within the last 10 years: Unknown If all of the above answers are "NO", then may proceed with Cephalosporin use.  . Sulfa Antibiotics Hives  . Ultram [Tramadol] Other (See Comments)    Dizzy    Review of Systems: Review of Systems  Constitutional: Negative for fever.  HENT:       Reports post nasal drip and nasal congestion at night. Denies rhinorrhea  Eyes:       Reports itchy water eyes  Respiratory: Positive for shortness of breath and wheezing. Negative for cough.   Cardiovascular: Negative for chest pain and palpitations.  Gastrointestinal: Negative for abdominal pain and heartburn.  Genitourinary: Negative for dysuria.  Skin: Positive for itching. Negative for rash.  Neurological: Negative for headaches.  Endo/Heme/Allergies: Positive  for environmental allergies.    Physical Exam: BP 110/78 (BP Location: Right Arm, Patient Position: Sitting, Cuff Size: Normal)   Pulse (!) 106   Temp (!) 97.3 F (36.3 C) (Temporal)   Resp 17   Ht 6' 3.98" (1.93 m)   Wt 138 lb (62.6 kg)   SpO2 95%   BMI 16.80 kg/m    Physical Exam Exam conducted with a chaperone present.  Constitutional:      Appearance: Normal appearance.  HENT:     Head: Normocephalic and atraumatic.     Comments: Pharynx normal, eyes normal, ears  normal. Nose: bilateral lower turbinates slightly edematous and slightly erythematous with no drainage noted    Right Ear: Tympanic membrane, ear canal and external ear normal.     Left Ear: Tympanic membrane, ear canal and external ear normal.     Mouth/Throat:     Mouth: Mucous membranes are moist.  Eyes:     Conjunctiva/sclera: Conjunctivae normal.  Cardiovascular:     Rate and Rhythm: Regular rhythm.     Heart sounds: Normal heart sounds.  Pulmonary:     Effort: Pulmonary effort is normal.     Breath sounds: Normal breath sounds.     Comments: Lungs clear to auscultation Skin:    General: Skin is warm.  Neurological:     Mental Status: She is alert and oriented to person, place, and time.  Psychiatric:        Mood and Affect: Mood normal.        Behavior: Behavior normal.        Thought Content: Thought content normal.        Judgment: Judgment normal.     Diagnostics:  FVC 3.23 L, FEV1 2.84 L. Predicted FVC 3.54 L, FEV1 2.80 L. Spirometry indicates normal ventilatory function  Assessment and Plan: 1. Severe persistent asthma, uncomplicated   2. Non-seasonal allergic rhinitis due to other allergic trigger   3. Anaphylaxis due to food   4. Adverse effect of drug, subsequent encounter     No orders of the defined types were placed in this encounter.   Patient Instructions  Severe persistent asthma Continue Symbicort 160/4.5 mcg 2 puffs twice a day with spacer to help prevent cough and wheeze Continue Singulair 10 mg once a day to help prevent cough and wheeze. May use albuterol 2 puffs every 4-6 hours as needed for cough, wheeze, tightness in chest, shortness of breath.  Also may use albuterol 2 puffs 5 to 15 minutes prior to exercise. StartDupixent injections every 2 weeks. Sample given in office today. She will drop off her paperwork this Firiday Asthma control goals:   Full participation in all desired activities (may need albuterol before activity)  Albuterol  use two time or less a week on average (not counting use with activity)  Cough interfering with sleep two time or less a month  Oral steroids no more than once a year  No hospitalizations  Allergic rhinitis(cat dander, tree pollen, weed pollen, grass, and dog) Continue RyVent 1 tablet twice a day as needed  Anaphylaxis due to food Avoid wheat, rye, barley, oat(gluten products). In case of an allergic reaction, give Benadryl 4 teaspoonfuls every 4 hours, and if life-threatening symptoms occur, inject with EpiPen 0.3 mg.  Adverse drug effect Continue to avoid loratadine (Claritin)  Bring your medications with you to your next appoitment  Please let us know if this treatment plan is not working well for you Schedule a follow-up appointment  in 2 months or sooner   Return in about 2 months (around 07/17/2020), or if symptoms worsen or fail to improve.    Thank you for the opportunity to care for this patient.  Please do not hesitate to contact me with questions.  Althea Charon, FNP [facility]

## 2020-05-17 NOTE — Progress Notes (Signed)
Immunotherapy   Patient Details  Name: Claire [name] MRN: [id number] Date of Birth: [birth year]  05/17/2020  Claire [name] Claire started injections for  Dupixent. Given sample in office. Loading dose of 600 mg today. One injection in her right upper arm and one injection in left abdomen. Patient will receive her injection every 2 weeks. She did wait 30 minutes in the office post injection with no local or systemic reactions.  Epi-Pen:Epi-Pen Available  Consent signed and patient instructions given.   Rosalio Loud 05/17/2020, 2:24 PM

## 2020-05-17 NOTE — Addendum Note (Signed)
Addended by: Jaynie Crumble on: 05/17/2020 04:54 PM   Modules accepted: Orders

## 2020-05-22 ENCOUNTER — Other Ambulatory Visit: Payer: Self-pay | Admitting: Orthopedic Surgery

## 2020-05-22 ENCOUNTER — Telehealth: Payer: Self-pay | Admitting: *Deleted

## 2020-05-22 ENCOUNTER — Other Ambulatory Visit (HOSPITAL_COMMUNITY): Payer: Self-pay | Admitting: Orthopedic Surgery

## 2020-05-22 DIAGNOSIS — M67912 Unspecified disorder of synovium and tendon, left shoulder: Secondary | ICD-10-CM

## 2020-05-22 NOTE — Telephone Encounter (Signed)
-----   Message from Althea Charon, [place] sent at 05/17/2020  4:50 PM EST ----- Sample of Dupixent given today while in office. She is supposed to bring her Dupixent paper work by the office Friday.

## 2020-05-22 NOTE — Telephone Encounter (Signed)
Called patient and advised submit to [facility] [address] and they will be reaching out to her in regards to patient assistance

## 2020-06-01 ENCOUNTER — Other Ambulatory Visit: Payer: Self-pay

## 2020-06-01 ENCOUNTER — Ambulatory Visit (HOSPITAL_COMMUNITY)
Admission: RE | Admit: 2020-06-01 | Discharge: 2020-06-01 | Disposition: A | Discharge status: Home / Self Care | Payer: Medicare Other | Source: Ambulatory Visit | Attending: Orthopedic Surgery | Admitting: Orthopedic Surgery

## 2020-06-01 DIAGNOSIS — M67912 Unspecified disorder of synovium and tendon, left shoulder: Secondary | ICD-10-CM | POA: Diagnosis not present

## 2020-06-05 NOTE — Progress Notes (Deleted)
PATIENT: Claire [name] DOB: [birth year]  REASON FOR VISIT: follow up HISTORY FROM: patient  HISTORY OF PRESENT ILLNESS: Today 06/05/20 Claire [name] is a 50 year old female with history of fibromyalgia and seizure-like episodes. Is on Keppra.  HISTORY 08/24/2019 Dr. Jannifer Franklin: Claire [name] is a 50 year old left-handed [place] female with a history of fibromyalgia and seizure-like episodes in the past.  The patient was last seen through this office in July 2018 for this reason.  The patient was on Keppra and had a good response to the medication, she had not had any events while on the medication.  It appears that she stopped the medication when she had no insurance and could not afford the drug and then began having seizure type events again.  Last such event was 2 weeks ago.  The patient will have events where she becomes confused, she has amnesia for 30 to 60 minutes.  She does not have generalized jerking or tongue biting or bowel and bladder incontinence.  The patient is still operating a motor vehicle apparently.  She is followed through a pain center for her fibromyalgia.  She is having troubles with gait instability, she has chronic low back pain, she uses a cane to get around and she will occasionally fall.  She does not drink alcohol.  She comes to this office for an evaluation.   REVIEW OF SYSTEMS: Out of a complete 14 system review of symptoms, the patient complains only of the following symptoms, and all other reviewed systems are negative.  ALLERGIES: Allergies  Allergen Reactions  . Baclofen Shortness Of Breath    Swollen ankles  . Gluten Meal   . Claritin [Loratadine] Other (See Comments)    shaking  . Cymbalta [Duloxetine Hcl] Hives    "Hives, forgot who I was"  . Penicillins Hives    Has patient had a PCN reaction causing immediate rash, facial/tongue/throat swelling, SOB or lightheadedness with hypotension: Unknown Has patient had a PCN reaction causing severe rash involving mucus  membranes or skin necrosis: Yes Has patient had a PCN reaction that required hospitalization: Unknown Has patient had a PCN reaction occurring within the last 10 years: Unknown If all of the above answers are "NO", then may proceed with Cephalosporin use.  . Sulfa Antibiotics Hives  . Ultram [Tramadol] Other (See Comments)    Dizzy    HOME MEDICATIONS: Outpatient Medications Prior to Visit  Medication Sig Dispense Refill  . albuterol (PROVENTIL) (2.5 MG/3ML) 0.083% nebulizer solution Take 3 mLs (2.5 mg total) by nebulization every 4 (four) hours as needed for wheezing or shortness of breath. 75 mL 1  . albuterol (VENTOLIN HFA) 108 (90 Base) MCG/ACT inhaler Inhale two puffs every 4-6 hours if needed for cough or wheeze. 18 g 1  . Cholecalciferol (VITAMIN D) 50 MCG (2000 UT) tablet 1 tablet    . diphenhydrAMINE (BENADRYL) 25 MG tablet Take 50 mg by mouth as needed.     Marland Kitchen escitalopram (LEXAPRO) 20 MG tablet Take 1 tablet (20 mg total) by mouth daily. 90 tablet 0  . ibuprofen (ADVIL,MOTRIN) 600 MG tablet Take 1 tablet (600 mg total) by mouth every 6 (six) hours as needed. 30 tablet 0  . ipratropium-albuterol (DUONEB) 0.5-2.5 (3) MG/3ML SOLN     . levETIRAcetam (KEPPRA) 500 MG tablet Take 1 tablet (500 mg total) by mouth 2 (two) times daily. 180 tablet 3  . LINZESS 290 MCG CAPS capsule TAKE 1 CAPSULE BY MOUTH ONCE DAILY BEFORE BREAKFAST 90 capsule  3  . methocarbamol (ROBAXIN) 500 MG tablet Take 500 mg by mouth 3 (three) times daily as needed.    . metoprolol succinate (TOPROL-XL) 25 MG 24 hr tablet Take 1 tablet (25 mg total) by mouth daily. 90 tablet 3  . mirtazapine (REMERON) 45 MG tablet Take 1 tablet (45 mg total) by mouth at bedtime. 90 tablet 0  . montelukast (SINGULAIR) 10 MG tablet Take 1 tablet (10 mg total) by mouth at bedtime. 30 tablet 5  . Multiple Vitamin (MULTIVITAMIN WITH MINERALS) TABS tablet Take 2 tablets by mouth daily.     . NARCAN 4 MG/0.1ML LIQD nasal spray kit Place 1  spray into the nose as directed.    . omeprazole (PRILOSEC) 40 MG capsule TAKE 1 CAPSULE BY MOUTH ONCE DAILY BEFORE BREAKFAST 90 capsule 3  . oxyCODONE-acetaminophen (PERCOCET) 10-325 MG tablet Take 1 tablet by mouth every 6 (six) hours as needed.    . Potassium 99 MG TABS 1 tablet    . QUEtiapine (SEROQUEL) 50 MG tablet Take 1 tablet (50 mg total) by mouth at bedtime. 90 tablet 0  . SAVELLA 50 MG TABS tablet Take 50 mg by mouth 2 (two) times daily.    . SYMBICORT 160-4.5 MCG/ACT inhaler Inhale two puffs twice daily to prevent cough or wheeze. Rinse mouth after use. 10.2 g 3  . telmisartan (MICARDIS) 40 MG tablet Take 40 mg by mouth daily.    . tiZANidine (ZANAFLEX) 2 MG tablet Take 2 mg by mouth 3 (three) times daily.     Facility-Administered Medications Prior to Visit  Medication Dose Route Frequency Provider Last Rate Last Admin  . dupilumab (DUPIXENT) prefilled syringe 400 mg  400 mg Subcutaneous Once Padgett, Shaylar Patricia, MD        PAST MEDICAL HISTORY: Past Medical History:  Diagnosis Date  . Acid reflux   . Allergy    pollen, dust  . Anxiety   . Arthritis   . Asthma   . Carpal tunnel syndrome    bilateral  . DDD (degenerative disc disease), cervical   . Depression   . Fibromyalgia   . Hypertension   . Migraine   . Palpitations   . Panic attacks   . PTSD (post-traumatic stress disorder)   . Seizures (HCC)    unknown etiology; last seizure was 2 years ago; on meds.  . Syncope and collapse   . Tennis elbow   . Ulcer   . Vertigo     PAST SURGICAL HISTORY: Past Surgical History:  Procedure Laterality Date  . BACK SURGERY  1993  . BIOPSY  10/21/2016   Procedure: BIOPSY;  Surgeon: Rourk, Robert M, MD;  Location: [facility];  Service: Endoscopy;;  gastric, esophagus  . CHOLECYSTECTOMY    . ESOPHAGOGASTRODUODENOSCOPY (EGD) WITH PROPOFOL N/A 10/21/2016   Procedure: ESOPHAGOGASTRODUODENOSCOPY (EGD) WITH PROPOFOL;  Surgeon: Rourk, Robert M, MD;  Location: [facility];  Service: Endoscopy;  Laterality: N/A;  9:30am  . GALLBLADDER SURGERY    . SHOULDER SURGERY Right   . SPINE SURGERY  1993   lumbar disc surgery    FAMILY HISTORY: Family History  Problem Relation Age of Onset  . COPD Mother   . Bronchitis Mother   . Arthritis Mother   . Depression Mother   . Heart disease Mother   . Hypertension Mother   . Colon cancer Maternal Grandmother   . Alcohol abuse Father   . Early death Father          GSW  . PKU Son     SOCIAL HISTORY: Social History   Socioeconomic History  . Marital status: Single    Spouse name: Not on file  . Number of children: 1  . Years of education: HS  . Highest education level: Not on file  Occupational History  . Occupation: disability    Comment: unemployed  Tobacco Use  . Smoking status: Never Smoker  . Smokeless tobacco: Never Used  Vaping Use  . Vaping Use: Never used  Substance and Sexual Activity  . Alcohol use: Not Currently    Comment: drink occasionally  . Drug use: No  . Sexual activity: Not Currently    Partners: Female    Birth control/protection: None  Other Topics Concern  . Not on file  Social History Narrative   Patient is left handed.   Patient drinks very little caffeine.   Lives alone   Social Determinants of Health   Financial Resource Strain: Not on file  Food Insecurity: Not on file  Transportation Needs: Not on file  Physical Activity: Not on file  Stress: Not on file  Social Connections: Not on file  Intimate Partner Violence: Not on file      PHYSICAL EXAM  There were no vitals filed for this visit. There is no height or weight on file to calculate BMI.  Generalized: Well developed, in no acute distress   Neurological examination  Mentation: Alert oriented to time, place, history taking. Follows all commands speech and language fluent Cranial nerve II-XII: Pupils were equal round reactive to light. Extraocular movements were full, visual field were full on  confrontational test. Facial sensation and strength were normal. Uvula tongue midline. Head turning and shoulder shrug  were normal and symmetric. Motor: The motor testing reveals 5 over 5 strength of all 4 extremities. Good symmetric motor tone is noted throughout.  Sensory: Sensory testing is intact to soft touch on all 4 extremities. No evidence of extinction is noted.  Coordination: Cerebellar testing reveals good finger-nose-finger and heel-to-shin bilaterally.  Gait and station: Gait is normal. Tandem gait is normal. Romberg is negative. No drift is seen.  Reflexes: Deep tendon reflexes are symmetric and normal bilaterally.   DIAGNOSTIC DATA (LABS, IMAGING, TESTING) - I reviewed patient records, labs, notes, testing and imaging myself where available.  Lab Results  Component Value Date   WBC 9.7 05/13/2019   HGB 14.0 05/13/2019   HCT 38.9 05/13/2019   MCV 92 05/13/2019   PLT 427 05/13/2019      Component Value Date/Time   NA 138 12/10/2017 1819   K 3.6 12/10/2017 1819   CL 105 12/10/2017 1819   CO2 24 12/10/2017 1819   GLUCOSE 99 12/10/2017 1819   BUN 8 12/10/2017 1819   CREATININE 0.80 12/10/2017 1819   CALCIUM 9.1 12/10/2017 1819   PROT 7.6 04/04/2015 1953   ALBUMIN 4.2 04/04/2015 1953   AST 18 04/04/2015 1953   ALT 16 04/04/2015 1953   ALKPHOS 61 04/04/2015 1953   BILITOT 0.4 04/04/2015 1953   GFRNONAA >60 12/10/2017 1819   GFRAA >60 12/10/2017 1819   No results found for: CHOL, HDL, LDLCALC, LDLDIRECT, TRIG, CHOLHDL Lab Results  Component Value Date   HGBA1C 4.9 09/13/2016   No results found for: VITAMINB12 Lab Results  Component Value Date   TSH 0.553 10/25/2014      ASSESSMENT AND PLAN 49 y.o. year old female  has a past medical history of Acid reflux, Allergy, Anxiety, Arthritis,   Asthma, Carpal tunnel syndrome, DDD (degenerative disc disease), cervical, Depression, Fibromyalgia, Hypertension, Migraine, Palpitations, Panic attacks, PTSD (post-traumatic  stress disorder), Seizures (HCC), Syncope and collapse, Tennis elbow, Ulcer, and Vertigo. here with:  1.  Fibromyalgia 2.  Seizure-like spells  I spent 15 minutes with the patient. 50% of this time was spent    , AGNP-C, DNP 06/05/2020, 4:14 PM [facility] [address], [address] [place], [place] [address] [phone]   

## 2020-06-06 ENCOUNTER — Ambulatory Visit: Payer: Medicare Other | Admitting: Neurology

## 2020-07-18 NOTE — Patient Instructions (Addendum)
Severe persistent asthma Stop Symbicort 160/4.5 mcg  Start Trelegy 100 mcg 1 puff once a day.  2 samples given Continue Singulair 10 mg once a day to help prevent cough and wheeze. May use albuterol 2 puffs every 4-6 hours as needed for cough, wheeze, tightness in chest, shortness of breath.  Also may use albuterol 2 puffs 5 to 15 minutes prior to exercise. Start Dupixent injections every 2 weeks.  We will contact Tammy, our Biologics coordinator, to see where you are in the process of getting approved Asthma control goals:   Full participation in all desired activities (may need albuterol before activity)  Albuterol use two time or less a week on average (not counting use with activity)  Cough interfering with sleep two time or less a month  Oral steroids no more than once a year  No hospitalizations  Allergic rhinitis(cat dander, tree pollen, weed pollen, grass, and dog) Continue RyVent 1 tablet twice a day as needed.  Please call our office and let us know what medication you are taking  Allergic conjunctivitis Start Pataday 1 drop each eye once a day as needed for itchy watery eyes  Anaphylaxis due to food Avoid wheat, rye, barley, oat(gluten products). In case of an allergic reaction, give Benadryl 4 teaspoonfuls every 4 hours, and if life-threatening symptoms occur, inject with EpiPen 0.3 mg.  Adverse drug effect Continue to avoid loratadine (Claritin)  Please contact your primary care physician about your heart racing at times and headaches  Please let us know if this treatment plan is not working well for you Schedule a follow-up appointment in 2 months or sooner

## 2020-07-19 ENCOUNTER — Encounter: Payer: Self-pay | Admitting: Family

## 2020-07-19 ENCOUNTER — Ambulatory Visit (INDEPENDENT_AMBULATORY_CARE_PROVIDER_SITE_OTHER): Payer: Medicare Other | Admitting: Family

## 2020-07-19 ENCOUNTER — Other Ambulatory Visit: Payer: Self-pay

## 2020-07-19 VITALS — BP 112/80 | HR 93 | Temp 97.2°F | Resp 16 | Ht 64.0 in | Wt 153.6 lb

## 2020-07-19 DIAGNOSIS — J4550 Severe persistent asthma, uncomplicated: Secondary | ICD-10-CM | POA: Diagnosis not present

## 2020-07-19 DIAGNOSIS — J3089 Other allergic rhinitis: Secondary | ICD-10-CM | POA: Diagnosis not present

## 2020-07-19 DIAGNOSIS — T50905D Adverse effect of unspecified drugs, medicaments and biological substances, subsequent encounter: Secondary | ICD-10-CM

## 2020-07-19 DIAGNOSIS — T7800XA Anaphylactic reaction due to unspecified food, initial encounter: Secondary | ICD-10-CM

## 2020-07-19 MED ORDER — OLOPATADINE HCL 0.2 % OP SOLN: OPHTHALMIC | 3 refills | Status: DC

## 2020-07-19 MED ORDER — TRELEGY ELLIPTA 100-62.5-25 MCG/INH IN AEPB: INHALATION_SPRAY | RESPIRATORY_TRACT | 3 refills | Status: DC

## 2020-07-19 NOTE — Progress Notes (Signed)
Her Dupixent approval for free drug was held up by them reaching out to her to see if she qualifies for extra help and she had not returned their calls. I will try again to reach out to her to explain what steps she needs to take. Claire [name]

## 2020-07-19 NOTE — Progress Notes (Signed)
[place], [name] [id number] Dept: [phone]  FOLLOW UP NOTE  Patient ID: Claire [name], female    DOB: [birth year]  Age: 50 y.o. MRN: [id number] Date of Office Visit: 07/19/2020  Assessment  Chief Complaint: Asthma  HPI [name] a 50 year old female who presents today for follow-up of severe persistent asthma, nonseasonal allergic rhinitis, anaphylaxis due to food, and adverse effect of drug.  She was last seen on May 17, 2020 by Althea Charon, FNP.  Since her last office visit she had left rotator cuff surgery on June 27, 2020.  She also reports that for the past week and a half she has had emesis after each meal.  Yesterday was the first meal she was able to keep food down.  She wonders if a spaghetti meal she ate that possibly contained gluten could have caused all this.  Discussed that I did not feel like gluten would be the cause for emesis for a week and a half.  Severe persistent asthma is reported as not well controlled with Symbicort 160/4.5 mcg 2 puffs twice a day with spacer, Singulair 10 mg once a day, and albuterol as needed.  She reports that she thinks she remembers seeing a letter that she got approved for Dupixent, but has not heard anything else.  She is interested in starting this.  She reports wheezing, tightness in her chest, and shortness of breath.  She denies any coughing or nocturnal awakenings.  Since her last office visit she has not required any systemic steroids or made any trips to the emergency room or urgent care due to breathing problems.  She is using her albuterol inhaler several times a day.  Allergic rhinitis is reported as moderately controlled with Benadryl.  She is not certain if she is taking RyVent.  Instructed her to call our office when she gets home to look at her medications so we can change accordingly.  She reports a little bit of nasal congestion in the morning and postnasal drip at times.  She does not like to use  nose spray.  She also reports that her nose feels dry and itchy.  She denies rhinorrhea.  She continues to avoid wheat, rye, barley, oat (gluten products) without any accidental ingestion or use of her EpiPen.  She continues to avoid Claritin without any problems.   Drug Allergies:  Allergies  Allergen Reactions  . Baclofen Shortness Of Breath    Swollen ankles  . Gluten Meal   . Claritin [Loratadine] Other (See Comments)    shaking  . Cymbalta [Duloxetine Hcl] Hives    "Hives, forgot who I was"  . Penicillins Hives    Has patient had a PCN reaction causing immediate rash, facial/tongue/throat swelling, SOB or lightheadedness with hypotension: Unknown Has patient had a PCN reaction causing severe rash involving mucus membranes or skin necrosis: Yes Has patient had a PCN reaction that required hospitalization: Unknown Has patient had a PCN reaction occurring within the last 10 years: Unknown If all of the above answers are "NO", then may proceed with Cephalosporin use.  . Sulfa Antibiotics Hives  . Ultram [Tramadol] Other (See Comments)    Dizzy    Review of Systems: Review of Systems  Constitutional: Negative for chills and fever.  HENT:       Reports a little nasal congestion in the morning denies rhinorrhea.  She also reports postnasal drip at times.  Eyes:       Reports itchy  watery eyes  Respiratory: Positive for shortness of breath and wheezing. Negative for cough.   Cardiovascular: Positive for palpitations. Negative for chest pain.  Gastrointestinal: Negative for heartburn.  Genitourinary: Negative for dysuria.  Skin: Positive for itching. Negative for rash.       Reports sensation of ants crawling on her.  She attributes this to her fibromyalgia  Neurological: Positive for headaches.  Endo/Heme/Allergies: Positive for environmental allergies.    Physical Exam: BP 112/80 (BP Location: Right Arm, Patient Position: Sitting, Cuff Size: Normal)   Pulse 93   Temp (!)  97.2 F (36.2 C) (Temporal)   Resp 16   Ht 5\' 4"  (1.626 m)   Wt 153 lb 9.6 oz (69.7 kg)   SpO2 97%   BMI 26.37 kg/m    Physical Exam Constitutional:      Appearance: Normal appearance.  HENT:     Head: Normocephalic and atraumatic.     Comments: Pharynx normal, eyes normal, ears normal, nose normal    Right Ear: Tympanic membrane, ear canal and external ear normal.     Left Ear: Tympanic membrane, ear canal and external ear normal.     Nose: Nose normal.     Mouth/Throat:     Mouth: Mucous membranes are moist.     Pharynx: Oropharynx is clear.  Eyes:     Conjunctiva/sclera: Conjunctivae normal.  Cardiovascular:     Rate and Rhythm: Regular rhythm.     Heart sounds: Normal heart sounds.  Pulmonary:     Effort: Pulmonary effort is normal.     Breath sounds: Normal breath sounds.     Comments: Lungs clear to auscultation Musculoskeletal:     Cervical back: Neck supple.  Skin:    General: Skin is warm.  Neurological:     Mental Status: She is alert and oriented to person, place, and time.  Psychiatric:        Mood and Affect: Mood normal.        Behavior: Behavior normal.        Thought Content: Thought content normal.        Judgment: Judgment normal.     Diagnostics: FVC 2.66 L, FEV1 2.13 L.  Predicted FVC 3.40 L, FEV1 2.71 L.  Spirometry indicates normal respiratory function  Assessment and Plan: 1. Not well controlled severe persistent asthma   2. Non-seasonal allergic rhinitis due to other allergic trigger   3. Anaphylaxis due to food   4. Adverse effect of drug, subsequent encounter     Meds ordered this encounter  Medications  . Olopatadine HCl (PATADAY) 0.2 % SOLN    Sig: Place 1 drop each eye once a day as needed for itchy watery eyes    Dispense:  2.5 mL    Refill:  3  . Fluticasone-Umeclidin-Vilant (TRELEGY ELLIPTA) 100-62.5-25 MCG/INH AEPB    Sig: Inhale 1 puff once a day to help prevent cough and wheeze    Dispense:  28 each    Refill:  3     Patient Instructions  Severe persistent asthma Stop Symbicort 160/4.5 mcg  Start Trelegy 100 mcg 1 puff once a day.  2 samples given Continue Singulair 10 mg once a day to help prevent cough and wheeze. May use albuterol 2 puffs every 4-6 hours as needed for cough, wheeze, tightness in chest, shortness of breath.  Also may use albuterol 2 puffs 5 to 15 minutes prior to exercise. Start Dupixent injections every 2 weeks.  We will contact Tammy,  our Biologics coordinator, to see where you are in the process of getting approved Asthma control goals:   Full participation in all desired activities (may need albuterol before activity)  Albuterol use two time or less a week on average (not counting use with activity)  Cough interfering with sleep two time or less a month  Oral steroids no more than once a year  No hospitalizations  Allergic rhinitis(cat dander, tree pollen, weed pollen, grass, and dog) Continue RyVent 1 tablet twice a day as needed.  Please call our office and let us know what medication you are taking  Allergic conjunctivitis Start Pataday 1 drop each eye once a day as needed for itchy watery eyes  Anaphylaxis due to food Avoid wheat, rye, barley, oat(gluten products). In case of an allergic reaction, give Benadryl 4 teaspoonfuls every 4 hours, and if life-threatening symptoms occur, inject with EpiPen 0.3 mg.  Adverse drug effect Continue to avoid loratadine (Claritin)  Please contact your primary care physician about your heart racing at times and headaches  Please let us know if this treatment plan is not working well for you Schedule a follow-up appointment in 2 months or sooner   Return in about 2 months (around 09/18/2020), or if symptoms worsen or fail to improve.    Thank you for the opportunity to care for this patient.  Please do not hesitate to contact me with questions.  Althea Charon, FNP [facility] [facility]

## 2020-07-24 ENCOUNTER — Telehealth: Payer: Self-pay | Admitting: *Deleted

## 2020-07-24 NOTE — Telephone Encounter (Signed)
-----   Message from Althea Charon, [place] sent at 07/21/2020  8:19 AM EDT ----- Thank you! ----- Message ----- From: Carin Hock, CMA Sent: 07/19/2020   4:22 PM EDT To: Althea Charon, FNP    ----- Message ----- From: Althea Charon, FNP Sent: 07/19/2020   3:41 PM EDT To: Carin Hock, CMA  Would you mind to check and see where [facility] is in the process of getting Dupixent approved. She thinks that she got a letter of approval, but has not heard anything else.

## 2020-07-24 NOTE — Telephone Encounter (Signed)
Called patient and advised that [place] [facility] requires patient to try and sign up for extra help LIS. If she is denied they will approve free drug. If she is approved we will get her Rx thru pharmacy

## 2020-07-29 IMAGING — CR DG LUMBAR SPINE COMPLETE 4+V
5 series · 5 of 5 positions shown · non-contrast
Comparison: Radiograph 10/07/2013

CLINICAL DATA: Fall, low back and right leg pain

EXAM:
LUMBAR SPINE - COMPLETE 4+ VIEW

[t l-spine a.p.]
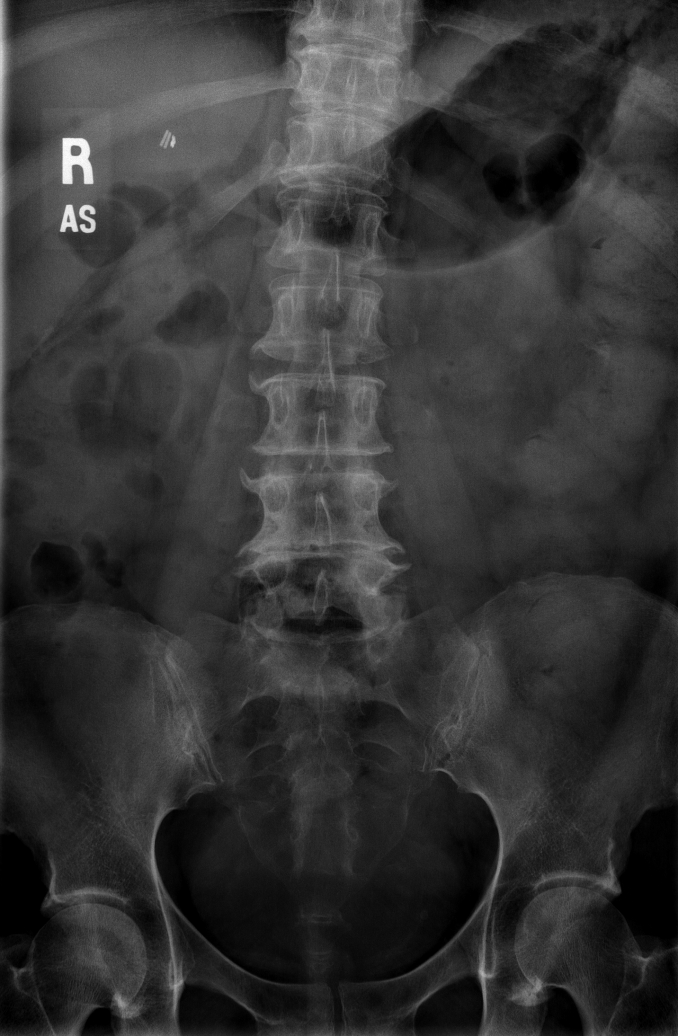

[t l-spine oblique exposure (1 of 2)]
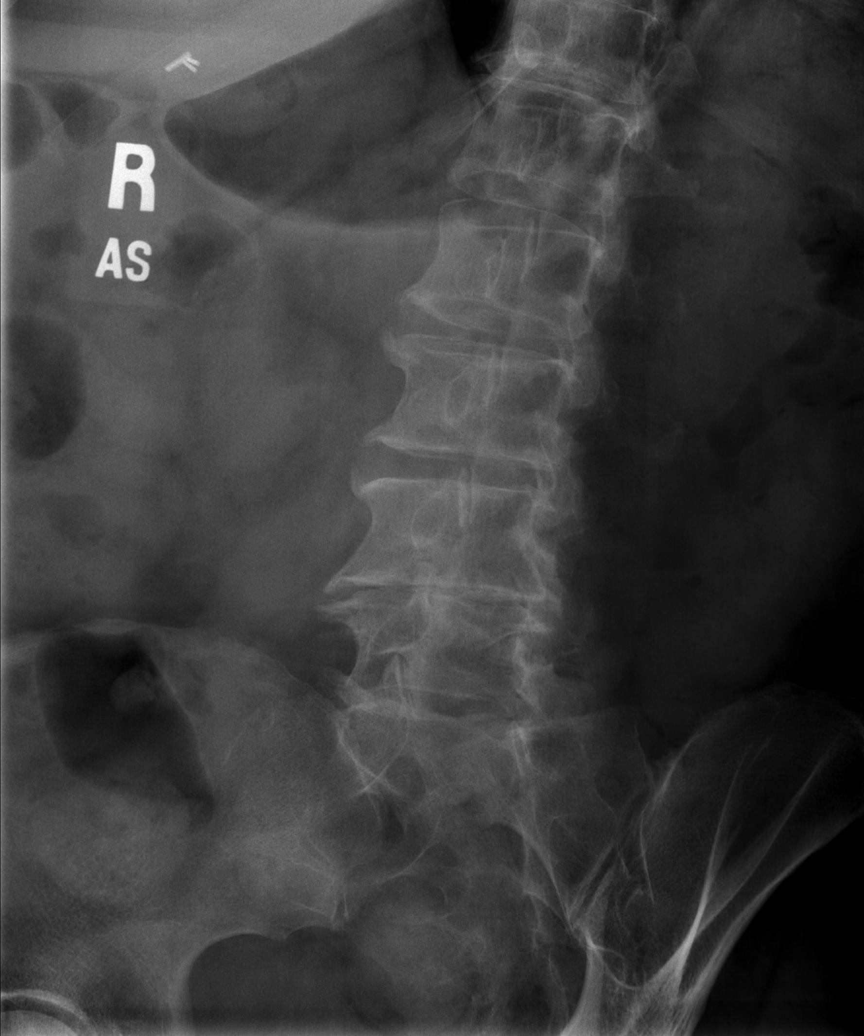

[t l-spine oblique exposure (2 of 2)]
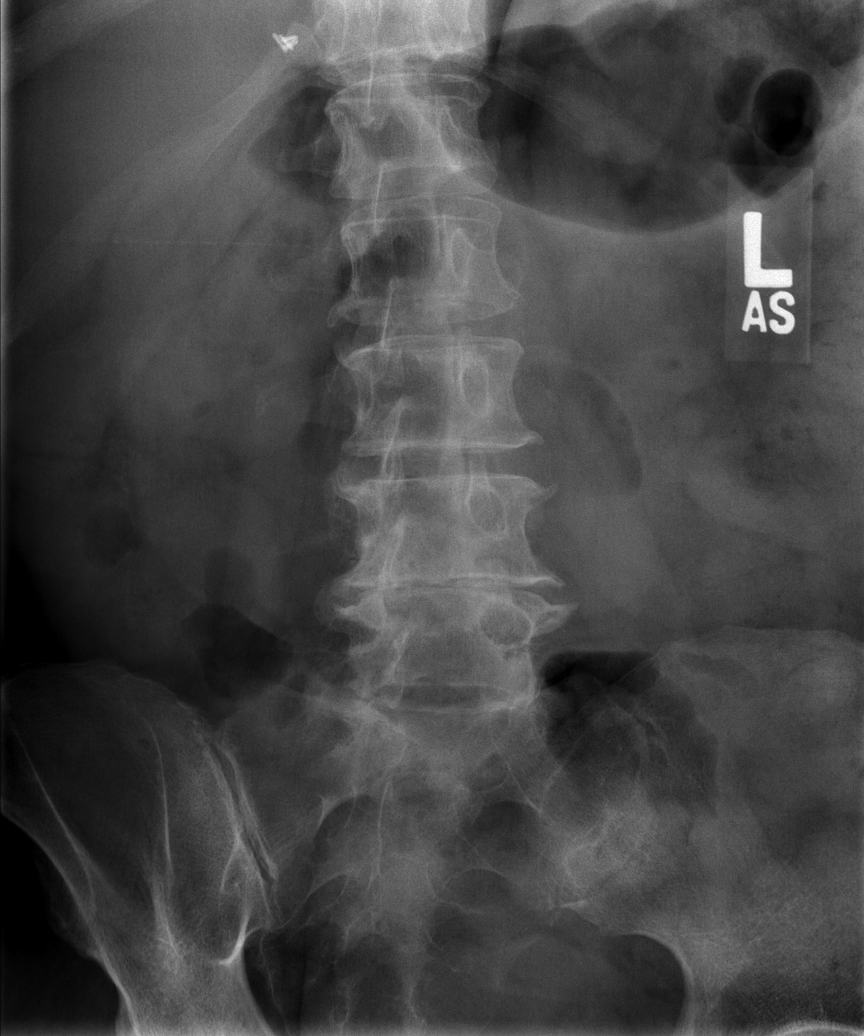

[t l-spine lat]
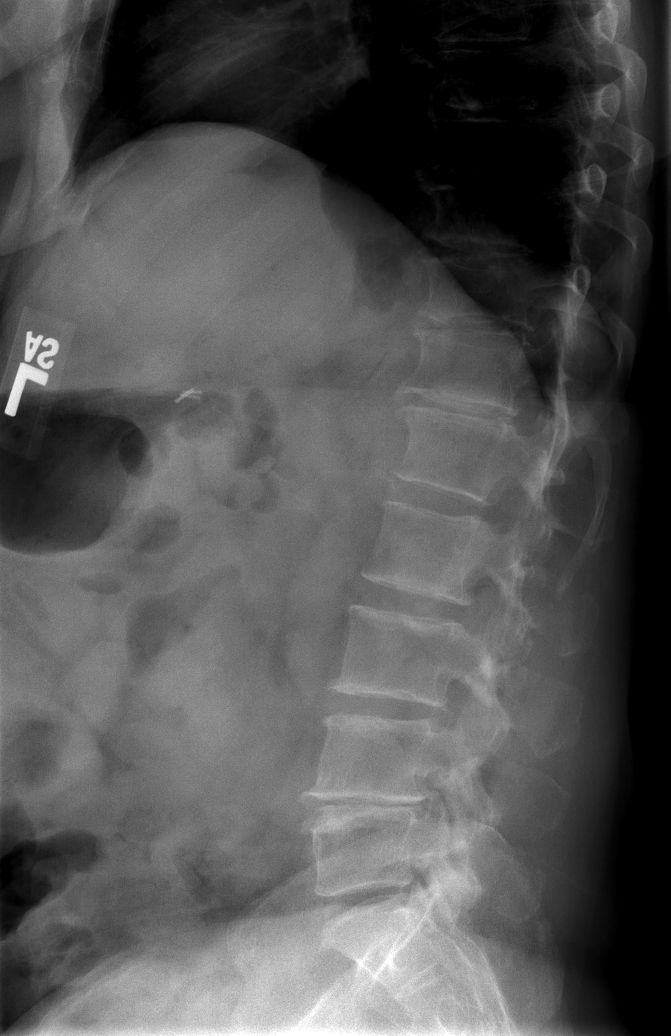

[t l-spine l5-s1 spot]
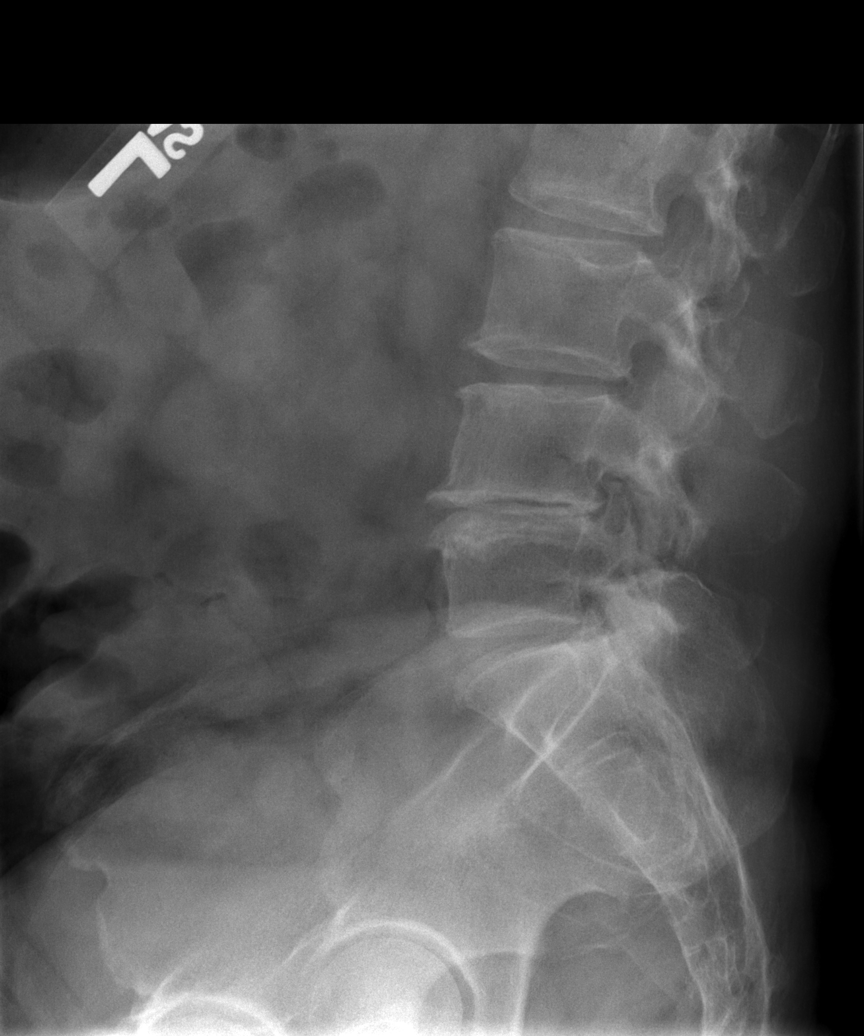

[5 of 5 positions shown; findings below may reference images not displayed]

FINDINGS: There are 5 lumbar type vertebral bodies. There are some sclerotic
changes and anterior wedging at the anterosuperior corner of the L5
vertebrae which is unchanged from prior. No definite acute fracture
or compression deformity is seen. There is multilevel discogenic
change throughout the included lower thoracic spine and within the
lumbar spine maximal at the L4-5 level. Discogenic and facet
degenerative changes are most pronounced L4-S1 with a congenitally
shortened appearance of the pedicles as well. No spondylolysis or
spondylolisthesis is evident. Cholecystectomy clips in the right
upper quadrant. Additional degenerative changes in the SI joints and
both hips.
IMPRESSION: 1. No acute fracture vertebral body height loss.
2. Stable anterior wedging along the anterior superior corner L5.
3. Multilevel discogenic and facet degenerative changes maximal at
L4-S1 levels.

## 2020-08-23 NOTE — Progress Notes (Signed)
PATIENT: Claire [name] DOB: Nov 23, 1970  REASON FOR VISIT: follow up HISTORY FROM: patient Primary Neurologist:  HISTORY OF PRESENT ILLNESS: Today 08/24/20 Claire [name] is a 50 year old female with history of fibromyalgia and seizure-like episodes.  Is on Keppra.  Seizure-like events are described as confusion, amnesia for 30 to 60 minutes.  No further spells in the last year.  Denies any side effects of Keppra.  Sees pain management, for fibro and chronic low back pain.  Uses a cane to ambulate.  Lives alone, drives a car, lives in [place].  Had left rotator cuff surgery in April, remains in PT.  Has had several GI illnesses this year, has lost 20 pounds.  Here today for evaluation unaccompanied.  HISTORY  08/24/2019 Dr. Jannifer Franklin: Claire [name] is a 50 year old left-handed [place] female with a history of fibromyalgia and seizure-like episodes in the past.  The patient was last seen through this office in July 2018 for this reason.  The patient was on Keppra and had a good response to the medication, she had not had any events while on the medication.  It appears that she stopped the medication when she had no insurance and could not afford the drug and then began having seizure type events again.  Last such event was 2 weeks ago.  The patient will have events where she becomes confused, she has amnesia for 30 to 60 minutes.  She does not have generalized jerking or tongue biting or bowel and bladder incontinence.  The patient is still operating a motor vehicle apparently.  She is followed through a pain center for her fibromyalgia.  She is having troubles with gait instability, she has chronic low back pain, she uses a cane to get around and she will occasionally fall.  She does not drink alcohol.  She comes to this office for an evaluation.  REVIEW OF SYSTEMS: Out of a complete 14 system review of symptoms, the patient complains only of the following symptoms, and all other reviewed systems are negative.  See  HPI  ALLERGIES: Allergies  Allergen Reactions   Baclofen Shortness Of Breath    Swollen ankles   Gluten Meal    Claritin [Loratadine] Other (See Comments)    shaking   Cymbalta [Duloxetine Hcl] Hives    "Hives, forgot who I was"   Penicillins Hives    Has patient had a PCN reaction causing immediate rash, facial/tongue/throat swelling, SOB or lightheadedness with hypotension: Unknown Has patient had a PCN reaction causing severe rash involving mucus membranes or skin necrosis: Yes Has patient had a PCN reaction that required hospitalization: Unknown Has patient had a PCN reaction occurring within the last 10 years: Unknown If all of the above answers are "NO", then may proceed with Cephalosporin use.   Sulfa Antibiotics Hives   Ultram [Tramadol] Other (See Comments)    Dizzy    HOME MEDICATIONS: Outpatient Medications Prior to Visit  Medication Sig Dispense Refill   albuterol (PROVENTIL) (2.5 MG/3ML) 0.083% nebulizer solution Take 3 mLs (2.5 mg total) by nebulization every 4 (four) hours as needed for wheezing or shortness of breath. 75 mL 1   albuterol (VENTOLIN HFA) 108 (90 Base) MCG/ACT inhaler Inhale two puffs every 4-6 hours if needed for cough or wheeze. 18 g 1   Cholecalciferol (VITAMIN D) 50 MCG (2000 UT) tablet 1 tablet     diphenhydrAMINE (BENADRYL) 25 MG tablet Take 50 mg by mouth as needed.      escitalopram (LEXAPRO) 20  MG tablet Take 1 tablet (20 mg total) by mouth daily. 90 tablet 0   Fluticasone-Umeclidin-Vilant (TRELEGY ELLIPTA) 100-62.5-25 MCG/INH AEPB Inhale 1 puff once a day to help prevent cough and wheeze 28 each 3   ibuprofen (ADVIL,MOTRIN) 600 MG tablet Take 1 tablet (600 mg total) by mouth every 6 (six) hours as needed. 30 tablet 0   ipratropium-albuterol (DUONEB) 0.5-2.5 (3) MG/3ML SOLN      levETIRAcetam (KEPPRA) 500 MG tablet Take 1 tablet (500 mg total) by mouth 2 (two) times daily. 180 tablet 3   LINZESS 290 MCG CAPS capsule TAKE 1 CAPSULE BY MOUTH  ONCE DAILY BEFORE BREAKFAST 90 capsule 3   methocarbamol (ROBAXIN) 500 MG tablet Take 500 mg by mouth 3 (three) times daily as needed.     metoprolol succinate (TOPROL-XL) 25 MG 24 hr tablet Take 1 tablet (25 mg total) by mouth daily. 90 tablet 3   mirtazapine (REMERON) 45 MG tablet Take 1 tablet (45 mg total) by mouth at bedtime. 90 tablet 0   montelukast (SINGULAIR) 10 MG tablet Take 1 tablet (10 mg total) by mouth at bedtime. 30 tablet 5   Multiple Vitamin (MULTIVITAMIN WITH MINERALS) TABS tablet Take 2 tablets by mouth daily.      NARCAN 4 MG/0.1ML LIQD nasal spray kit Place 1 spray into the nose as directed.     Olopatadine HCl (PATADAY) 0.2 % SOLN Place 1 drop each eye once a day as needed for itchy watery eyes 2.5 mL 3   omeprazole (PRILOSEC) 40 MG capsule TAKE 1 CAPSULE BY MOUTH ONCE DAILY BEFORE BREAKFAST 90 capsule 3   oxyCODONE-acetaminophen (PERCOCET) 10-325 MG tablet Take 1 tablet by mouth every 6 (six) hours as needed.     Potassium 99 MG TABS 1 tablet     QUEtiapine (SEROQUEL) 50 MG tablet Take 1 tablet (50 mg total) by mouth at bedtime. 90 tablet 0   SAVELLA 50 MG TABS tablet Take 50 mg by mouth 2 (two) times daily.     telmisartan (MICARDIS) 40 MG tablet Take 40 mg by mouth daily.     tiZANidine (ZANAFLEX) 2 MG tablet Take 2 mg by mouth 3 (three) times daily.     Facility-Administered Medications Prior to Visit  Medication Dose Route Frequency Provider Last Rate Last Admin   dupilumab (DUPIXENT) prefilled syringe 400 mg  400 mg Subcutaneous Once Kennith Gain, MD        PAST MEDICAL HISTORY: Past Medical History:  Diagnosis Date   Acid reflux    Allergy    pollen, dust   Anxiety    Arthritis    Asthma    Carpal tunnel syndrome    bilateral   DDD (degenerative disc disease), cervical    Depression    Fibromyalgia    Hypertension    Migraine    Palpitations    Panic attacks    PTSD (post-traumatic stress disorder)    Seizures ([place])    unknown  etiology; last seizure was 2 years ago; on meds.   Syncope and collapse    Tennis elbow    Ulcer    Vertigo     PAST SURGICAL HISTORY: Past Surgical History:  Procedure Laterality Date   BACK SURGERY  1993   BIOPSY  10/21/2016   Procedure: BIOPSY;  Surgeon: Daneil Dolin, MD;  Location: [facility];  Service: Endoscopy;;  gastric, esophagus   CHOLECYSTECTOMY     ESOPHAGOGASTRODUODENOSCOPY (EGD) WITH PROPOFOL N/A 10/21/2016   Procedure:  ESOPHAGOGASTRODUODENOSCOPY (EGD) WITH PROPOFOL;  Surgeon: Daneil Dolin, MD;  Location: [facility];  Service: Endoscopy;  Laterality: N/A;  9:30am   GALLBLADDER SURGERY     SHOULDER SURGERY Right    SPINE SURGERY  1993   lumbar disc surgery    FAMILY HISTORY: Family History  Problem Relation Age of Onset   COPD Mother    Bronchitis Mother    Arthritis Mother    Depression Mother    Heart disease Mother    Hypertension Mother    Colon cancer Maternal Grandmother    Alcohol abuse Father    Early death Father        GSW   PKU Son     SOCIAL HISTORY: Social History   Socioeconomic History   Marital status: Single    Spouse name: Not on file   Number of children: 1   Years of education: HS   Highest education level: Not on file  Occupational History   Occupation: disability    Comment: unemployed  Tobacco Use   Smoking status: Never   Smokeless tobacco: Never  Vaping Use   Vaping Use: Never used  Substance and Sexual Activity   Alcohol use: Not Currently    Comment: drink occasionally   Drug use: No   Sexual activity: Not Currently    Partners: Female    Birth control/protection: None  Other Topics Concern   Not on file  Social History Narrative   Patient is left handed.   Patient drinks very little caffeine.   Lives alone   Social Determinants of Health   Financial Resource Strain: Not on file  Food Insecurity: Not on file  Transportation Needs: Not on file  Physical Activity: Not on file  Stress: Not on file   Social Connections: Not on file  Intimate Partner Violence: Not on file   PHYSICAL EXAM  Vitals:   08/24/20 0747 08/24/20 0800  BP: (!) 86/61 90/65  Pulse: 98   Weight: 153 lb (69.4 kg)   Height: $Remove'5\' 4"'FdjYPOY$  (1.626 m)    Body mass index is 26.26 kg/m.  Generalized: Well developed, in no acute distress  Neurological examination  Mentation: Alert oriented to time, place, history taking. Follows all commands speech and language fluent Cranial nerve II-XII: Pupils were equal round reactive to light. Extraocular movements were full, visual field were full on confrontational test. Facial sensation and strength were normal. Head turning and shoulder shrug were normal and symmetric. Motor: The motor testing reveals 5 over 5 strength of all 4 extremities. Good symmetric motor tone is noted throughout.  Sensory: Sensory testing is intact to soft touch on all 4 extremities. No evidence of extinction is noted.  Coordination: Cerebellar testing reveals good finger-nose-finger and heel-to-shin bilaterally.  Gait and station: Gait is wide-based, antalgic, uses a single-point cane. Reflexes: Deep tendon reflexes are symmetric and normal bilaterally.   DIAGNOSTIC DATA (LABS, IMAGING, TESTING) - I reviewed patient records, labs, notes, testing and imaging myself where available.  Lab Results  Component Value Date   WBC 9.7 05/13/2019   HGB 14.0 05/13/2019   HCT 38.9 05/13/2019   MCV 92 05/13/2019   PLT 427 05/13/2019      Component Value Date/Time   NA 138 12/10/2017 1819   K 3.6 12/10/2017 1819   CL 105 12/10/2017 1819   CO2 24 12/10/2017 1819   GLUCOSE 99 12/10/2017 1819   BUN 8 12/10/2017 1819   CREATININE 0.80 12/10/2017 1819   CALCIUM  9.1 12/10/2017 1819   PROT 7.6 04/04/2015 1953   ALBUMIN 4.2 04/04/2015 1953   AST 18 04/04/2015 1953   ALT 16 04/04/2015 1953   ALKPHOS 61 04/04/2015 1953   BILITOT 0.4 04/04/2015 1953   GFRNONAA >60 12/10/2017 1819   GFRAA >60 12/10/2017 1819    No results found for: CHOL, HDL, LDLCALC, LDLDIRECT, TRIG, CHOLHDL Lab Results  Component Value Date   HGBA1C 4.9 09/13/2016   No results found for: VITAMINB12 Lab Results  Component Value Date   TSH 0.553 10/25/2014   ASSESSMENT AND PLAN 50 y.o. year old female  has a past medical history of Acid reflux, Allergy, Anxiety, Arthritis, Asthma, Carpal tunnel syndrome, DDD (degenerative disc disease), cervical, Depression, Fibromyalgia, Hypertension, Migraine, Palpitations, Panic attacks, PTSD (post-traumatic stress disorder), Seizures ([place]), Syncope and collapse, Tennis elbow, Ulcer, and Vertigo. here with:  1.  Fibromyalgia -Sees pain management  2.  Seizure-like spells -No further spells in the last year -Continue Keppra 500 mg twice a day -Call for spells, follow-up in 1 year or sooner if needed, needs to follow-up with PCP, BP on low end, weight loss   Butler Denmark, AGNP-C, DNP 08/24/2020, 8:00 AM [facility] [address] [place], [place] [address] [phone]

## 2020-08-24 ENCOUNTER — Encounter: Payer: Self-pay | Admitting: Neurology

## 2020-08-24 ENCOUNTER — Ambulatory Visit (INDEPENDENT_AMBULATORY_CARE_PROVIDER_SITE_OTHER): Payer: Medicare Other | Admitting: Neurology

## 2020-08-24 VITALS — BP 90/65 | HR 98 | Ht 64.0 in | Wt 153.0 lb

## 2020-08-24 DIAGNOSIS — R569 Unspecified convulsions: Secondary | ICD-10-CM | POA: Diagnosis not present

## 2020-08-24 MED ORDER — LEVETIRACETAM 500 MG PO TABS: 500.0000 mg | ORAL_TABLET | Freq: Two times a day (BID) | ORAL | 3 refills | Status: DC

## 2020-08-24 NOTE — Progress Notes (Signed)
I have read the note, and I agree with the clinical assessment and plan.  Phoenix Dresser K Zakayla Martinec   

## 2020-08-24 NOTE — Patient Instructions (Signed)
Continue Keppra at current dosing Call for any spells  Please see primary care, follow-up on BP See you back in 1 year

## 2020-11-04 ENCOUNTER — Other Ambulatory Visit: Payer: Self-pay | Admitting: Family

## 2020-11-05 ENCOUNTER — Other Ambulatory Visit: Payer: Self-pay | Admitting: Gastroenterology

## 2020-11-05 DIAGNOSIS — K219 Gastro-esophageal reflux disease without esophagitis: Secondary | ICD-10-CM

## 2020-11-05 NOTE — Telephone Encounter (Signed)
Ok ot refill Trelegy 1 puff once a day. Quantity 1 with 3 refills.

## 2020-11-06 ENCOUNTER — Encounter: Payer: Self-pay | Admitting: Internal Medicine

## 2020-11-06 NOTE — Telephone Encounter (Signed)
Will need nonurgent ov to continue refills. Limited refills provided today.

## 2020-12-01 ENCOUNTER — Ambulatory Visit (INDEPENDENT_AMBULATORY_CARE_PROVIDER_SITE_OTHER): Payer: Medicare Other | Admitting: Allergy & Immunology

## 2020-12-01 ENCOUNTER — Other Ambulatory Visit: Payer: Self-pay

## 2020-12-01 ENCOUNTER — Encounter: Payer: Self-pay | Admitting: Allergy & Immunology

## 2020-12-01 VITALS — BP 98/60 | HR 81 | Temp 98.4°F | Resp 16 | Ht 64.0 in | Wt 144.6 lb

## 2020-12-01 DIAGNOSIS — T7840XD Allergy, unspecified, subsequent encounter: Secondary | ICD-10-CM

## 2020-12-01 DIAGNOSIS — T7800XA Anaphylactic reaction due to unspecified food, initial encounter: Secondary | ICD-10-CM

## 2020-12-01 DIAGNOSIS — J3089 Other allergic rhinitis: Secondary | ICD-10-CM

## 2020-12-01 DIAGNOSIS — J4550 Severe persistent asthma, uncomplicated: Secondary | ICD-10-CM | POA: Diagnosis not present

## 2020-12-01 MED ORDER — MONTELUKAST SODIUM 10 MG PO TABS: 10.0000 mg | ORAL_TABLET | Freq: Every day | ORAL | 5 refills | Status: DC

## 2020-12-01 MED ORDER — ALBUTEROL SULFATE HFA 108 (90 BASE) MCG/ACT IN AERS: INHALATION_SPRAY | RESPIRATORY_TRACT | 1 refills | Status: DC

## 2020-12-01 MED ORDER — TRELEGY ELLIPTA 100-62.5-25 MCG/INH IN AEPB: INHALATION_SPRAY | RESPIRATORY_TRACT | 5 refills | Status: DC

## 2020-12-01 MED ORDER — ALBUTEROL SULFATE (2.5 MG/3ML) 0.083% IN NEBU: 2.5000 mg | INHALATION_SOLUTION | RESPIRATORY_TRACT | 1 refills | Status: DC | PRN

## 2020-12-01 NOTE — Progress Notes (Signed)
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  She never did start the [place] for 1 reason or another, but I do not think she needs it at this point.  Her change to Trelegy has helped manage her breathing issues. While she still has trouble doing stuff around the house, it has more to do with her chronic back pain rather than her breathing. We are going to get some more blood work to see if she has developed s alpha gal.  She also reacted to chicken on a few occasions, therefore we are going to make sure that her IgE is negative to that.  Plan/Recommendations:   Severe persistent asthma - Lung testing not done.  - We can hold off on Dupixent for now since you are stable.  - Continue with Trelegy 100 mcg 1 puff once a day.  - Continue Singulair 10 mg once a day to help prevent cough and wheeze. - May use albuterol 2 puffs every 4-6 hours as needed for cough, wheeze, tightness in chest, shortness of breath.  Also may use albuterol 2 puffs 5 to 15 minutes prior to exercise. Asthma control goals:  Full participation in all desired activities (may need albuterol before activity) Albuterol use two time or less a week on average (not counting use with activity) Cough interfering with sleep two time or less a month Oral steroids no more than once a year No hospitalizations  Allergic rhinitis (cat dander, tree pollen, weed pollen, grass, and dog) - Continue RyVent 1 tablet twice a day as needed.   - Continue Pataday one drop per eye daily.  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 In case of an allergic reaction, give Benadryl 4 teaspoonfuls every 4 hours, and if life-threatening symptoms occur, inject with EpiPen 0.3 mg. - We are going to get an alpha gal panel and a chicken IgE to be on the safe side. - We will call you in 1-2 weeks with the results of the testing.   Return in about 2 months (around 01/31/2021).     Subjective:   Claire [name] is a 50 y.o. female presenting today for follow up of  Chief Complaint  Patient presents with   Allergy Testing    Wants to see if she is she allergic to meats. States that fried chicken makes her sick. Pork make her sick. She says she vomits after consuming meats. Says she uses her air fryer. Also states that corn makes her sick also.     Claire [name] has a history of the following: Patient Active Problem List   Diagnosis Date Noted   PTSD (post-traumatic stress disorder) [id number]   Eosinophilic esophagitis [id number]   H. pylori infection 12/02/2016   Dysphagia 07/03/2016   Constipation 07/03/2016   Abdominal pain 07/03/2016   Generalized anxiety disorder 06/17/2016   Panic disorder 06/17/2016   Asthma in adult, mild intermittent, uncomplicated [id number]   GERD without esophagitis 06/11/2016   TMJ arthropathy 06/11/2016   Frequent falls 06/11/2016   Vertigo 06/11/2016   History of syncope 06/11/2016   Fibromyalgia  07/28/2014   Seizure-like activity ([place]) 07/29/2012    History obtained from: chart review and patient.  Claire [name] is a 50 y.o. female presenting for a follow up visit.  She was last seen in May 2022.  At that time, she was changed from Symbicort to Trelegy 1 puff once daily.  She was also continued on Singulair as well as albuterol.  She was started on Dupixent every 2 weeks.  For her allergic rhinitis, she was continued on RyVent.  She was also started on Pataday.  She will continue to avoid wheat, oat, and barley.   Asthma/Respiratory Symptom History: She is doing the Trelegy one  [puff once daily. She noticesd a big difference when she does this in the morning. Asthma is well controlled.  She filled out everything that she needed   Allergic Rhinitis Symptom History: She remains on the carbinoxamine as needed.  She also has Pataday to use as needed.  Food Allergy Symptom History: She reportrs that she became sick with chicken. She also ate some BBQ pork and hamburger. She will get nauseous and throw up. This is also happens with gluten.  She has not had chicken since then. It even happened when she was at home with an air fryer. He continues to avoid wheat, rye, barley, and oat.   Otherwise, there have been no changes to her past medical history, surgical history, family history, or social history.    Review of Systems  Constitutional: Negative.  Negative for chills, fever, malaise/fatigue and weight loss.  HENT:  Positive for congestion. Negative for ear discharge, ear pain and sinus pain.   Eyes:  Negative for pain, discharge and redness.  Respiratory:  Negative for cough, sputum production, shortness of breath and wheezing.   Cardiovascular: Negative.  Negative for chest pain and palpitations.  Gastrointestinal:  Negative for abdominal pain, constipation, diarrhea, heartburn, nausea and vomiting.  Skin: Negative.  Negative for itching and rash.  Neurological:  Negative for dizziness and headaches.  Endo/Heme/Allergies:  Positive for environmental allergies. Does not bruise/bleed easily.      Objective:   Blood pressure 98/60, pulse 81, temperature 98.4 F (36.9 C), temperature source Temporal, resp. rate 16, height '5\' 4"'$  (1.626 m), weight 144 lb 9.6 oz (65.6 kg), SpO2 98 %. Body mass index is 24.82 kg/m.   Physical Exam:  Physical Exam Constitutional:      Appearance: She is well-developed.  HENT:     Head: Normocephalic and atraumatic.     Right Ear: Tympanic membrane, ear canal and external ear normal.     Left Ear: Tympanic membrane, ear canal and  external ear normal.     Nose: No nasal deformity, septal deviation, mucosal edema or rhinorrhea.     Right Turbinates: Enlarged and swollen.     Left Turbinates: Enlarged and swollen.     Right Sinus: No maxillary sinus tenderness or frontal sinus tenderness.     Left Sinus: No maxillary sinus tenderness or frontal sinus tenderness.     Mouth/Throat:     Mouth: Mucous membranes are not pale and not dry.     Pharynx: Uvula midline.  Eyes:     General: Lids are normal. No allergic shiner.       Right eye: No discharge.        Left eye: No discharge.     Conjunctiva/sclera: Conjunctivae normal.     Right eye: Right conjunctiva is not injected. No chemosis.    Left eye: Left conjunctiva is  not injected. No chemosis.    Pupils: Pupils are equal, round, and reactive to light.  Cardiovascular:     Rate and Rhythm: Normal rate and regular rhythm.     Heart sounds: Normal heart sounds.  Pulmonary:     Effort: Pulmonary effort is normal. No tachypnea, accessory muscle usage or respiratory distress.     Breath sounds: Normal breath sounds. No wheezing, rhonchi or rales.     Comments: Moving air well in all lung fields. Chest:     Chest wall: No tenderness.  Lymphadenopathy:     Cervical: No cervical adenopathy.  Skin:    General: Skin is warm.     Capillary Refill: Capillary refill takes less than 2 seconds.     Coloration: Skin is not pale.     Findings: No abrasion, erythema, petechiae or rash. Rash is not papular, urticarial or vesicular.  Neurological:     Mental Status: She is alert.  Psychiatric:        Behavior: Behavior is cooperative.     Diagnostic studies: labs sent instead   Salvatore Marvel, MD  [facility]

## 2020-12-01 NOTE — Patient Instructions (Addendum)
Severe persistent asthma - Lung testing not done.  - We can hold off on Dupixent for now since you are stable.  - Continue with Trelegy 100 mcg 1 puff once a day.  - Continue Singulair 10 mg once a day to help prevent cough and wheeze. - May use albuterol 2 puffs every 4-6 hours as needed for cough, wheeze, tightness in chest, shortness of breath.  Also may use albuterol 2 puffs 5 to 15 minutes prior to exercise. Asthma control goals:  Full participation in all desired activities (may need albuterol before activity) Albuterol use two time or less a week on average (not counting use with activity) Cough interfering with sleep two time or less a month Oral steroids no more than once a year No hospitalizations  Allergic rhinitis(cat dander, tree pollen, weed pollen, grass, and dog) - Continue RyVent 1 tablet twice a day as needed.   - Continue Pataday one drop per eye daily.  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 In case of an allergic reaction, give Benadryl 4 teaspoonfuls every 4 hours, and if life-threatening symptoms occur, inject with EpiPen 0.3 mg. - We are going to get an alpha gal panel and a chicken IgE to be on the safe side. - We will call you in 1-2 weeks with the results of the testing.   Return in about 2 months (around 01/31/2021).    Please inform us of any Emergency Department visits, hospitalizations, or changes in symptoms. Call us before going to the ED for breathing or allergy symptoms since we might be able to fit you in for a sick visit. Feel free to contact us anytime with any questions, problems, or concerns.  It was a pleasure to see you again today!  Websites that have reliable patient information: 1. American Academy of Asthma, Allergy, and Immunology: www.aaaai.org 2. Food Allergy Research and Education (FARE): foodallergy.org 3. Mothers of Asthmatics:  http://www.asthmacommunitynetwork.org 4. American College of Allergy, Asthma, and Immunology: www.acaai.org   COVID-19 Vaccine Information can be found at: ShippingScam.[email or web address].uk For questions related to vaccine distribution or appointments, please email [email or web address] or call [phone].   We realize that you might be concerned about having an allergic reaction to the COVID19 vaccines. To help with that concern, WE ARE OFFERING THE COVID19 VACCINES IN OUR OFFICE! Ask the front desk for dates!     "Like" [place] on Facebook and Instagram for our latest updates!      A healthy democracy works best when [employer] participate! Make sure you are registered to vote! If you have moved or changed any of your contact information, you will need to get this updated before voting!  In some cases, you MAY be able to register to vote online: [email or web address]

## 2020-12-07 LAB — ALLERGEN STINGING INSECT PANEL
Honeybee IgE: 0.61 kU/L — AB
Hornet, White Face, IgE: 6.32 kU/L — AB
Hornet, Yellow, IgE: 4.64 kU/L — AB
Paper Wasp IgE: 10.7 kU/L — AB
Yellow Jacket, IgE: 4.14 kU/L — AB

## 2020-12-07 LAB — ALPHA-GAL PANEL
Allergen Lamb IgE: <0.1 kU/L
Beef IgE: <0.1 kU/L
IgE (Immunoglobulin E), Serum: 222 IU/mL (ref 6–495)
O215-IgE Alpha-Gal: <0.1 kU/L
Pork IgE: <0.1 kU/L

## 2020-12-07 LAB — TRYPTASE: Tryptase: 5.7 ug/L (ref 2.2–13.2)

## 2020-12-07 LAB — ALLERGEN, CHICKEN F83: Chicken IgE: <0.1 kU/L

## 2021-02-02 ENCOUNTER — Encounter: Payer: Self-pay | Admitting: Allergy & Immunology

## 2021-02-02 ENCOUNTER — Other Ambulatory Visit: Payer: Self-pay

## 2021-02-02 ENCOUNTER — Ambulatory Visit (INDEPENDENT_AMBULATORY_CARE_PROVIDER_SITE_OTHER): Payer: Medicare Other | Admitting: Allergy & Immunology

## 2021-02-02 VITALS — BP 122/94 | HR 109 | Temp 98.3°F | Resp 18 | Ht 64.0 in | Wt 146.4 lb

## 2021-02-02 DIAGNOSIS — T63481D Toxic effect of venom of other arthropod, accidental (unintentional), subsequent encounter: Secondary | ICD-10-CM

## 2021-02-02 DIAGNOSIS — T63441A Toxic effect of venom of bees, accidental (unintentional), initial encounter: Secondary | ICD-10-CM | POA: Diagnosis not present

## 2021-02-02 DIAGNOSIS — J4550 Severe persistent asthma, uncomplicated: Secondary | ICD-10-CM

## 2021-02-02 DIAGNOSIS — T7800XA Anaphylactic reaction due to unspecified food, initial encounter: Secondary | ICD-10-CM

## 2021-02-02 MED ORDER — LEVOCETIRIZINE DIHYDROCHLORIDE 5 MG PO TABS: 5.0000 mg | ORAL_TABLET | Freq: Every evening | ORAL | 5 refills | Status: DC

## 2021-02-02 NOTE — Addendum Note (Signed)
Addended by: Reggy Eye on: 02/02/2021 03:52 PM   Modules accepted: Orders

## 2021-02-02 NOTE — Progress Notes (Signed)
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 - We are not going to add the Dupixent back into the picture.  - Continue with Trelegy 100 mcg 1 puff once a day.  - Continue Singulair 10 mg once a day to help prevent cough and wheeze. - May use albuterol 2 puffs every 4-6 hours as needed for cough, wheeze, tightness in chest, shortness of breath.  Also may use albuterol 2 puffs 5 to 15 minutes prior to exercise. Asthma control goals:  Full participation in all desired activities (may need albuterol before activity) Albuterol use two time or less a week on average (not counting use with activity) Cough interfering with sleep two time or less a month Oral steroids no more than once a year No hospitalizations  Allergic rhinitis (cat dander, tree pollen, weed pollen, grass, and dog) - Stop the RyVent and start Xyzal 5mg  daily to help with the nasal itching. - Continue Pataday one drop per eye daily.  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 - Continue to avoid all of these items above.  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 - EpiPen refilled today.  Return in about 6 months (around 08/02/2021).   Subjective:   Claire [name] is a 50 y.o. female presenting today for follow up of  Chief Complaint  Patient presents with   Follow-up    Patient in today for a follow up and she states she has had a rough few days. Using her neb more frequently and it is working for her symptoms.    Claire [name] has a history of the following: Patient  Active Problem List   Diagnosis Date Noted   PTSD (post-traumatic stress disorder) 57/03/7791   Eosinophilic esophagitis 90/30/0923   H. pylori infection 12/02/2016   Dysphagia 07/03/2016   Constipation 07/03/2016   Abdominal pain 07/03/2016   Generalized anxiety disorder 06/17/2016   Panic disorder 06/17/2016   Asthma in adult, mild intermittent, uncomplicated 30/09/6224   GERD without esophagitis 06/11/2016   TMJ arthropathy 06/11/2016   Frequent falls 06/11/2016   Vertigo 06/11/2016   History of syncope 06/11/2016   Fibromyalgia 07/28/2014   Seizure-like activity ([place]) 07/29/2012    History obtained from: chart review and patient.  Claire [name] is a 50 y.o. female presenting for a follow up visit.  He was last seen in September 2022.  At that time, we held off on my testing.  She was doing fairly well on Trelegy 100 mcg 1 puff once daily, so we did not do any Dupixent.  For her allergic rhinitis, would continue with RyVent twice daily as well as for her food allergies, we recommended continued avoidance of wheat, rye, barley, oat.  He has seen insect panel which was positive to the entire panel.  Alpha gal was negative and chicken IgE was negative.  A serum tryptase was normal as well.  Asthma/Respiratory Symptom History: Breathing is under fair control. She has good days and bad days, more good than bad. She remains on the Trelegy one puff once daily. She has needed  her rescue inhaler over the past couple of weeks with her fall allergies. She does not refill her albuterol more than 1-2 times per year at the most. She has not been to the hospital and she has not needed prednisone.  She denies nighttime coughing.   Allergic Rhinitis Symptom History: She is having an itchy dry nose. She does not use any nose sprays on a routine basis. She does not like nose sprays.  She uses on Benadryl at least twice daily. She does not tolerate Claritin. Allegra did not help. She is unsure about Zyrtec or  Xyzal.   Food Allergy Symptom History: She avoids wheat, oat, barley, and rye. She has kept red meat in her diet to a certain extent. She does eat chicken. If she really wants a hamburger or a roast, she does not really fool with those.   Stinging Insect Allergy: She had testing that was positive to the entire panel. She was stung in the summertime over this year. She felt the sting and then hit it without identifying what the insect was. She was stung by two by two and she had swelling and itching that was present for two months. She does need a new EpiPen. She does not spend a lot of time outdoors. Her arthritis keeps her mostly inside of the home.   Otherwise, there have been no changes to her past medical history, surgical history, family history, or social history.    Review of Systems  Constitutional: Negative.  Negative for chills, fever, malaise/fatigue and weight loss.  HENT: Negative.  Negative for congestion, ear discharge, ear pain and sinus pain.   Eyes:  Negative for pain, discharge and redness.  Respiratory:  Negative for cough, sputum production, shortness of breath, wheezing and stridor.   Cardiovascular: Negative.  Negative for chest pain and palpitations.  Gastrointestinal:  Negative for abdominal pain, diarrhea, heartburn, nausea and vomiting.  Skin: Negative.  Negative for itching and rash.  Neurological:  Negative for dizziness and headaches.  Endo/Heme/Allergies:  Negative for environmental allergies. Does not bruise/bleed easily.      Objective:   Blood pressure (!) 122/94, pulse (!) 109, temperature 98.3 F (36.8 C), temperature source Temporal, resp. rate 18, height 5\' 4"  (1.626 m), weight 146 lb 6.4 oz (66.4 kg), SpO2 94 %. Body mass index is 25.13 kg/m.   Physical Exam:  Physical Exam Vitals reviewed.  Constitutional:      Appearance: She is well-developed.  HENT:     Head: Normocephalic and atraumatic.     Right Ear: Tympanic membrane, ear canal and  external ear normal.     Left Ear: Tympanic membrane, ear canal and external ear normal.     Nose: No nasal deformity, septal deviation, mucosal edema or rhinorrhea.     Right Turbinates: Enlarged, swollen and pale.     Left Turbinates: Enlarged, swollen and pale.     Right Sinus: No maxillary sinus tenderness or frontal sinus tenderness.     Left Sinus: No maxillary sinus tenderness or frontal sinus tenderness.     Mouth/Throat:     Mouth: Mucous membranes are not pale and not dry.     Pharynx: Uvula midline.  Eyes:     General: Lids are normal. No allergic shiner.       Right eye: No discharge.        Left eye: No discharge.     Conjunctiva/sclera: Conjunctivae normal.     Right eye: Right conjunctiva is not injected.  No chemosis.    Left eye: Left conjunctiva is not injected. No chemosis.    Pupils: Pupils are equal, round, and reactive to light.  Cardiovascular:     Rate and Rhythm: Normal rate and regular rhythm.     Heart sounds: Normal heart sounds.  Pulmonary:     Effort: Pulmonary effort is normal. No tachypnea, accessory muscle usage or respiratory distress.     Breath sounds: Normal breath sounds. No wheezing, rhonchi or rales.     Comments: Moving air well in all lung fields. No increased work of breathing noted.  Chest:     Chest wall: No tenderness.  Lymphadenopathy:     Cervical: No cervical adenopathy.  Skin:    Coloration: Skin is not pale.     Findings: No abrasion, erythema, petechiae or rash. Rash is not papular, urticarial or vesicular.  Neurological:     Mental Status: She is alert.  Psychiatric:        Behavior: Behavior is cooperative.     Diagnostic studies:    Spirometry: results normal (FEV1: 2.63/97%, FVC: 3.16/93%, FEV1/FVC: 83%).    Spirometry consistent with normal pattern.   Allergy Studies: none        Salvatore Marvel, MD  [facility]

## 2021-02-02 NOTE — Patient Instructions (Addendum)
Severe persistent asthma - Lung testing looked good today. - We are not going to add the Dupixent back into the picture.  - Continue with Trelegy 100 mcg 1 puff once a day.  - Continue Singulair 10 mg once a day to help prevent cough and wheeze. - May use albuterol 2 puffs every 4-6 hours as needed for cough, wheeze, tightness in chest, shortness of breath.  Also may use albuterol 2 puffs 5 to 15 minutes prior to exercise. Asthma control goals:  Full participation in all desired activities (may need albuterol before activity) Albuterol use two time or less a week on average (not counting use with activity) Cough interfering with sleep two time or less a month Oral steroids no more than once a year No hospitalizations  Allergic rhinitis(cat dander, tree pollen, weed pollen, grass, and dog) - Stop the RyVent and start Xyzal 5mg  daily to help with the nasal itching. - Continue Pataday one drop per eye daily.  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 - Continue to avoid all of these items above.  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 - EpiPen refilled today.  Return in about 6 months (around 08/02/2021).    Please inform us of any Emergency Department visits, hospitalizations, or changes in symptoms. Call us before going to the ED for breathing or allergy symptoms since we might be able to fit you in for a sick visit. Feel free to contact us anytime with any questions, problems, or concerns.  It was a pleasure to see you again today!  Websites that have reliable patient information: 1. American Academy of Asthma, Allergy, and Immunology: www.aaaai.org 2. Food Allergy Research and Education (FARE): foodallergy.org 3. Mothers of Asthmatics: http://www.asthmacommunitynetwork.org 4. American College of Allergy, Asthma, and Immunology: www.acaai.org   COVID-19 Vaccine Information can be found at:  [email or web address].[email or web address].uk For questions related to vaccine distribution or appointments, please email [email or web address] or call [phone].   We realize that you might be concerned about having an allergic reaction to the COVID19 vaccines. To help with that concern, WE ARE OFFERING THE COVID19 VACCINES IN OUR OFFICE! Ask the front desk for dates!     "Like" [place] on Facebook and Instagram for our latest updates!      A healthy democracy works best when [employer] participate! Make sure you are registered to vote! If you have moved or changed any of your contact information, you will need to get this updated before voting!  In some cases, you MAY be able to register to vote online: [email or web address]

## 2021-02-05 ENCOUNTER — Telehealth: Payer: Self-pay

## 2021-02-05 MED ORDER — EPINEPHRINE 0.3 MG/0.3ML IJ SOAJ: 0.3000 mg | Freq: Once | INTRAMUSCULAR | 1 refills | Status: AC

## 2021-02-05 NOTE — Telephone Encounter (Signed)
Sent in epipen refill to [facility] in [place]

## 2021-02-05 NOTE — Telephone Encounter (Signed)
Patient called stating she did not get her epi pen refill sent into the [facility].  Please Advise

## 2021-04-06 ENCOUNTER — Telehealth: Payer: Self-pay

## 2021-04-06 MED ORDER — TRIAMCINOLONE ACETONIDE 0.1 % EX OINT: 1.0000 "application " | TOPICAL_OINTMENT | Freq: Two times a day (BID) | CUTANEOUS | 5 refills | Status: DC

## 2021-04-06 NOTE — Addendum Note (Signed)
Addended by: Herbie Drape on: 04/06/2021 04:50 PM   Modules accepted: Orders

## 2021-04-06 NOTE — Telephone Encounter (Signed)
Patient called back and is going to send pics via mychart.

## 2021-04-06 NOTE — Telephone Encounter (Signed)
Dr. Ernst Bowler informed me to send in Triamcinolone 0.1% for patient to use twice a day. Patient verbalized understanding.

## 2021-04-06 NOTE — Telephone Encounter (Signed)
Patient called stating she started back breaking out in a rash today. It is on her chest, neck and inside of her left arm. It is fine little clusters of bumps. Patient states she hasn't changed anything or introduced anything that she can think of that would flare her up.   Claire [name]

## 2021-04-06 NOTE — Telephone Encounter (Signed)
Called the patient, left a message asking if patient could take a picture of her rash and upload it to her mychart. Dr. Ernst Bowler please advise.

## 2021-04-10 NOTE — Telephone Encounter (Signed)
Thank you!   Jaislyn Blinn, MD [facility]  

## 2021-06-03 ENCOUNTER — Other Ambulatory Visit: Payer: Self-pay | Admitting: Gastroenterology

## 2021-06-04 ENCOUNTER — Encounter: Payer: Self-pay | Admitting: Internal Medicine

## 2021-06-04 NOTE — Telephone Encounter (Signed)
Last office visit 06/07/19

## 2021-06-04 NOTE — Telephone Encounter (Signed)
I have refilled but needs office visit.  ?

## 2021-06-13 ENCOUNTER — Ambulatory Visit (INDEPENDENT_AMBULATORY_CARE_PROVIDER_SITE_OTHER): Payer: Medicare Other | Admitting: Allergy & Immunology

## 2021-06-13 ENCOUNTER — Encounter: Payer: Self-pay | Admitting: Allergy & Immunology

## 2021-06-13 VITALS — BP 112/82 | HR 83 | Resp 17 | Ht 62.0 in | Wt 139.4 lb

## 2021-06-13 DIAGNOSIS — J3089 Other allergic rhinitis: Secondary | ICD-10-CM

## 2021-06-13 DIAGNOSIS — J4550 Severe persistent asthma, uncomplicated: Secondary | ICD-10-CM

## 2021-06-13 DIAGNOSIS — T7800XA Anaphylactic reaction due to unspecified food, initial encounter: Secondary | ICD-10-CM | POA: Diagnosis not present

## 2021-06-13 MED ORDER — ALBUTEROL SULFATE HFA 108 (90 BASE) MCG/ACT IN AERS: INHALATION_SPRAY | RESPIRATORY_TRACT | 1 refills | Status: DC

## 2021-06-13 MED ORDER — TRELEGY ELLIPTA 200-62.5-25 MCG/ACT IN AEPB: 1.0000 | INHALATION_SPRAY | Freq: Every day | RESPIRATORY_TRACT | 2 refills | Status: DC

## 2021-06-13 MED ORDER — LEVOCETIRIZINE DIHYDROCHLORIDE 5 MG PO TABS: 5.0000 mg | ORAL_TABLET | Freq: Every evening | ORAL | 2 refills | Status: DC

## 2021-06-13 MED ORDER — OLOPATADINE HCL 0.2 % OP SOLN: OPHTHALMIC | 2 refills | Status: DC

## 2021-06-13 MED ORDER — EPINEPHRINE 0.3 MG/0.3ML IJ SOAJ: 0.3000 mg | Freq: Once | INTRAMUSCULAR | 1 refills | Status: AC

## 2021-06-13 NOTE — Patient Instructions (Addendum)
Severe persistent asthma ?- Lung testing looked good today. ?- We are going to the higher strength Trelegy.  ?- We are going to start the [employer].  ?- Daily controller medication(s): Trelegy 200/62.5/25 one puff once daily and Dupixent '300mg'$  every two weeks ?- Prior to physical activity: albuterol 2 puffs 10-15 minutes before physical activity. ?- Rescue medications: albuterol 4 puffs every 4-6 hours as needed ?- Asthma control goals:  ?* Full participation in all desired activities (may need albuterol before activity) ?* Albuterol use two time or less a week on average (not counting use with activity) ?* Cough interfering with sleep two time or less a month ?* Oral steroids no more than once a year ?* No hospitalizations ? ?2. Allergic rhinitis(cat dander, tree pollen, weed pollen, grass, and dog) ?- Continue Xyzal '5mg'$  daily to help with the nasal itching. ?- Continue Pataday one drop per eye daily. ? ?3. 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 ?- Continue to avoid all of these items above. ? ?4. Stinging insect allergy ?- Continue to avoid stinging insects. ?- EpiPen refilled today. ? ?5. Return in about 3 months (around 09/13/2021).  ? ? ?Please inform us of any Emergency Department visits, hospitalizations, or changes in symptoms. Call us before going to the ED for breathing or allergy symptoms since we might be able to fit you in for a sick visit. Feel free to contact us anytime with any questions, problems, or concerns. ? ?It was a pleasure to see you again today! ? ?Websites that have reliable patient information: ?1. American Academy of Asthma, Allergy, and Immunology: www.aaaai.org ?2. Food Allergy Research and Education (FARE): foodallergy.org ?3. Mothers of Asthmatics: http://www.asthmacommunitynetwork.org ?4. [employer] of Allergy, Asthma, and Immunology: MonthlyElectricBill.co.uk ? ? ?COVID-19 Vaccine Information can be found at:  ShippingScam.co.uk For questions related to vaccine distribution or appointments, please email [email or web address] or call [phone].  ? ?We realize that you might be concerned about having an allergic reaction to the COVID19 vaccines. To help with that concern, WE ARE OFFERING THE COVID19 VACCINES IN OUR OFFICE! Ask the front desk for dates!  ? ? ? ??Like? [place] on Facebook and Instagram for our latest updates!  ?  ? ? ?A healthy democracy works best when [employer] participate! Make sure you are registered to vote! If you have moved or changed any of your contact information, you will need to get this updated before voting! ? ?In some cases, you MAY be able to register to vote online: [email or web address] ? ? ? ? ? ? ? ?

## 2021-06-13 NOTE — Progress Notes (Signed)
? ?FOLLOW UP ? ?Date of Service/Encounter:  06/13/21 ? ? ?Assessment:  ? ?Allergic reaction - unknown trigger ?  ?Severe persistent asthma, uncomplicated ?  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  ?Non-seasonal allergic rhinitis (grass, weed, tree, cat, dog) ? ?Pumonary nodule - needs repeat chest CT in September or October 2023 ? ? ?Claire [name] presents following her ER visit.  She was a little freaked out because they diagnosed her with COPD.  I reassured her that COPD is a diagnosis made clinically and with appropriate spirometry findings.  Her spirometry as at previous visits is completely normal.  So I told her she did not have COPD.  She has never been a smoker and does not have chronic sputum production.  Regarding her pulmonary nodules, we are going to get repeat chest CT in 6 months is recommended.  If there is any change in them at that point, we will send her to pulmonology.  Again, she is not high risk for a lung neoplasm. ? ?Plan/Recommendations:  ? ?Severe persistent asthma ?- Lung testing looked good today. ?- We are going to the higher strength Trelegy.  ?- We are going to start the [place].  ?- Daily controller medication(s): Trelegy 200/62.5/25 one puff once daily and Dupixent '300mg'$  every two weeks ?- Prior to physical activity: albuterol 2 puffs 10-15 minutes before physical activity. ?- Rescue medications: albuterol 4 puffs every 4-6 hours as needed ?- Asthma control goals:  ?* Full participation in all desired activities (may need albuterol before activity) ?* Albuterol use two time or less a week on average (not counting use with activity) ?* Cough interfering with sleep two time or less a month ?* Oral steroids no more than once a year ?* No hospitalizations ? ?2. Allergic rhinitis(cat dander, tree pollen, weed pollen, grass, and dog) ?- Continue Xyzal '5mg'$  daily to help with the nasal itching. ?- Continue Pataday one drop  per eye daily. ? ?3. 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 ?- Continue to avoid all of these items above. ? ?4. Stinging insect allergy ?- Continue to avoid stinging insects. ?- EpiPen refilled today. ? ?5. Return in about 3 months (around 09/13/2021).  ? ? ?Subjective:  ? ?Claire [name] is a 51 y.o. female presenting today for follow up of  ?Chief Complaint  ?Patient presents with  ? Asthma  ? COPD  ? ? ?Claire [name] has a history of the following: ?Patient Active Problem List  ? Diagnosis Date Noted  ? PTSD (post-traumatic stress disorder) 05/10/2019  ? Eosinophilic esophagitis 37/90/2409  ? H. pylori infection 12/02/2016  ? Dysphagia 07/03/2016  ? Constipation 07/03/2016  ? Abdominal pain 07/03/2016  ? Generalized anxiety disorder 06/17/2016  ? Panic disorder 06/17/2016  ? Asthma in adult, mild intermittent, uncomplicated 73/53/2992  ? GERD without esophagitis 06/11/2016  ? 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 Frequent falls 06/11/2016  ? Vertigo 06/11/2016  ? History of syncope 06/11/2016  ? Fibromyalgia 07/28/2014  ? Seizure-like activity ([place]) 07/29/2012  ? ? ?History obtained from: chart review and patient. ? ?Claire [name] is a 51 y.o. female presenting for a sick visit.  She was last seen in November 2022.  At that time, her lung testing looked good.  We decided not to add Dupixent back.  We continue with Trelegy 100 mcg 1 puff once daily as well  as Singulair.  For her rhinitis, we stopped the RyVent and started Xyzal.  We continue with Pataday 1 drop per eye daily.  She continue to avoid all of her triggering foods.  EpiPen was up-to-date.  She avoided all for stinging insects. ? ?In the interim, she was having some breathing problems over the weekend. She called her PCP who told her to go to the ED. She went to the ED on [facility]. She had an EKG that was normal. CXR was normal. Chest CT showedincrease in size of pulmonary nodules. She was told  that she was diagnosed with COPD. She also had no idea that she had pulmonary nodules at all. She was never aware that she had pulmonary nodules. She has never seen a Pulmonologist. She was never a smoker, but she grew up in the 1970s.  ? ?Chest CT:  ? ? ?She is was placed on meclizine for vertigo at the ED. She also was started on ibuprofen. She was already on Keflex from her dentist. She apparently was eating brussel sprouts and was found to have 5 teeth that need to be removed. She is not on prednisone right now at all.  ? ?Asthma/Respiratory Symptom History: She does not feel that the [place] is working well. Even when she takes it, it does not open up her lungs like she used to.  It was working better than it is now. She reports that she is compliant with this medication. She is open to retrying the Dupixent. She only received a couple of doses before she stopped because there was something wrong with the approval paperwork.  ? ?Allergic Rhinitis Symptom History: She remains on the Xyzal 5 mg daily. She is also on the eye drops. This seems to be working well.  She has not been on antibiotics at all since the last visit.  ? ?Food Allergy Symptom History: She continues to avoid wheat, rye, barley, and oat.  She has not had any accidental ingestions.  She does have an up-to-date EpiPen. ? ?Otherwise, there have been no changes to her past medical history, surgical history, family history, or social history. ? ? ? ?Review of Systems  ?Constitutional: Negative.  Negative for chills, fever, malaise/fatigue and weight loss.  ?HENT: Negative.  Negative for congestion, ear discharge, ear pain, sinus pain and sore throat.   ?Eyes:  Negative for pain, discharge and redness.  ?Respiratory:  Positive for cough and shortness of breath. Negative for sputum production, wheezing and stridor.   ?Cardiovascular: Negative.  Negative for chest pain and palpitations.  ?Gastrointestinal:  Negative for abdominal pain, diarrhea,  heartburn, nausea and vomiting.  ?Skin: Negative.  Negative for itching and rash.  ?Neurological:  Negative for dizziness and headaches.  ?Endo/Heme/Allergies:  Negative for environmental allergies. Does not bruise/bleed easily.   ? ? ? ?Objective:  ? ?Blood pressure 112/82, pulse 83, resp. rate 17, height '5\' 2"'$  (1.575 m), weight 139 lb 6.4 oz (63.2 kg), SpO2 99 %. ?Body mass index is 25.5 kg/m?. ? ? ? ?Physical Exam ?Vitals reviewed.  ?Constitutional:   ?   Appearance: She is well-developed and underweight.  ?HENT:  ?   Head: Normocephalic and atraumatic.  ?   Right Ear: Tympanic membrane, ear canal and external ear normal.  ?   Left Ear: Tympanic membrane, ear canal and external ear normal.  ?   Nose: No nasal deformity, septal deviation, mucosal edema or rhinorrhea.  ?   Right Turbinates: Enlarged, swollen and pale.  ?  Left Turbinates: Enlarged, swollen and pale.  ?   Right Sinus: No maxillary sinus tenderness or frontal sinus tenderness.  ?   Left Sinus: No maxillary sinus tenderness or frontal sinus tenderness.  ?   Comments: No nasal polyps noted.  ?   Mouth/Throat:  ?   Mouth: Mucous membranes are not pale and not dry.  ?   Pharynx: Uvula midline.  ?Eyes:  ?   General: Lids are normal. No allergic shiner.    ?   Right eye: No discharge.     ?   Left eye: No discharge.  ?   Conjunctiva/sclera: Conjunctivae normal.  ?   Right eye: Right conjunctiva is not injected. No chemosis. ?   Left eye: Left conjunctiva is not injected. No chemosis. ?   Pupils: Pupils are equal, round, and reactive to light.  ?Cardiovascular:  ?   Rate and Rhythm: Normal rate and regular rhythm.  ?   Heart sounds: Normal heart sounds.  ?Pulmonary:  ?   Effort: Pulmonary effort is normal. No tachypnea, accessory muscle usage or respiratory distress.  ?   Breath sounds: Normal breath sounds. No wheezing, rhonchi or rales.  ?   Comments: Moving air well in all lung fields. No increased work of breathing noted.  ?Chest:  ?   Chest wall: No  tenderness.  ?Lymphadenopathy:  ?   Cervical: No cervical adenopathy.  ?Skin: ?   General: Skin is warm.  ?   Capillary Refill: Capillary refill takes less than 2 seconds.  ?   Coloration: Skin is not pale.  ?

## 2021-06-14 ENCOUNTER — Telehealth: Payer: Self-pay | Admitting: *Deleted

## 2021-06-14 NOTE — Telephone Encounter (Signed)
L/m for patient to contact me to advise approval and submit for Dupixent for her asthma ?

## 2021-06-14 NOTE — Telephone Encounter (Signed)
Spoke to patient and advised Rx to [facility] for [place] with delivery, storage and make appt with initial loading dose for in clinic admin isntrux ?

## 2021-06-14 NOTE — Telephone Encounter (Signed)
Great - thank you! Efficiency!  ? ?Salvatore Marvel, MD ?[facility] ? ?

## 2021-06-14 NOTE — Telephone Encounter (Signed)
-----   Message from Valentina Shaggy, MD sent at 06/13/2021  3:28 PM EDT ----- ?Patient wants to restart Dupixent. ?

## 2021-06-29 ENCOUNTER — Ambulatory Visit: Payer: Medicare Other

## 2021-07-04 ENCOUNTER — Ambulatory Visit (INDEPENDENT_AMBULATORY_CARE_PROVIDER_SITE_OTHER): Payer: Medicare Other

## 2021-07-04 DIAGNOSIS — J4540 Moderate persistent asthma, uncomplicated: Secondary | ICD-10-CM | POA: Diagnosis not present

## 2021-07-04 NOTE — Progress Notes (Signed)
Immunotherapy ? ?Patient Details  ?Name: Claire [name] ?MRN: [id number] ?Date of Birth: May 05, 1970 ? ?07/04/2021 ? ?Kendra Opitz Yaun started injections for  Dupixent. ?Following schedule: Every Two Weeks ?Frequency:Bi Weekly ?Epi-Pen:Yes ?Consent signed and patient instructions given. Patient injected herself in the abdomen and waited in the lobby for thirty minutes without an issue.  ? ? ?Julius Bowels ?07/04/2021, 5:02 PM ? ? ?

## 2021-07-19 ENCOUNTER — Telehealth: Payer: Self-pay

## 2021-07-19 DIAGNOSIS — T1490XA Injury, unspecified, initial encounter: Secondary | ICD-10-CM
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
## 2021-07-19 NOTE — Telephone Encounter (Signed)
Chest x-ray ordered for [facility].  Measure how she will get there if she does not have a ride to urgent care, but I guess she can figure that out. ? ?Salvatore Marvel, MD ?[facility] [place] [facility] ? ?

## 2021-08-01 ENCOUNTER — Ambulatory Visit: Payer: Medicare Other | Admitting: Allergy & Immunology

## 2021-08-03 ENCOUNTER — Ambulatory Visit: Payer: Medicare Other | Admitting: Allergy & Immunology

## 2021-08-24 ENCOUNTER — Other Ambulatory Visit: Payer: Self-pay

## 2021-08-24 ENCOUNTER — Ambulatory Visit (HOSPITAL_COMMUNITY)
Admission: RE | Admit: 2021-08-24 | Discharge: 2021-08-24 | Disposition: A | Discharge status: Home / Self Care | Payer: Medicare Other | Source: Ambulatory Visit | Attending: Allergy & Immunology | Admitting: Allergy & Immunology

## 2021-08-24 ENCOUNTER — Emergency Department (HOSPITAL_COMMUNITY)
Admission: EM | Admit: 2021-08-24 | Discharge: 2021-08-24 | Disposition: A | Discharge status: Home / Self Care | Payer: Medicare Other | Attending: Emergency Medicine | Admitting: Emergency Medicine

## 2021-08-24 ENCOUNTER — Telehealth: Payer: Self-pay

## 2021-08-24 ENCOUNTER — Encounter (HOSPITAL_COMMUNITY): Payer: Self-pay | Admitting: Emergency Medicine

## 2021-08-24 DIAGNOSIS — R0602 Shortness of breath: Secondary | ICD-10-CM | POA: Diagnosis not present

## 2021-08-24 DIAGNOSIS — R0789 Other chest pain: Secondary | ICD-10-CM | POA: Diagnosis not present

## 2021-08-24 DIAGNOSIS — X58XXXA Exposure to other specified factors, initial encounter: Secondary | ICD-10-CM | POA: Insufficient documentation

## 2021-08-24 DIAGNOSIS — R059 Cough, unspecified: Secondary | ICD-10-CM | POA: Insufficient documentation

## 2021-08-24 DIAGNOSIS — R079 Chest pain, unspecified: Secondary | ICD-10-CM

## 2021-08-24 DIAGNOSIS — T1490XA Injury, unspecified, initial encounter: Secondary | ICD-10-CM

## 2021-08-24 LAB — CBC
HCT: 41.8 % (ref 36.0–46.0)
Hemoglobin: 14.5 g/dL (ref 12.0–15.0)
MCH: 32.9 pg (ref 26.0–34.0)
MCHC: 34.7 g/dL (ref 30.0–36.0)
MCV: 94.8 fL (ref 80.0–100.0)
Platelets: 391 10*3/uL (ref 150–400)
RBC: 4.41 MIL/uL (ref 3.87–5.11)
RDW: 11.9 % (ref 11.5–15.5)
WBC: 7.2 10*3/uL (ref 4.0–10.5)
nRBC: 0 % (ref 0.0–0.2)

## 2021-08-24 LAB — BASIC METABOLIC PANEL
Anion gap: 6 (ref 5–15)
BUN: 12 mg/dL (ref 6–20)
CO2: 25 mmol/L (ref 22–32)
Calcium: 9.5 mg/dL (ref 8.9–10.3)
Chloride: 105 mmol/L (ref 98–111)
Creatinine, Ser: 0.72 mg/dL (ref 0.44–1.00)
GFR, Estimated: >60 mL/min (ref >60)
Glucose, Bld: 86 mg/dL (ref 70–99)
Potassium: 4 mmol/L (ref 3.5–5.1)
Sodium: 136 mmol/L (ref 135–145)

## 2021-08-24 LAB — TROPONIN I (HIGH SENSITIVITY)
Troponin I (High Sensitivity): <2 ng/L (ref <18)
Troponin I (High Sensitivity): <2 ng/L (ref <18)

## 2021-08-24 MED ORDER — LEVOCETIRIZINE DIHYDROCHLORIDE 5 MG PO TABS: 5.0000 mg | ORAL_TABLET | Freq: Every evening | ORAL | 5 refills | Status: DC

## 2021-08-24 MED ORDER — TRELEGY ELLIPTA 200-62.5-25 MCG/ACT IN AEPB: 1.0000 | INHALATION_SPRAY | Freq: Every day | RESPIRATORY_TRACT | 5 refills | Status: DC

## 2021-08-24 MED ORDER — OLOPATADINE HCL 0.2 % OP SOLN: OPHTHALMIC | 5 refills | Status: DC

## 2021-08-24 MED ORDER — ALBUTEROL SULFATE HFA 108 (90 BASE) MCG/ACT IN AERS: INHALATION_SPRAY | RESPIRATORY_TRACT | 1 refills | Status: DC

## 2021-08-24 NOTE — Addendum Note (Signed)
Addended by: Hedy Camara L on: 08/24/2021 11:38 AM   Modules accepted: Orders

## 2021-08-24 NOTE — Telephone Encounter (Signed)
Agree with the plan. Thanks, Jeraldine Loots!   Salvatore Marvel, MD [facility] [place]
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
## 2021-08-24 NOTE — ED Triage Notes (Addendum)
Pt c/o sob, pain to back and mid chest. Symptoms x one month every day. A/o. Nad. No resp distress or sob noted. Non diaphoretic. Pt then states "plus I'm getting a migraine". Pt just had chest xray done as outpatient pta to ED.

## 2021-08-24 NOTE — Discharge Instructions (Addendum)
Your x-ray did not note any nodules or pneumonia.  Your cardiac heart tests were normal.  Follow-up with your primary care doctor this week.  Return back to the ER if you have fevers worsening symptoms or any additional concerns.

## 2021-08-29 ENCOUNTER — Ambulatory Visit: Payer: Medicare Other | Admitting: Neurology

## 2021-09-06 ENCOUNTER — Other Ambulatory Visit: Payer: Self-pay | Admitting: Gastroenterology

## 2021-09-06 ENCOUNTER — Other Ambulatory Visit: Payer: Self-pay | Admitting: Allergy & Immunology

## 2021-09-11 ENCOUNTER — Encounter: Payer: Self-pay | Admitting: Internal Medicine

## 2021-09-23 ENCOUNTER — Other Ambulatory Visit: Payer: Self-pay | Admitting: Neurology

## 2021-09-24 NOTE — Telephone Encounter (Signed)
Rx refilled.

## 2021-09-26 ENCOUNTER — Ambulatory Visit (INDEPENDENT_AMBULATORY_CARE_PROVIDER_SITE_OTHER): Payer: Medicare Other | Admitting: Allergy & Immunology

## 2021-09-26 ENCOUNTER — Encounter: Payer: Self-pay | Admitting: Allergy & Immunology

## 2021-09-26 VITALS — BP 108/82 | HR 84 | Temp 98.4°F | Resp 16 | Ht 63.0 in | Wt 143.5 lb

## 2021-09-26 DIAGNOSIS — J4540 Moderate persistent asthma, uncomplicated: Secondary | ICD-10-CM | POA: Diagnosis not present

## 2021-09-26 DIAGNOSIS — J3089 Other allergic rhinitis: Secondary | ICD-10-CM | POA: Diagnosis not present

## 2021-09-26 DIAGNOSIS — T7800XA Anaphylactic reaction due to unspecified food, initial encounter: Secondary | ICD-10-CM | POA: Diagnosis not present

## 2021-09-26 NOTE — Patient Instructions (Addendum)
Severe persistent asthma - Lung testing looked perfect today. - I do not think that your shortness of breath is related to asthma at this point. - We are going to get you plugged back into Cardiology for evaluation of possible cardiac problems.  - Daily controller medication(s): Trelegy 200/62.5/25 one puff once daily and Dupixent '300mg'$  every two weeks - Prior to physical activity: albuterol 2 puffs 10-15 minutes before physical activity. - Rescue medications: albuterol 4 puffs every 4-6 hours as needed - Asthma control goals:  * Full participation in all desired activities (may need albuterol before activity) * Albuterol use two time or less a week on average (not counting use with activity) * Cough interfering with sleep two time or less a month * Oral steroids no more than once a year * No hospitalizations  2. Allergic rhinitis(cat dander, tree pollen, weed pollen, grass, and dog) - Continue Xyzal '5mg'$  daily to help with the nasal itching. - Continue Pataday one drop per eye daily.  3. 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 - Continue to avoid all of these items above.  4. 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 - EpiPen refilled today.  5. Return in about 3 months (around 12/27/2021).    Please inform us of any Emergency Department visits, hospitalizations, or changes in symptoms. Call us before going to the ED for breathing or allergy symptoms since we might be able to fit you in for a sick visit. Feel free to contact us anytime with any questions, problems, or concerns.  It was a pleasure to see you again today!  Websites that have reliable patient information: 1. American Academy of Asthma, Allergy, and Immunology: www.aaaai.org 2. Food Allergy Research and Education (FARE): foodallergy.org 3. Mothers of Asthmatics: http://www.asthmacommunitynetwork.org 4. American College of Allergy,  Asthma, and Immunology: www.acaai.org   COVID-19 Vaccine Information can be found at: ShippingScam.co.uk For questions related to vaccine distribution or appointments, please email [email or web address] or call [phone].   We realize that you might be concerned about having an allergic reaction to the COVID19 vaccines. To help with that concern, WE ARE OFFERING THE COVID19 VACCINES IN OUR OFFICE! Ask the front desk for dates!    "Like" [place] on Facebook and Instagram for our latest updates!      A healthy democracy works best when [employer] participate! Make sure you are registered to vote! If you have moved or changed any of your contact information, you will need to get this updated before voting!  In some cases, you MAY be able to register to vote online: [email or web address]

## 2021-09-26 NOTE — Progress Notes (Signed)
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 - I do not think that your shortness of breath is related to asthma at this point. - We are going to get you plugged back into Cardiology for evaluation of possible cardiac problems.  - Daily controller medication(s): Trelegy 200/62.5/25 one puff once daily and Dupixent '300mg'$  every two weeks - Prior to physical activity: albuterol 2 puffs 10-15 minutes before physical activity. - Rescue medications: albuterol 4 puffs every 4-6 hours as needed - Asthma control goals:  * Full participation in all desired activities (may need albuterol before activity) * Albuterol use two time or less a week on average (not counting use with activity) * Cough interfering with sleep two time or less a month * Oral steroids no more than once a year * No hospitalizations  2. Allergic rhinitis(cat dander, tree pollen, weed pollen, grass, and dog) - Continue Xyzal '5mg'$  daily to help with the nasal itching. - Continue Pataday one drop per eye daily.  3. 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 - Continue to avoid all of these items above.  4. 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 - EpiPen refilled today.  5. Return in about 3 months (around 12/27/2021).    Subjective:   Claire [name] is a 51 y.o. female presenting today for follow up of  Chief Complaint  Patient presents with   Asthma    Has been having to use albuterol  and neb more than normal. Once a week neb/ albuterol daily   Other    Is still worried about the nodule on lung    Claire [name] has a history of the following: Patient Active Problem List   Diagnosis Date Noted   PTSD (post-traumatic stress disorder) 18/56/3149   Eosinophilic esophagitis 70/26/3785   H. pylori infection 12/02/2016   Dysphagia 07/03/2016   Constipation 07/03/2016   Abdominal pain 07/03/2016   Generalized anxiety disorder 06/17/2016   Panic disorder 06/17/2016   Asthma in adult, mild intermittent, uncomplicated 88/50/2774   GERD without esophagitis 06/11/2016   TMJ arthropathy 06/11/2016   Frequent falls 06/11/2016   Vertigo 06/11/2016   History of syncope 06/11/2016   Fibromyalgia 07/28/2014   Seizure-like activity ([place]) 07/29/2012    History obtained from: chart review and patient.  Claire [name] is a 51 y.o. female presenting for a follow up visit.  She was last seen in March 2023.  At that time, we did lung testing which looked great.  We continued with Trelegy 200 mcg 1 puff twice daily and Dupixent.  Milligrams every 2 weeks.  For her allergic rhinitis, we continue with Xyzal as well as Pataday.  She continued to avoid certain grains.  EpiPen is up-to-date.  Since the last bisit, she has done well.   Asthma/Respiratory Symptom History: She  has done well. She has been using her rescue inhaler several times for the last few months. This is even with her Trelegy and the Dupixent. She reports  that she gets out of breath with cleaning around her home or even walking. She does not have any heart issues to her knowledge. She has seen Cardiology in the past - Dr. Bronson Ing - in the past. At the last visit in April 2021, it was felt that she had SOB due to uncontrolled asthma. This was around the time that she established care with our clinic (initially saw Dr. Nelva Bush in February 2021). She has been told that "everything looks good", but she is not convinced. She had a  negative troponin level. This was last month when she was at [facility]. She went to the ED for vomiting and stomach pain  She has not had a repeat chest CT yet, but it is scheduled for September 2023. This is to follow up on her pulmonary nodules.  Allergic Rhinitis Symptom History: She remains allergic to her cat and dog.  She was under the impression that the [place] might help with that. I explained that this was not my intention with starting [place], although it can certainly help with rhinitis symptoms. She remains on the Xyzal as well as Pataday one drop per eye daily.   Food Allergy Symptom History: She continues to avoid wheat, rye, barley, and oat. She has an up to date EpiPen on hand.  She had testing performed in February 2021 that demonstrated elevated IgE to these foods (wheat 5.34, barley 6.55, rye 5.06, and oat 2.40).   Otherwise, there have been no changes to her past medical history, surgical history, family history, or social history.    Review of Systems  Constitutional: Negative.  Negative for chills, fever, malaise/fatigue and weight loss.  HENT: Negative.  Negative for congestion, ear discharge, ear pain, sinus pain and sore throat.   Eyes:  Negative for pain, discharge and redness.  Respiratory:  Positive for cough and shortness of breath. Negative for sputum production, wheezing and stridor.   Cardiovascular: Negative.  Negative for chest pain and palpitations.  Gastrointestinal:  Negative for abdominal pain, diarrhea, heartburn, nausea and vomiting.  Skin: Negative.  Negative for itching and rash.  Neurological:  Negative for dizziness and headaches.  Endo/Heme/Allergies:  Negative for environmental allergies. Does not bruise/bleed easily.       Objective:   Blood pressure 108/82, pulse 84, temperature 98.4 F (36.9 C), resp. rate 16, height '5\' 3"'$  (1.6 m), weight 143 lb 8 oz (65.1 kg), SpO2 98 %. Body mass index is 25.42 kg/m.    Physical Exam Vitals  reviewed.  Constitutional:      Appearance: She is well-developed and underweight.  HENT:     Head: Normocephalic and atraumatic.     Right Ear: Tympanic membrane, ear canal and external ear normal.     Left Ear: Tympanic membrane, ear canal and external ear normal.     Nose: No nasal deformity, septal deviation, mucosal edema or rhinorrhea.     Right Turbinates: Enlarged, swollen and pale.     Left Turbinates: Enlarged, swollen and pale.     Right Sinus: No maxillary sinus tenderness or frontal sinus tenderness.     Left Sinus: No maxillary sinus tenderness or frontal sinus tenderness.     Comments: No nasal polyps noted.     Mouth/Throat:     Mouth: Mucous membranes are not pale and not dry.     Pharynx: Uvula midline.  Eyes:     General: Lids are normal. No allergic shiner.       Right eye: No discharge.  Left eye: No discharge.     Conjunctiva/sclera: Conjunctivae normal.     Right eye: Right conjunctiva is not injected. No chemosis.    Left eye: Left conjunctiva is not injected. No chemosis.    Pupils: Pupils are equal, round, and reactive to light.  Cardiovascular:     Rate and Rhythm: Normal rate and regular rhythm.     Heart sounds: Normal heart sounds.  Pulmonary:     Effort: Pulmonary effort is normal. No tachypnea, accessory muscle usage, prolonged expiration or respiratory distress.     Breath sounds: Normal breath sounds. No wheezing, rhonchi or rales.     Comments: Moving air well in all lung fields. No increased work of breathing noted.  Chest:     Chest wall: No tenderness.  Lymphadenopathy:     Cervical: No cervical adenopathy.  Skin:    General: Skin is warm.     Capillary Refill: Capillary refill takes less than 2 seconds.     Coloration: Skin is not pale.     Findings: No abrasion, erythema, petechiae or rash. Rash is not papular, urticarial or vesicular.  Neurological:     Mental Status: She is alert.  Psychiatric:        Behavior: Behavior is  cooperative.      Diagnostic studies:    Spirometry: results normal (FEV1: 2.67/100%, FVC: 3.29/98%, FEV1/FVC: 81%).    Spirometry consistent with normal pattern.   Allergy Studies: none       Salvatore Marvel, MD  [facility] [place]

## 2021-09-27 ENCOUNTER — Encounter: Payer: Self-pay | Admitting: Allergy & Immunology

## 2021-10-05 ENCOUNTER — Telehealth: Payer: Self-pay

## 2021-10-05 NOTE — Telephone Encounter (Signed)
-----   Message from Valentina Shaggy, MD sent at 09/27/2021  5:22 AM EDT ----- Hi there! This patient saw Dr. Bronson Ing in the past with Cardiology. She tells me that he has since left. We can just make an appt with someone else at the practice without a new referral, right? She wants to see someone in their [place] or [place] office. Thanks!

## 2021-11-30 ENCOUNTER — Other Ambulatory Visit: Payer: Self-pay | Admitting: Gastroenterology

## 2021-12-01 ENCOUNTER — Other Ambulatory Visit: Payer: Self-pay | Admitting: Neurology

## 2021-12-01 ENCOUNTER — Other Ambulatory Visit: Payer: Self-pay | Admitting: Allergy & Immunology

## 2021-12-03 NOTE — Telephone Encounter (Signed)
Pt's Linzess 290 mcg caps denied. Pt needs ov. Pt last here 06/07/2019

## 2021-12-25 ENCOUNTER — Ambulatory Visit: Payer: Medicare Other | Attending: Cardiology | Admitting: Cardiology

## 2021-12-25 ENCOUNTER — Encounter: Payer: Self-pay | Admitting: Cardiology

## 2021-12-25 VITALS — BP 116/84 | HR 84 | Ht 64.0 in | Wt 139.0 lb

## 2021-12-25 DIAGNOSIS — R0789 Other chest pain: Secondary | ICD-10-CM | POA: Diagnosis not present

## 2021-12-25 DIAGNOSIS — R55 Syncope and collapse: Secondary | ICD-10-CM | POA: Diagnosis not present

## 2021-12-25 NOTE — Progress Notes (Signed)
Cardiology Office Note:    Date:  12/25/2021   ID:  Claire [name], DOB 10-May-1970, MRN [id number]  PCP:  [facility] [place] Providers Cardiologist:  Candee Furbish, MD     Referring MD: Valentina Shaggy, *    History of Present Illness:    Claire [name] is a 51 y.o. female seen last in cardiology clinic 10/15/2019 for follow-up of near syncope.  Prior EKG showed sinus tachycardia rate 112 with nonspecific ST changes.  She was having episodes of near syncope.  Lightheadedness.  Past 5 to 6 years previously.  Had chronic exertional dyspnea.  Had asthma.  Drinking quite a bit of water daily.  74 ounces.  My card is previously was reduced.  Orthostatics were taken at one visit and she was found to be hypotensive.  Echo was normal.  To decreasing the Micardis she felt much better.  No strokelike symptoms.  Today she states that she has been doing well.  Very rarely does she feel her heart race but usually only last a few minutes in duration and is not associated with any other symptoms.  She will occasionally have chest discomfort under her right breast along the rib region laterally.  Has fibromyalgia.  Nonexertional pain.  Overall feels as though she is doing quite well.  Past Medical History:  Diagnosis Date   Acid reflux    Allergy    pollen, dust   Anxiety    Arthritis    Asthma    Carpal tunnel syndrome    bilateral   DDD (degenerative disc disease), cervical    Depression    Fibromyalgia    Hypertension    Migraine    Palpitations    Panic attacks    PTSD (post-traumatic stress disorder)    Seizures ([place])    unknown etiology; last seizure was 2 years ago; on meds.   Syncope and collapse    Tennis elbow    Ulcer    Vertigo     Past Surgical History:  Procedure Laterality Date   BACK SURGERY  1993   BIOPSY  10/21/2016   Procedure: BIOPSY;  Surgeon: Daneil Dolin, MD;  Location: [facility];  Service: Endoscopy;;  gastric,  esophagus   CHOLECYSTECTOMY     ESOPHAGOGASTRODUODENOSCOPY (EGD) WITH PROPOFOL N/A 10/21/2016   Procedure: ESOPHAGOGASTRODUODENOSCOPY (EGD) WITH PROPOFOL;  Surgeon: Daneil Dolin, MD;  Location: [facility];  Service: Endoscopy;  Laterality: N/A;  9:30am   GALLBLADDER SURGERY     SHOULDER SURGERY Right    SPINE SURGERY  1993   lumbar disc surgery    Current Medications: Current Meds  Medication Sig   albuterol (PROVENTIL) (2.5 MG/3ML) 0.083% nebulizer solution Take 3 mLs (2.5 mg total) by nebulization every 4 (four) hours as needed for wheezing or shortness of breath.   albuterol (VENTOLIN HFA) 108 (90 Base) MCG/ACT inhaler Inhale two puffs every 4-6 hours if needed for cough or wheeze.   chlorthalidone (HYGROTON) 25 MG tablet Take 25 mg by mouth every morning.   DUPIXENT 300 MG/2ML prefilled syringe Inject into the skin.   escitalopram (LEXAPRO) 20 MG tablet Take 1 tablet (20 mg total) by mouth daily.   hydrOXYzine (ATARAX) 50 MG tablet Take 50 mg by mouth at bedtime as needed.   ibuprofen (ADVIL) 800 MG tablet Take 800 mg by mouth every 8 (eight) hours as needed.   levETIRAcetam (KEPPRA) 500 MG tablet TAKE 1 TABLET BY MOUTH  TWICE DAILY **MUST  COME  TO  APPOINTMENT**   levocetirizine (XYZAL) 5 MG tablet Take 1 tablet (5 mg total) by mouth every evening.   LINZESS 290 MCG CAPS capsule TAKE 1 CAPSULE BY MOUTH ONCE DAILY 30  MINUTES  BEFORE  BREAKFAST   methocarbamol (ROBAXIN) 500 MG tablet Take 500 mg by mouth 3 (three) times daily as needed.   metoprolol succinate (TOPROL-XL) 25 MG 24 hr tablet Take 1 tablet (25 mg total) by mouth daily.   mirtazapine (REMERON) 45 MG tablet Take 1 tablet (45 mg total) by mouth at bedtime.   montelukast (SINGULAIR) 10 MG tablet TAKE 1 TABLET BY MOUTH AT BEDTIME   Olopatadine HCl (PATADAY) 0.2 % SOLN Place 1 drop each eye once a day as needed for itchy watery eyes   omeprazole (PRILOSEC) 40 MG capsule TAKE 1 CAPSULE BY MOUTH ONCE DAILY BEFORE BREAKFAST    oxyCODONE-acetaminophen (PERCOCET) 10-325 MG tablet Take 1 tablet by mouth every 6 (six) hours as needed.   SAVELLA 50 MG TABS tablet Take 50 mg by mouth 2 (two) times daily.   telmisartan (MICARDIS) 40 MG tablet Take 40 mg by mouth daily.   TRELEGY ELLIPTA 200-62.5-25 MCG/ACT AEPB Inhale 1 puff into the lungs daily.   triamcinolone ointment (KENALOG) 0.1 % Apply 1 application topically 2 (two) times daily.     Allergies:   Baclofen, Duloxetine, Penicillins, Sulfa antibiotics, Benadryl [diphenhydramine], Gluten meal, Penicillin v potassium, Claritin [loratadine], Duloxetine hcl, and Ultram [tramadol]   Social History   Socioeconomic History   Marital status: Single    Spouse name: Not on file   Number of children: 1   Years of education: HS   Highest education level: Not on file  Occupational History   Occupation: disability    Comment: unemployed  Tobacco Use   Smoking status: Never   Smokeless tobacco: Never  Vaping Use   Vaping Use: Never used  Substance and Sexual Activity   Alcohol use: Not Currently    Comment: drink occasionally   Drug use: No   Sexual activity: Not Currently    Partners: Female    Birth control/protection: None  Other Topics Concern   Not on file  Social History Narrative   Patient is left handed.   Patient drinks very little caffeine.   Lives alone   Social Determinants of Health   Financial Resource Strain: Not on file  Food Insecurity: Not on file  Transportation Needs: Not on file  Physical Activity: Not on file  Stress: Not on file  Social Connections: Not on file     Family History: The patient's family history includes Alcohol abuse in her father; Arthritis in her mother; Bronchitis in her mother; COPD in her mother; Colon cancer in her maternal grandmother; Depression in her mother; Early death in her father; Heart disease in her mother; Hypertension in her mother; PKU in her son.  ROS:   Please see the history of present  illness.     All other systems reviewed and are negative.  EKGs/Labs/Other Studies Reviewed:    The following studies were reviewed today: Echocardiogram Jul 21, 2019.   1. Left ventricular ejection fraction, by estimation, is 60 to 65%. The left ventricle has normal function. The left ventricle has no regional wall motion abnormalities. Left ventricular diastolic parameters were normal. 2. Right ventricular systolic function is normal. The right ventricular size is normal. There is normal pulmonary artery systolic pressure. 3. The mitral valve is normal in  structure. No evidence of mitral valve regurgitation. No evidence of mitral stenosis. 4. The aortic valve was not well visualized. Aortic valve regurgitation is not visualized. No aortic stenosis is present.  Recent Labs: 08/24/2021: BUN 12; Creatinine, Ser 0.72; Hemoglobin 14.5; Platelets 391; Potassium 4.0; Sodium 136  Recent Lipid Panel No results found for: "CHOL", "TRIG", "HDL", "CHOLHDL", "VLDL", "LDLCALC", "LDLDIRECT"   Risk Assessment/Calculations:              Physical Exam:    VS:  BP 116/84   Pulse 84   Ht '5\' 4"'$  (1.626 m)   Wt 139 lb (63 kg)   SpO2 98%   BMI 23.86 kg/m     Wt Readings from Last 3 Encounters:  12/25/21 139 lb (63 kg)  09/26/21 143 lb 8 oz (65.1 kg)  06/13/21 139 lb 6.4 oz (63.2 kg)     GEN:  Well nourished, well developed in no acute distress HEENT: Normal NECK: No JVD; No carotid bruits LYMPHATICS: No lymphadenopathy CARDIAC: RRR, no murmurs, no rubs, gallops RESPIRATORY:  Clear to auscultation without rales, wheezing or rhonchi  ABDOMEN: Soft, non-tender, non-distended MUSCULOSKELETAL:  No edema; No deformity  SKIN: Warm and dry NEUROLOGIC:  Alert and oriented x 3 PSYCHIATRIC:  Normal affect   ASSESSMENT:    1. Atypical chest pain   2. Near syncope    PLAN:    In order of problems listed above:  Noncardiac chest pain - Likely musculoskeletal right-sided discomfort.  She  thinks it may be associated with her fibromyalgia.  Agree that this is cardiac in origin.  Prior troponins have been normal.  Near syncope - Previously reduced Micardis to 40 mg and this helped out significantly.  Has not had any episodes recently.  Continue with water.  Orthostatic hypotension - Previously noted.  Fluid liberalization.  Much improved after decreasing Micardis.  Dyspnea on exertion - Episodes used to come and go.  More prominent during allergy season.  Hypokalemia - She states that this predated use of chlorthalidone.  Takes liquid supplementation.  Please let us know if we can be of further assistance.  I think would be fine for her to see [place] on an as-needed basis.      Medication Adjustments/Labs and Tests Ordered: Current medicines are reviewed at length with the patient today.  Concerns regarding medicines are outlined above.  No orders of the defined types were placed in this encounter.  No orders of the defined types were placed in this encounter.   Patient Instructions  Medication Instructions:  Your physician recommends that you continue on your current medications as directed. Please refer to the Current Medication list given to you today.  *If you need a refill on your cardiac medications before your next appointment, please call your pharmacy*   Lab Work: NONE   If you have labs (blood work) drawn today and your tests are completely normal, you will receive your results only by: [facility] (if you have MyChart) OR A paper copy in the mail If you have any lab test that is abnormal or we need to change your treatment, we will call you to review the results.   Testing/Procedures: NONE    Follow-Up: At [facility], you and your health needs are our priority.  As part of our continuing mission to provide you with exceptional heart care, we have created designated Provider Care Teams.  These Care Teams include your primary  Cardiologist (physician) and Advanced Practice Providers (APPs -  Physician Assistants and Nurse Practitioners) who all work together to provide you with the care you need, when you need it.  We recommend signing up for the patient portal called "MyChart".  Sign up information is provided on this After Visit Summary.  MyChart is used to connect with patients for Virtual Visits (Telemedicine).  Patients are able to view lab/test results, encounter notes, upcoming appointments, etc.  Non-urgent messages can be sent to your provider as well.   To learn more about what you can do with MyChart, go to [email or web address].    Your next appointment:    As Needed   The format for your next appointment:   In Person  Provider:   You may see Candee Furbish, MD or one of the following Advanced Practice Providers on your designated Care Team:   Bernerd Pho, PA-C  Ermalinda Barrios, PA-C     Other Instructions Thank you for choosing [facility]!    Important Information About Sugar         Signed, Candee Furbish, MD  12/25/2021 2:19 PM    [facility]

## 2021-12-25 NOTE — Patient Instructions (Signed)
Medication Instructions:  Your physician recommends that you continue on your current medications as directed. Please refer to the Current Medication list given to you today.  *If you need a refill on your cardiac medications before your next appointment, please call your pharmacy*   Lab Work: NONE   If you have labs (blood work) drawn today and your tests are completely normal, you will receive your results only by: [facility] (if you have MyChart) OR A paper copy in the mail If you have any lab test that is abnormal or we need to change your treatment, we will call you to review the results.   Testing/Procedures: NONE    Follow-Up: At [facility], you and your health needs are our priority.  As part of our continuing mission to provide you with exceptional heart care, we have created designated Provider Care Teams.  These Care Teams include your primary Cardiologist (physician) and Advanced Practice Providers (APPs -  Physician Assistants and Nurse Practitioners) who all work together to provide you with the care you need, when you need it.  We recommend signing up for the patient portal called "MyChart".  Sign up information is provided on this After Visit Summary.  MyChart is used to connect with patients for Virtual Visits (Telemedicine).  Patients are able to view lab/test results, encounter notes, upcoming appointments, etc.  Non-urgent messages can be sent to your provider as well.   To learn more about what you can do with MyChart, go to [email or web address].    Your next appointment:    As Needed   The format for your next appointment:   In Person  Provider:   You may see Candee Furbish, MD or one of the following Advanced Practice Providers on your designated Care Team:   Bernerd Pho, PA-C  Ermalinda Barrios, PA-C     Other Instructions Thank you for choosing [facility]!    Important Information About Sugar

## 2021-12-28 ENCOUNTER — Encounter: Payer: Self-pay | Admitting: Allergy & Immunology

## 2021-12-28 ENCOUNTER — Ambulatory Visit (INDEPENDENT_AMBULATORY_CARE_PROVIDER_SITE_OTHER): Payer: Medicare Other | Admitting: Allergy & Immunology

## 2021-12-28 VITALS — BP 118/88 | HR 97 | Temp 98.4°F | Resp 18

## 2021-12-28 DIAGNOSIS — J3089 Other allergic rhinitis: Secondary | ICD-10-CM | POA: Diagnosis not present

## 2021-12-28 DIAGNOSIS — J4540 Moderate persistent asthma, uncomplicated: Secondary | ICD-10-CM

## 2021-12-28 DIAGNOSIS — T7800XA Anaphylactic reaction due to unspecified food, initial encounter: Secondary | ICD-10-CM

## 2021-12-28 MED ORDER — LEVOCETIRIZINE DIHYDROCHLORIDE 5 MG PO TABS
5.0000 mg | ORAL_TABLET | Freq: Two times a day (BID) | ORAL | 5 refills | Status: DC
Start: 2021-12-28 — End: 2022-01-17

## 2021-12-28 MED ORDER — ARNUITY ELLIPTA 200 MCG/ACT IN AEPB: 1.0000 | INHALATION_SPRAY | Freq: Every day | RESPIRATORY_TRACT | 5 refills | Status: AC

## 2021-12-28 MED ORDER — ALBUTEROL SULFATE HFA 108 (90 BASE) MCG/ACT IN AERS
INHALATION_SPRAY | RESPIRATORY_TRACT | 1 refills | Status: DC
Start: 2021-12-28 — End: 2023-12-10

## 2021-12-28 NOTE — Progress Notes (Unsigned)
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 - I do not think that your shortness of breath is related to asthma at this point. - We are going to get you plugged back into Cardiology for evaluation of possible cardiac problems.  - Daily controller medication(s): Trelegy 200/62.5/25 one puff once daily in the morning AND Arnuity 2042m one puff at night AND Dupixent '300mg'$  every two weeks - Prior to physical activity: albuterol 2 puffs 10-15 minutes before physical activity. - Rescue medications: albuterol 4 puffs every 4-6 hours as needed - Asthma control goals:  * Full participation in all desired activities (may need albuterol before activity) * Albuterol use two time or less a week on average (not counting use with activity) * Cough interfering with sleep two time or less a month * Oral steroids no more than once a year * No hospitalizations  2. Allergic rhinitis (cat dander, tree pollen, weed pollen, grass, and dog) - INCREASE Xyzal '5mg'$  to twice daily. - Continue Pataday one drop per eye daily. - Consider allergy shots for long term control, but this would require weekly visits to the office for a period of time.   3. 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 - Continue to avoid all of these items above.  4. 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 - EpiPen refilled today.  5. Return in about 3 months (around 03/30/2022).     Subjective:   Claire [name] a [age].o.  female presenting today for follow up of  Chief Complaint  Patient presents with   Asthma    Little wheeze,cough,chest tighness, sob usually when she is doing housework. Has used her alb frequently    Allergic Rhinitis     Sneezing when someone is mowing the yard. Has had to take sudafed. Has been having migranes and sinus are draining    Rash    Had a few small rashes since her last visit     Claire [name] a history of the following: Patient Active Problem List   Diagnosis Date Noted   PTSD (post-traumatic stress disorder) 0279/89/2119 Eosinophilic esophagitis 0941/74/0814 H. 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  Claire [name] a [age].o. female presenting for a follow up visit.  She was last seen in July 2023.  At that time, lung function looked normal.  She was having a lot of shortness of breath which we were concerned was related to a cardiac issue.  The daughter comes back into cardiology.  We continue  Trelegy 200 mcg 1 puff daily as well as Dupixent 100 mg every 2 weeks.  For her allergic rhinitis, we continue with Xyzal as well as Pataday.  She continue to avoid wheat, rye, barley, and oat.  She avoid all stinging insects.  Since last visit, she has been stable. She saw Dr. Candee Furbish who did an echocardiogram and everything was normal.   Asthma/Respiratory Symptom History: She remains on the Trelegy. She is using her albuterol a lot especially when someone is mowing. There is a dog in the home who sheds a lot.  She is trying to get him as a Neurosurgeon.  Apparently he is not excited about being in the car because he vomits in it. Having the dog does not help at all. She is not sure how often she gets a new albuterol inhaler, but it is not too often. She is on the Dupixent twice monthly. But she does notice a different with it.   She had a chest CT performed at [facility]. It showed a 7 mm ill-defined pleural-based nodular density along the lateral left lung base.  Her last chest CT showed a nodule measuring 7 x 4 mm.  Therefore, it seems to be stable.  Allergic Rhinitis Symptom History: She did get the dog and this is one of her known triggers. She does not have HEPA filters in the home.  She has not had any antibiotics.  Food Allergy Symptom History: She continues to avoid wheat, rye, barley, and oat.  Her EpiPen is up-to-date.  She is continuing to avoid these.  There have been no accidental exposures.  She has not had any stinging insect encounters.  She tries to avoid them as much as she can.  Otherwise, there have been no changes to her past medical history, surgical history, family history, or social history.    Review of Systems  Constitutional: Negative.  Negative for chills, fever, malaise/fatigue and weight loss.  HENT: Negative.  Negative for congestion, ear discharge, ear pain, sinus pain and sore throat.   Eyes:  Negative for pain, discharge and redness.  Respiratory:  Positive for cough. Negative for sputum production, shortness of breath, wheezing and stridor.   Cardiovascular: Negative.  Negative for chest pain and palpitations.  Gastrointestinal:  Negative for abdominal pain, diarrhea, heartburn, nausea and vomiting.  Skin: Negative.  Negative for itching and rash.  Neurological:  Negative for dizziness and headaches.  Endo/Heme/Allergies:  Negative for environmental allergies. Does not bruise/bleed easily.       Objective:   Blood pressure 118/88, pulse 97, temperature  98.4 F (36.9 C), resp. rate 18, SpO2 96 %. There is no height or weight on file to calculate BMI.    Physical Exam Constitutional:      Appearance: She is well-developed and underweight.  HENT:     Head: Normocephalic and atraumatic.     Right Ear: Tympanic membrane, ear canal and external ear normal.     Left Ear: Tympanic membrane, ear canal and external ear normal.     Nose: No nasal deformity, septal deviation, mucosal edema or rhinorrhea.     Right Turbinates: Enlarged, swollen and pale.     Left Turbinates: Enlarged, swollen and pale.     Right Sinus: No maxillary sinus tenderness or frontal sinus tenderness.     Left Sinus: No maxillary sinus tenderness or frontal sinus tenderness.     Comments: No nasal polyps noted.     Mouth/Throat:  Mouth: Mucous membranes are not pale and not dry.     Pharynx: Uvula midline.  Eyes:     General: Lids are normal. No allergic shiner.       Right eye: No discharge.        Left eye: No discharge.     Conjunctiva/sclera: Conjunctivae normal.     Right eye: Right conjunctiva is not injected. No chemosis.    Left eye: Left conjunctiva is not injected. No chemosis.    Pupils: Pupils are equal, round, and reactive to light.  Cardiovascular:     Rate and Rhythm: Normal rate and regular rhythm.     Heart sounds: Normal heart sounds.  Pulmonary:     Effort: Pulmonary effort is normal. No tachypnea, accessory muscle usage, prolonged expiration or respiratory distress.     Breath sounds: Normal breath sounds. No wheezing, rhonchi or rales.     Comments: Moving air well in all lung fields. No increased work of breathing noted.  Chest:     Chest wall: No tenderness.  Lymphadenopathy:     Cervical: No cervical adenopathy.  Skin:    General: Skin is warm.     Capillary Refill: Capillary refill takes less than 2 seconds.     Coloration: Skin is not pale.     Findings: No abrasion, erythema, petechiae or rash. Rash is not papular, urticarial  or vesicular.  Neurological:     Mental Status: She is alert.  Psychiatric:        Behavior: Behavior is cooperative.      Diagnostic studies:    Spirometry: results normal (FEV1: 3.10/92%, FVC: 3.10/92%, FEV1/FVC: 83%).    Spirometry consistent with normal pattern.   Allergy Studies: none       Salvatore Marvel, MD  [facility]

## 2021-12-28 NOTE — Patient Instructions (Addendum)
Severe persistent asthma - Lung testing looked perfect today. - I do not think that your shortness of breath is related to asthma at this point. - We are going to get you plugged back into Cardiology for evaluation of possible cardiac problems.  - Daily controller medication(s): Trelegy 200/62.5/25 one puff once daily in the morning AND Arnuity 285mg one puff at night AND Dupixent '300mg'$  every two weeks - Prior to physical activity: albuterol 2 puffs 10-15 minutes before physical activity. - Rescue medications: albuterol 4 puffs every 4-6 hours as needed - Asthma control goals:  * Full participation in all desired activities (may need albuterol before activity) * Albuterol use two time or less a week on average (not counting use with activity) * Cough interfering with sleep two time or less a month * Oral steroids no more than once a year * No hospitalizations  2. Allergic rhinitis (cat dander, tree pollen, weed pollen, grass, and dog) - INCREASE Xyzal '5mg'$  to twice daily. - Continue Pataday one drop per eye daily. - Consider allergy shots for long term control, but this would require weekly visits to the office for a period of time.   3. 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 - Continue to avoid all of these items above.  4. 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 - EpiPen refilled today.  5. Return in about 3 months (around 03/30/2022).    Please inform [place] any Emergency Department visits, hospitalizations, or changes in symptoms. Call [place]efore going to the ED for breathing or allergy symptoms since we might be able to fit you in for a sick visit. Feel free to contact u[place]anytime with any questions, problems, or concerns.  It was a pleasure to see you again today!  Websites that have reliable patient information: 1. American Academy of Asthma, Allergy, and Immunology: www.aaaai.org 2. Food  Allergy Research and Education (FARE): foodallergy.org 3. Mothers of Asthmatics: http://www.asthmacommunitynetwork.org 4. American College of Allergy, Asthma, and Immunology: www.acaai.org   COVID-19 Vaccine Information can be found at: [email or web address] questions related to vaccine distribution or appointments, please email [email or web address] or call [phone]   We realize that you might be concerned about having an allergic reaction to the COVID19 vaccines. To help with that concern, WE ARE OFFERING THE COVID19 VACCINES IN OUR OFFICE! Ask the front desk for dates!    "Like" u[place]on Facebook and Instagram for our latest updates!      A healthy democracy works best when ANew York Life Insuranceparticipate! Make sure you are registered to vote! If you have moved or changed any of your contact information, you will need to get this updated before voting!  In some cases, you MAY be able to register to vote online: [email or web address]

## 2021-12-31 ENCOUNTER — Encounter: Payer: Self-pay | Admitting: Allergy & Immunology

## 2022-01-02 ENCOUNTER — Encounter: Payer: Self-pay | Admitting: Gastroenterology

## 2022-01-02 ENCOUNTER — Ambulatory Visit (INDEPENDENT_AMBULATORY_CARE_PROVIDER_SITE_OTHER): Payer: Medicare Other | Admitting: Gastroenterology

## 2022-01-02 VITALS — BP 148/93 | HR 90 | Temp 98.1°F | Ht 64.0 in | Wt 142.6 lb

## 2022-01-02 DIAGNOSIS — K219 Gastro-esophageal reflux disease without esophagitis: Secondary | ICD-10-CM | POA: Diagnosis not present

## 2022-01-02 DIAGNOSIS — A048 Other specified bacterial intestinal infections: Secondary | ICD-10-CM

## 2022-01-02 DIAGNOSIS — K5900 Constipation, unspecified: Secondary | ICD-10-CM

## 2022-01-02 MED ORDER — LINACLOTIDE 290 MCG PO CAPS: ORAL_CAPSULE | ORAL | 3 refills | Status: DC

## 2022-01-02 NOTE — Progress Notes (Signed)
GI Office Note    Referring Provider: [facility], [place] Physician:  [facility], [place]  Primary Gastroenterologist: Garfield Cornea, MD   Chief Complaint   Chief Complaint  Patient presents with   Constipation    Here for refills on Linzess, hasn't had a bm in a week.    History of Present Illness   Claire [name] is a 51 y.o. female presenting today for follow-up of GERD and constipation.  Last seen March 2021.  She was doing well on Linzess until she ran out about a week ago.  I have attempted a bowel movement every couple of days.  Stools soft.  No melena or rectal bleeding.  Since running out of Linzess it has been a week since she had a BM.  She has also been off of omeprazole for about a month.  States she tried to get a refill at the pharmacy but they did not have any refills on file.  Denies any recurrent heartburn at this point but typically has issues when coming off medication. No dysphagia. Previously had H. pylori but we were unable to check for eradication because she cannot tolerate coming off PPI for the test.  She is interested in pursuing at this time.  She tells me she recently had a colonoscopy at [facility], was normal.  Denies having any polyps.  According to op note, her preparation was poor and much time was used to irrigate the walls.  Recommended repeat colonoscopy in 5 years.   EGD on file August 2018 with gray paper esophageal mucosa in longitudinal for is without tumor or Barrett's esophagus, antral erosions but no ulcers, 2 superficial tears in the midesophagus corresponding to an area of critical narrowing.  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  Stomach biopsies positive for H. pylori treated with Pylera and esophageal biopsy with squamous mucosa with marked increased epithelial eosinophils.  She was unable to complete H. pylori breath testing for eradication due to inability to stop her PPI at that time.   In October 2021  she had allergy testing with high IgE to cat dander, tree pollen, weed pollen, moderate IgE to grass pollen, low IgE to dog dander.  Very positive IgE levels to wheat, barley, rye, oat recommended to follow gluten free diet.  September 2022, alpha gal testing negative.  Stinging insect panel positive for the entire panel with highest allergy to paper wasp.  04/30/2021 10:27 AM EST   _______________________________________________________________________________ Patient Name: Claire [name]             Procedure Date: 04/30/2021 9:04 AM MRN: [id number]                     Date of Birth: [birth year] Admit Type: Outpatient                Age: 76 Room: ENDO [facility]                    Gender: Female Note Status: Finalized                Instrument Name: [id number] _______________________________________________________________________________  Procedure:             Colonoscopy Indications:           Screening for colorectal malignant neoplasm Providers:             MEGAN Rob Hickman, MD, REBECCA Gomez [place], [facility]  D. REYNOLDS, JESSICA DMercie Eon, CRNA Referring MD:          Darnelle Spangle, FNP (Referring MD) Medicines:             Propofol per Anesthesia Complications:         No immediate complications. _______________________________________________________________________________ Procedure:             Pre-Anesthesia Assessment:                       - ASA Grade Assessment: III - A patient with severe                        systemic disease.                       After obtaining informed consent, the scope was passed                        under direct vision. Throughout the procedure, the                        patient's blood pressure, pulse, and oxygen                        saturations were monitored continuously. The                        Colonoscope was introduced through the anus and                        advanced to the the cecum, identified by  appendiceal                        orifice and ileocecal valve. The colonoscopy was                        performed without difficulty. The patient tolerated                        the procedure well. The quality of the bowel                        preparation was poor. Scope withdrawal time was 23                        minutes.                                                                                Findings:     A digital rectal exam was performed. It was normal.     The exam was otherwise without abnormality.     There was a lot of retained stool but time was spent to irrigate the      walls  Impression:            - Preparation of the colon was poor.                       A digital rectal exam was performed                       - The examination was otherwise normal.                       - No specimens collected. Recommendation:        - Patient has a contact number available for                        emergencies. The signs and symptoms of potential                        delayed complications were discussed with the patient.                        Return to normal activities tomorrow. Written                        discharge instructions were provided to the patient.                       - Resume previous diet today.                       - Repeat colonoscopy in 5 years for screening purposes.    Medications   Current Outpatient Medications  Medication Sig Dispense Refill   albuterol (PROVENTIL) (2.5 MG/3ML) 0.083% nebulizer solution Take 3 mLs (2.5 mg total) by nebulization every 4 (four) hours as needed for wheezing or shortness of breath. 150 mL 1   albuterol (VENTOLIN HFA) 108 (90 Base) MCG/ACT inhaler Inhale two puffs every 4-6 hours if needed for cough or wheeze. 18 g 1   chlorthalidone (HYGROTON) 25 MG tablet Take 25 mg by mouth every morning.     diphenhydrAMINE (BENADRYL) 25 MG  tablet Take 50 mg by mouth as needed.     DUPIXENT 300 MG/2ML prefilled syringe Inject into the skin.     escitalopram (LEXAPRO) 20 MG tablet Take 1 tablet (20 mg total) by mouth daily. 90 tablet 0   Fluticasone Furoate (ARNUITY ELLIPTA) 200 MCG/ACT AEPB Inhale 1 puff into the lungs at bedtime. 30 each 5   hydrOXYzine (ATARAX) 50 MG tablet Take 50 mg by mouth at bedtime as needed.     ibuprofen (ADVIL) 800 MG tablet Take 800 mg by mouth every 8 (eight) hours as needed.     levETIRAcetam (KEPPRA) 500 MG tablet TAKE 1 TABLET BY MOUTH TWICE DAILY **MUST  COME  TO  APPOINTMENT** 180 tablet 2   levocetirizine (XYZAL) 5 MG tablet Take 1 tablet (5 mg total) by mouth in the morning and at bedtime. 60 tablet 5   LINZESS 290 MCG CAPS capsule TAKE 1 CAPSULE BY MOUTH ONCE DAILY 30  MINUTES  BEFORE  BREAKFAST 90 capsule 0   methocarbamol (ROBAXIN) 500 MG tablet Take 500 mg by mouth 3 (three) times daily as needed.     metoprolol succinate (TOPROL-XL) 25 MG 24 hr tablet Take 1 tablet (25 mg total) by mouth daily. Lane  tablet 3   mirtazapine (REMERON) 45 MG tablet Take 1 tablet (45 mg total) by mouth at bedtime. 90 tablet 0   montelukast (SINGULAIR) 10 MG tablet TAKE 1 TABLET BY MOUTH AT BEDTIME 30 tablet 5   Olopatadine HCl (PATADAY) 0.2 % SOLN Place 1 drop each eye once a day as needed for itchy watery eyes 2.5 mL 5   omeprazole (PRILOSEC) 40 MG capsule TAKE 1 CAPSULE BY MOUTH ONCE DAILY BEFORE BREAKFAST 30 capsule 5   oxyCODONE-acetaminophen (PERCOCET) 10-325 MG tablet Take 1 tablet by mouth every 6 (six) hours as needed.     SAVELLA 50 MG TABS tablet Take 50 mg by mouth 2 (two) times daily.     telmisartan (MICARDIS) 40 MG tablet Take 40 mg by mouth daily.     TRELEGY ELLIPTA 200-62.5-25 MCG/ACT AEPB Inhale 1 puff into the lungs daily. 28 each 5   triamcinolone ointment (KENALOG) 0.1 % Apply 1 application topically 2 (two) times daily. 30 g 5   No current facility-administered medications for this visit.     Allergies   Allergies as of 01/02/2022 - Review Complete 01/02/2022  Allergen Reaction Noted   Baclofen Shortness Of Breath 10/10/2016   Duloxetine Hives 07/29/2012   Penicillins Hives 07/29/2012   Sulfa antibiotics Hives, Nausea And Vomiting, and Other (See Comments) 07/29/2012   Benadryl [diphenhydramine]  09/26/2021   Gluten meal  06/07/2019   Penicillin v potassium Other (See Comments) 05/01/2021   Claritin [loratadine] Other (See Comments) 07/28/2014   Duloxetine hcl Hives and Other (See Comments) 07/29/2012   Ultram [tramadol] Other (See Comments) 09/12/2014          Review of Systems   General: Negative for anorexia, weight loss, fever, chills, fatigue, weakness. ENT: Negative for hoarseness, difficulty swallowing , nasal congestion. CV: Negative for chest pain, angina, palpitations, dyspnea on exertion, peripheral edema.  Respiratory: Negative for dyspnea at rest, dyspnea on exertion, cough, sputum, wheezing.  GI: See history of present illness. GU:  Negative for dysuria, hematuria, urinary incontinence, urinary frequency, nocturnal urination.  Endo: Negative for unusual weight change.     Physical Exam   BP (!) 148/93 (BP Location: Right Arm, Patient Position: Sitting, Cuff Size: Normal)   Pulse 90   Temp 98.1 F (36.7 C) (Oral)   Ht '5\' 4"'$  (1.626 m)   Wt 142 lb 9.6 oz (64.7 kg)   SpO2 98%   BMI 24.48 kg/m    General: Well-nourished, well-developed in no acute distress.  Eyes: No icterus. Mouth: Oropharyngeal mucosa moist and pink , no lesions erythema or exudate. Lungs: Clear to auscultation bilaterally.  Heart: Regular rate and rhythm, no murmurs rubs or gallops.  Abdomen: Bowel sounds are normal, nontender, nondistended, no hepatosplenomegaly or masses,  no abdominal bruits or hernia , no rebound or guarding.  Rectal: not performed Extremities: No lower extremity edema. No clubbing or deformities. Neuro: Alert and oriented x 4   Skin: Warm and dry, no  jaundice.   Psych: Alert and cooperative, normal mood and affect.  Labs   Labs from June 2023: Lipase 61, Strole blood cell count 7700, hemoglobin 13, platelets 391,000, total bilirubin 0.3, alkaline phosphatase 81, AST 17, ALT 24, creatinine 0.84.  Imaging Studies   No results found.  Assessment   GERD: chronic gerd, recently out of medication. She has RX on file at the pharmacy for omeprazole '40mg'$  daily. Before restarting her PPI, she will complete H.pylori stool antigen to confirm eradication.   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 No dysphagia. Has since had allergy testing with numerous allergens as outlined.   Constipation: did well on Linzess  but out for one week. Will restart today.  Colonoscopy cancer screening: she recently had this done by Dr. Adelina Mings. Prep was poor. Advised follow up colonoscopy in five years.    PLAN   H.pylori stool antigen. After stool collection resume omeprazole '40mg'$  daily. She will call with updated medication list.  Resume linzess 270mg daily. Return ov in one year.    LLaureen Ochs LBobby Rump[facility]

## 2022-01-02 NOTE — Patient Instructions (Signed)
Please collect stool specimen before starting your omeprazole for acid reflux. You will need to go by [facility] at [facility] in [place] for a specimen container today. Once you collect your specimen, you can then start omeprazole. Start back on Linzess 259mg daily. Samples and RX provided.  Return to the office in one year or sooner if needed.

## 2022-01-05 LAB — H. PYLORI ANTIGEN, STOOL: H pylori Ag, Stl: NEGATIVE

## 2022-01-07 ENCOUNTER — Encounter: Payer: Self-pay | Admitting: *Deleted

## 2022-01-08 ENCOUNTER — Other Ambulatory Visit (HOSPITAL_COMMUNITY): Payer: Self-pay

## 2022-01-08 ENCOUNTER — Ambulatory Visit (INDEPENDENT_AMBULATORY_CARE_PROVIDER_SITE_OTHER): Payer: Medicare Other | Admitting: Neurology

## 2022-01-08 ENCOUNTER — Telehealth: Payer: Self-pay | Admitting: Neurology

## 2022-01-08 ENCOUNTER — Telehealth: Payer: Self-pay | Admitting: *Deleted

## 2022-01-08 ENCOUNTER — Encounter: Payer: Self-pay | Admitting: Neurology

## 2022-01-08 VITALS — BP 133/91 | HR 76 | Ht 64.0 in | Wt 141.8 lb

## 2022-01-08 DIAGNOSIS — G43719 Chronic migraine without aura, intractable, without status migrainosus: Secondary | ICD-10-CM | POA: Diagnosis not present

## 2022-01-08 MED ORDER — BOTOX 200 UNITS IJ SOLR
INTRAMUSCULAR | 1 refills | Status: DC
  Filled 2022-01-08: qty 1, fill #0
  Filled 2022-01-11: qty 1, 84d supply, fill #0
  Filled 2022-04-02 – 2022-04-04 (×2): qty 1, 84d supply, fill #1

## 2022-01-08 NOTE — Addendum Note (Signed)
Addended by: Gildardo Griffes on: 01/08/2022 09:01 AM   Modules accepted: Orders

## 2022-01-08 NOTE — Progress Notes (Signed)
Subjective:    Patient ID: Claire [name] is a 51 y.o. female.  HPI    Star Age, MD, PhD [facility] [address] [place], [place] [address]  Dear Claire [name],  I saw your patient, Claire [name], upon your kind request in the neurologic clinic today for initial consultation of her migraine headaches.  The patient is unaccompanied today.  As you know, [name] is a 51 year old right-handed woman with an underlying medical history of hypertension, migraine headaches, fibromyalgia, history of seizure, carpal tunnel syndrome, anxiety, depression, vertigo, arthritis, asthma, allergies, NASH, hypokalemia, status post back surgery, left ureter surgery, right shoulder surgery, carpal tunnel surgery, and gallbladder surgery, who reports a longstanding history of migraine headaches.  She reports that she has had migraines since her early 73s.  Her sister has migraines.  I reviewed your office note from 10/22/2021.   She had seen Dr. Jannifer [name] and nurse practitioner Claire [name] for seizure-like episodes.  She has not been seen in this clinic for over 1 year.  She was last seen by Claire Denmark, NP on 08/24/2020 and I reviewed the note, I copied the note below for reference.  She was last seen by Dr. Margette [name] on 08/24/2019 and I reviewed the note.  I copied the note below for reference.  She reports a history of seizures, is not clear on what type of seizures she has.  She has not had any seizure-like events in about 2 years, she believes that the [place] has been helpful.  She is currently on multiple medications including centrally acting medications. However, she does not take all the medications that are listed on your list, she is not sure about the exact list of her medications, she is also not sure as to what medications she has tried for migraines.  She has been prescribed Maxalt but reports that it did not help.  She is no longer on Fioricet, she is currently on  levetiracetam 500 mg twice daily, no longer on Norco 10 mg strength, she takes oxycodone 5 mg strength 3 times a day as needed, she is still on hydroxyzine as I understand, she is no longer on metaxalone, she is on Savella, she is taking Robaxin as well.  She takes Lamictal, she believes it is 100 mg once daily.  She reports that her sister takes Topamax.  She also is not sure if she herself tolerated normal.  She has never been on Botox injections.  She would be willing to consider Botox injections and we talked about the injections, potential side effects and she was also given additional patient information.  She is willing to proceed with the authorization for Botox injections for migraines.  Her migraines are often generalized, she has associated light sensitivity, sometimes she gets blurry vision with them.  She had an eye examination last year, none this year, she is encouraged to make an appointment for this year.  She has prescription bifocal eyeglasses.  She denies any sudden onset of one-sided weakness or numbness or tingling.  She had a brain MRI in 2014 which was benign.  She would be willing to proceed with a brain MRI.  She does not smoke, she does not drink any alcohol, she does not drink caffeine daily.  She hydrates with water but drinks about 70 ounces of water per day, she has a history of low potassium.  She had blood work through your office on 10/22/2021 and I reviewed the results:  BUN was 17, creatinine 0.88, potassium borderline elevated at 6.1, calcium slightly elevated at 10.9, alk phos 92, AST 13, ALT 13.  CBC with differential and platelets showed borderline elevated hemoglobin at 15.8, hematocrit borderline elevated at 45.9.  TSH normal at 0.53.  Lipid panel benign.  She has over 15 headache days per month, migraine attacks are about 4-5 times a week.  I had evaluated her about 2-1/2 years ago for sleep apnea concern.  A sleep study in April 2021 did not show any significant  obstructive sleep apnea.   Previously:  08/24/2020 (SS): <<Claire [name] is a 51 year old female with history of fibromyalgia and seizure-like episodes.  Is on Keppra.  Seizure-like events are described as confusion, amnesia for 30 to 60 minutes.  No further spells in the last year.  Denies any side effects of Keppra.  Sees pain management, for fibro and chronic low back pain.  Uses a cane to ambulate.  Lives alone, drives a car, lives in [place].  Had left rotator cuff surgery in April, remains in PT.  Has had several GI illnesses this year, has lost 20 pounds.  Here today for evaluation unaccompanied.>>  08/24/2019 (Dr. Jannifer [name]): <<Claire [name] is a 51 year old left-handed [place] female with a history of fibromyalgia and seizure-like episodes in the past.  The patient was last seen through this office in July 2018 for this reason.  The patient was on Keppra and had a good response to the medication, she had not had any events while on the medication.  It appears that she stopped the medication when she had no insurance and could not afford the drug and then began having seizure type events again.  Last such event was 2 weeks ago.  The patient will have events where she becomes confused, she has amnesia for 30 to 60 minutes.  She does not have generalized jerking or tongue biting or bowel and bladder incontinence.  The patient is still operating a motor vehicle apparently.  She is followed through a pain center for her fibromyalgia.  She is having troubles with gait instability, she has chronic low back pain, she uses a cane to get around and she will occasionally fall.  She does not drink alcohol.  She comes to this office for an evaluation.>> 07/13/19 (SA): Claire [name] is a 51 year old left-handed woman with an underlying medical history of asthma, hypertension, migraine, fibromyalgia and blackout spells, suspected seizures on Keppra (for which she has seen Dr. Jannifer [name]), allergies, acid reflux, degenerative disc disease,  depression, anxiety, history of syncope, PTSD by chart review, palpitations, vertigo, and overweight state, who Presents for follow-up consultation of her sleep disturbance, after interim sleep testing.  The patient is unaccompanied today.  I first met her on 06/09/2019 at the request of her allergy and asthma specialist, at which time the patient reported snoring and excessive daytime somnolence.  She had trouble going to sleep and staying asleep.  She was advised to proceed with a sleep study.  She had a baseline sleep study on 06/24/2019, which showed a sleep efficiency of 94.6%, sleep was primarily light stage sleep with over 90% of stage II sleep and absence of REM sleep.  Her total AHI was 1.4/h, O2 nadir was 84%, average oxygen saturation was 90%.  She had no significant PLM's.  Mild and intermittent snoring was noted. She was advised regarding her test results by phone call.  She requested a follow-up appointment to discuss findings in more detail.   She reports  no new symptoms, as far as her sleep is concerned.  She reports that at the time of her sleep study she fell asleep much quicker than usual at home.  She did not feel fully rested, she did not think she slept nearly as much as the sleep study indicated.  We talked about her sleep study results in detail.  She feels that the BuSpar is not as effective for her anxiety.  She has an appointment with her psychiatrist/behavioral health coming up.  She has also made a follow-up appointment with Dr. Jannifer [name].     06/09/19 (SA): (She) reports snoring and excessive daytime somnolence.  I reviewed the office note from 05/13/2019.  Her Epworth sleepiness score is 0/24, Fatigue severity score is 62 out of 63.  She reports trouble going to sleep and staying asleep.  She has tried melatonin over-the-counter which did not help.  She does snore.  She is single and lives alone but has been told by a cousin that she had pauses in her breathing when she was witnessed to  sleep on the couch one time.  The patient denies any recurrent morning headaches but does have recurrent headaches she reports.  She has not made a follow-up appointment with Dr. Jannifer [name] but would like to make one on her way out today.  She reports increase in stress.  She reports not being able to stay asleep.  She has no obvious family history of sleep apnea as she recalls.  She does not have a TV in her bedroom but sometimes does look at her phone at night.  She has a bedtime between 11 and midnight and rise time generally between 6 and 7.  She is a non-smoker and does not drink alcohol.  She drinks very little caffeine, approximately 1 soda per week.    Her Past Medical History Is Significant For: Past Medical History:  Diagnosis Date   Acid reflux    Allergy    pollen, dust   Anxiety    Arthritis    Asthma    Carpal tunnel syndrome    bilateral   DDD (degenerative disc disease), cervical    Depression    Fibromyalgia    Hypertension    Hypokalemia    Migraine    Palpitations    Panic attacks    PTSD (post-traumatic stress disorder)    Seizures ([place])    unknown etiology; last seizure was 2 years ago; on meds.   Syncope and collapse    Tennis elbow    Ulcer    Vertigo     Her Past Surgical History Is Significant For: Past Surgical History:  Procedure Laterality Date   [place]   BIOPSY  10/21/2016   Procedure: BIOPSY;  Surgeon: Daneil Dolin, MD;  [facility]: [facility];  Service: Endoscopy;;  gastric, esophagus   CARPAL TUNNEL RELEASE Left    2019   CHOLECYSTECTOMY     ESOPHAGOGASTRODUODENOSCOPY (EGD) WITH PROPOFOL N/A 10/21/2016   Procedure: ESOPHAGOGASTRODUODENOSCOPY (EGD) WITH PROPOFOL;  Surgeon: Daneil Dolin, MD;  Location: [facility];  Service: Endoscopy;  Laterality: N/A;  9:30am   GALLBLADDER SURGERY     SHOULDER SURGERY Right    SPINE SURGERY  1993   lumbar disc surgery    Her Family History Is Significant For: Family History  Problem  Relation Age of Onset   COPD Mother    Bronchitis Mother    Arthritis Mother    Depression Mother  Heart disease Mother    Hypertension Mother    Alcohol abuse Father    Early death Father        GSW   Migraines Sister    Colon cancer Maternal Grandmother    PKU Son    Sleep apnea Neg Hx     Her Social History Is Significant For: Social History   Socioeconomic History   Marital status: Single    Spouse name: Not on file   Number of children: 1   Years of education: HS   Highest education level: Not on file  Occupational History   Occupation: disability    Comment: unemployed  Tobacco Use   Smoking status: Never   Smokeless tobacco: Never  Vaping Use   Vaping Use: Never used  Substance and Sexual Activity   Alcohol use: Not Currently    Comment: drink occasionally   Drug use: No   Sexual activity: Not Currently    Partners: Female    Birth control/protection: None  Other Topics Concern   Not on file  Social History Narrative   Patient is left handed.   Patient drinks very little caffeine.   Lives alone   Social Determinants of Health   Financial Resource Strain: Not on file  Food Insecurity: Not on file  Transportation Needs: Not on file  Physical Activity: Not on file  Stress: Not on file  Social Connections: Not on file    Her Allergies Are:  Allergies  Allergen Reactions   Baclofen Shortness Of Breath    Swollen ankles   Duloxetine Hives    Other reaction(s): Other (See Comments) "Hives, forgot who I was"   Penicillins Hives    Has patient had a PCN reaction causing immediate rash, facial/tongue/throat swelling, SOB or lightheadedness with hypotension: Unknown Has patient had a PCN reaction causing severe rash involving mucus membranes or skin necrosis: Yes Has patient had a PCN reaction that required hospitalization: Unknown Has patient had a PCN reaction occurring within the last 10 years: Unknown If all of the above answers are "NO", then  may proceed with Cephalosporin use. Other reaction(s): Other (See Comments) Has patient had a PCN reaction causing immediate rash, facial/tongue/throat swelling, SOB or lightheadedness with hypotension: Unknown Has patient had a PCN reaction causing severe rash involving mucus membranes or skin necrosis: Yes Has patient had a PCN reaction that required hospitalization: Unknown Has patient had a PCN reaction occurring within the last 10 years: Unknown If all of the above answers are "NO", then may proceed with Cephalosporin use.   Sulfa Antibiotics Hives, Nausea And Vomiting and Other (See Comments)   Benadryl [Diphenhydramine]     Started throwing up   Gluten Meal    Penicillin V Potassium Other (See Comments)   Claritin [Loratadine] Other (See Comments)    shaking   Duloxetine Hcl Hives and Other (See Comments)    "Hives, forgot who I was"   Ultram [Tramadol] Other (See Comments)    Dizzy  :   Her Current Medications Are:  Outpatient Encounter Medications as of 01/08/2022  Medication Sig   albuterol (PROVENTIL) (2.5 MG/3ML) 0.083% nebulizer solution Take 3 mLs (2.5 mg total) by nebulization every 4 (four) hours as needed for wheezing or shortness of breath.   albuterol (VENTOLIN HFA) 108 (90 Base) MCG/ACT inhaler Inhale two puffs every 4-6 hours if needed for cough or wheeze.   budesonide-formoterol (SYMBICORT) 160-4.5 MCG/ACT inhaler Inhale 2 puffs into the lungs daily.   butalbital-apap-caffeine-codeine (FIORICET  WITH CODEINE) 50-325-40-30 MG capsule Take 1 capsule by mouth every 4 (four) hours as needed for headache.   chlorthalidone (HYGROTON) 25 MG tablet Take 25 mg by mouth every morning.   diphenhydrAMINE (BENADRYL) 25 MG tablet Take 50 mg by mouth as needed.   DUPIXENT 300 MG/2ML prefilled syringe Inject into the skin.   escitalopram (LEXAPRO) 20 MG tablet Take 1 tablet (20 mg total) by mouth daily.   famotidine (PEPCID) 20 MG tablet Take 20 mg by mouth at bedtime.    Fluticasone Furoate (ARNUITY ELLIPTA) 200 MCG/ACT AEPB Inhale 1 puff into the lungs at bedtime.   hydrOXYzine (ATARAX) 50 MG tablet Take 50 mg by mouth at bedtime as needed.   ibuprofen (ADVIL) 400 MG tablet Take 400 mg by mouth every 8 (eight) hours as needed.   lamoTRIgine (LAMICTAL) 100 MG tablet Take 100 mg by mouth 2 (two) times daily.   levETIRAcetam (KEPPRA) 500 MG tablet TAKE 1 TABLET BY MOUTH TWICE DAILY **MUST  COME  TO  APPOINTMENT**   levocetirizine (XYZAL) 5 MG tablet Take 1 tablet (5 mg total) by mouth in the morning and at bedtime.   linaclotide (LINZESS) 290 MCG CAPS capsule TAKE 1 CAPSULE BY MOUTH ONCE DAILY 30  MINUTES  BEFORE  BREAKFAST   metaxalone (SKELAXIN) 800 MG tablet Take 800 mg by mouth 3 (three) times daily.   methocarbamol (ROBAXIN) 500 MG tablet Take 500 mg by mouth 3 (three) times daily as needed.   metoprolol succinate (TOPROL-XL) 25 MG 24 hr tablet Take 1 tablet (25 mg total) by mouth daily.   mirtazapine (REMERON) 45 MG tablet Take 1 tablet (45 mg total) by mouth at bedtime.   montelukast (SINGULAIR) 10 MG tablet TAKE 1 TABLET BY MOUTH AT BEDTIME   Olopatadine HCl (PATADAY) 0.2 % SOLN Place 1 drop each eye once a day as needed for itchy watery eyes   omeprazole (PRILOSEC) 40 MG capsule TAKE 1 CAPSULE BY MOUTH ONCE DAILY BEFORE BREAKFAST   ondansetron (ZOFRAN) 8 MG tablet 1 tablet as needed for nausea Orally every 8 hours as needed for 14 days   oxyCODONE-acetaminophen (PERCOCET) 10-325 MG tablet Take 1 tablet by mouth every 6 (six) hours as needed.   potassium chloride 20 MEQ/15ML (10%) SOLN SMARTSIG:15 By Mouth 1-2 Times Daily   rizatriptan (MAXALT) 10 MG tablet 1 tablet as needed for migraine. May repeat after 2 hours. Maximum of 20 mg per 24 hours. Orally Once a day for 7 day(s)   SAVELLA 50 MG TABS tablet Take 50 mg by mouth 2 (two) times daily.   telmisartan (MICARDIS) 40 MG tablet Take 40 mg by mouth daily.   TRELEGY ELLIPTA 200-62.5-25 MCG/ACT AEPB Inhale  1 puff into the lungs daily.   triamcinolone ointment (KENALOG) 0.1 % Apply 1 application topically 2 (two) times daily.   No facility-administered encounter medications on file as of 01/08/2022.  :   Review of Systems:  Out of a complete 14 point review of systems, all are reviewed and negative with the exception of these symptoms as listed below:  Review of Systems  Neurological:        Pt here for migraines Pt states daily headaches Pt states did sleep study 2021 no CPAP machine Pt states may need another sleep study. Pt states fatigue,hypertension.   ESS:2 FSS:61     Objective:  Neurological Exam  Physical Exam Physical Examination:   Vitals:   01/08/22 0801  BP: (!) 133/91  Pulse: 76  General Examination: The patient is a very pleasant 51 y.o. female in no acute distress. She appears well-developed and well-nourished and well groomed.   HEENT: Normocephalic, atraumatic, pupils are equal, round and reactive to light, funduscopic exam benign, corrective eyeglasses in place.  Extraocular tracking is good without limitation to gaze excursion or nystagmus noted. Hearing is grossly intact. Face is symmetric with normal facial animation. Speech is clear with no dysarthria noted. There is no hypophonia. There is no lip, neck/head, jaw or voice tremor. Neck is supple with full range of passive and active motion. There are no carotid bruits on auscultation. Oropharynx exam reveals: mild mouth dryness, adequate dental hygiene. Tongue protrudes centrally and palate elevates symmetrically.   Chest: Clear to auscultation without wheezing, rhonchi or crackles noted.  Heart: S1+S2+0, regular and normal without murmurs, rubs or gallops noted.   Abdomen: Soft, non-tender and non-distended.  Extremities: There is no pitting edema in the distal lower extremities bilaterally.   Skin: Warm and dry without trophic changes noted.   Musculoskeletal: exam reveals no obvious joint deformities,  but reports pain.   Neurologically:  Mental status: The patient is awake, alert and oriented in all 4 spheres. Her immediate and remote memory, attention, language skills and fund of knowledge are appropriate. There is no evidence of aphasia, agnosia, apraxia or anomia. Speech is clear with normal prosody and enunciation. Thought process is linear.  Mood is constricted, affect appears flat.   Cranial nerves II - XII are as described above under HEENT exam.  Motor exam: Normal bulk, strength and tone is noted. There is no obvious action or resting tremor.  Fine motor skills and coordination: grossly intact.  Reflexes are 2+ throughout. Cerebellar testing: No dysmetria or intention tremor. There is no truncal or gait ataxia.  Sensory exam: intact to light touch in the upper and lower extremities.  Gait, station and balance: She stands slowly and does not feel comfortable standing or walking without her single-point cane.  She walks slowly with her cane.  She walks slightly wide-based.   Assessment and Plan:   In summary, DELAYLA HOFFMASTER is a very pleasant 51 y.o.-year old female with an underlying medical history of hypertension, migraine headaches, fibromyalgia, history of seizure, carpal tunnel syndrome, anxiety, depression, vertigo, arthritis, asthma, allergies, NASH, hypokalemia, status post back surgery, left ureter surgery, right shoulder surgery, carpal tunnel surgery, and gallbladder surgery, who presents for evaluation of her migraine headaches of several years duration.  She has tried and failed multiple medications, currently on antidepressant medication, Lamictal has not helped her headaches, Ocie Cornfield is not helping and she is in the process of tapering off of it as I understand.  She is on narcotic pain medication as well.  She tried Fioricet and Maxalt recently as I understand.  Instead of adding another oral or systemic medication I would like to pursue Botox injections.  She is agreeable to  pursuing this.  We talked about expectations, potential side effects and limitations of Botox injections.  She is advised that we will consent her at the first injection visit.  We will proceed with insurance authorization.  I would not add another antiepileptic medication to her regimen.  There is Topamax for migraine prevention as she already is on Keppra and she is on Lamictal.  Her exam is nonfocal neurologically.  She is advised to make an appointment for her eye exam for this year.  She is advised to proceed with a brain MRI with and without  contrast, she is encouraged to hydrate well but not go over 64 ounces per day as she has a history of eating continuously denies any understand.  She is not sure as to what medications she was trying on for migraine headaches through your office.  She is advised to get in touch with your office and get a more comprehensive list of medications she has tried and failed for migraine management.  We may have to submit a list of medications to her insurance for the Botox approval.  We will continue her next visit she had a after her Botox authorization to sleep.  I answered any questions today and she was in agreement to continue we will keep her posted as to her brain MRI results by phone call or manager messaging.  Thank you very much for allowing me to participate in the care of this nice patient. If I can be of any further assistance to you please do not hesitate to call me at [phone].  Sincerely,   Star Age, MD, PhD  I spent 42 minutes in total face-to-face time and in reviewing records during pre-charting, more than 50% of which was spent in counseling and coordination of care, reviewing test results, reviewing medications and treatment regimen and/or in discussing or reviewing the diagnosis of migraine, the prognosis and treatment options. Pertinent laboratory and imaging test results that were available during this visit with the patient were reviewed by  me and considered in my medical decision making (see chart for details). '

## 2022-01-08 NOTE — Telephone Encounter (Signed)
Okay to use any slot for her first botox inj.

## 2022-01-08 NOTE — Telephone Encounter (Signed)
Patient Advocate Encounter  Prior Authorization for Botox 200UNIT solution has been approved.    PA# [id number] Effective dates: 01/08/2022 through 04/10/2022  Can be filled at [place], [place] Patient [place] Patient Advocate Team Direct Number: [phone]  Fax: [phone]

## 2022-01-08 NOTE — Telephone Encounter (Signed)
LVM informing pt of botox appointment scheduled with Dr. Rexene Alberts for 11/29 at 8:45am and asked pt to call back to reschedule if not available for that date and time

## 2022-01-08 NOTE — Telephone Encounter (Signed)
Dr. Rexene Alberts is booking out into December. Would it be okay to use a sleep consult spot?

## 2022-01-08 NOTE — Telephone Encounter (Signed)
Chronic Migraine CPT 64615  95874/EMG Guidance   Botox J0585 Units: 200  G43.719 Chronic Migraine without aura,  intractable, without status migrainous  New start auth needed for Botox with Dr Star Age

## 2022-01-08 NOTE — Telephone Encounter (Signed)
Patient Advocate Encounter   Received notification that prior authorization for Botox 200UNIT solution is required.   PA submitted on 01/08/2022 Key [id number] Status is pending       Lyndel Safe, [place] Patient Advocate Specialist [place] Patient Advocate Team Direct Number: [phone]  Fax: [phone]

## 2022-01-08 NOTE — Telephone Encounter (Signed)
[employer] medicare NPR sent to [facility] [phone]

## 2022-01-08 NOTE — Patient Instructions (Addendum)
It was nice to meet you today.  As you have tried and failed multiple medications for migraine headaches, we will proceed with insurance authorization for Botox injections for migraines.  We are going to request insurance authorization for Botox injections for your intractable migraine headaches.  We will call you once we have insurance approval and schedule your first injection appointment at which time we will ask you to sign a consent form and for every injection you will be re-consented.  Please check with your primary care office as they do not have an updated prescription list for you.  Please send [place] an updated list of medications that you are currently on and also check with your primary care as to what medications they have tried you on for migraine headaches.  You can send [place] a list on MyChart.

## 2022-01-11 ENCOUNTER — Other Ambulatory Visit (HOSPITAL_COMMUNITY): Payer: Self-pay

## 2022-01-14 ENCOUNTER — Telehealth: Payer: Self-pay | Admitting: Neurology

## 2022-01-14 NOTE — Telephone Encounter (Signed)
Pt is calling. Requesting a sooner appointment for BOTOX. Stated she will be going out of town Nov 18th- 29th. Pt said she don't want a migraine while out of town.

## 2022-01-14 NOTE — Telephone Encounter (Signed)
Can you offer her sooner Botox on 11/13 at 745?

## 2022-01-17 ENCOUNTER — Other Ambulatory Visit: Payer: Self-pay

## 2022-01-17 ENCOUNTER — Encounter: Payer: Self-pay | Admitting: Allergy & Immunology

## 2022-01-17 ENCOUNTER — Other Ambulatory Visit (HOSPITAL_COMMUNITY): Payer: Self-pay

## 2022-01-17 DIAGNOSIS — L539 Erythematous condition, unspecified: Secondary | ICD-10-CM

## 2022-01-17 MED ORDER — EPINEPHRINE 0.3 MG/0.3ML IJ SOAJ: 0.3000 mg | INTRAMUSCULAR | 1 refills | Status: DC | PRN

## 2022-01-17 MED ORDER — LEVOCETIRIZINE DIHYDROCHLORIDE 5 MG PO TABS: 5.0000 mg | ORAL_TABLET | Freq: Two times a day (BID) | ORAL | 5 refills | Status: DC

## 2022-01-17 MED ORDER — TRIAMCINOLONE ACETONIDE 0.1 % EX OINT: 1.0000 | TOPICAL_OINTMENT | Freq: Two times a day (BID) | CUTANEOUS | 1 refills | Status: DC

## 2022-01-17 NOTE — Telephone Encounter (Signed)
Patient called due to having an allergic reaction to cats. She is out of her triamcinolone cream and epi pen is expired. She was last seen 12/2021 so I also sent in a new epi pen and xyzal for allergy symptoms. Her arms are red and not having any issues with her breathing. Some swelling on her elbows.

## 2022-01-24 ENCOUNTER — Ambulatory Visit (INDEPENDENT_AMBULATORY_CARE_PROVIDER_SITE_OTHER): Payer: Medicare Other | Admitting: Neurology

## 2022-01-24 VITALS — BP 148/110 | HR 82 | Ht 64.0 in | Wt 146.0 lb

## 2022-01-24 DIAGNOSIS — R569 Unspecified convulsions: Secondary | ICD-10-CM

## 2022-01-24 MED ORDER — LEVETIRACETAM 500 MG PO TABS: ORAL_TABLET | ORAL | 3 refills | Status: DC

## 2022-01-24 NOTE — Progress Notes (Signed)
PATIENT: Claire [name] DOB: 01/29/1971  REASON FOR VISIT: follow up HISTORY FROM: patient Primary Neurologist: Dr. Rexene Alberts for migraines   HISTORY OF PRESENT ILLNESS: Today 01/24/22 Here today for follow-up for seizures.  Remains on Keppra.  Saw Dr. Rexene Alberts 01/08/2022 for migraine headaches.  Planning to arrange for Botox, scheduled next week. Black out spells started in 2014, Dr. Jannifer Franklin put on [place] for empiric trial and no further spells so we have left Keppra alone. Also on Lamictal 100 mg twice daily for the mood stabilization. Has PTSD, anxiety, panic disorder. EEG was normal in May 2014. MRI brain was normal. NCV/EMG was normal May 2016. Remains on mental heath counseling, which is helping. Wishes to remain on Keppra.   Update 08/24/20 SS: Claire [name] is a 51 year old female with history of fibromyalgia and seizure-like episodes.  Is on Keppra.  Seizure-like events are described as confusion, amnesia for 30 to 60 minutes.  No further spells in the last year.  Denies any side effects of Keppra.  Sees pain management, for fibro and chronic low back pain.  Uses a cane to ambulate.  Lives alone, drives a car, lives in [place].  Had left rotator cuff surgery in April, remains in PT.  Has had several GI illnesses this year, has lost 20 pounds.  Here today for evaluation unaccompanied.  HISTORY  08/24/2019 Dr. Jannifer Franklin: Claire [name] is a 51 year old left-handed [place] female with a history of fibromyalgia and seizure-like episodes in the past.  The patient was last seen through this office in July 2018 for this reason.  The patient was on Keppra and had a good response to the medication, she had not had any events while on the medication.  It appears that she stopped the medication when she had no insurance and could not afford the drug and then began having seizure type events again.  Last such event was 2 weeks ago.  The patient will have events where she becomes confused, she has amnesia for 30 to 60 minutes.  She  does not have generalized jerking or tongue biting or bowel and bladder incontinence.  The patient is still operating a motor vehicle apparently.  She is followed through a pain center for her fibromyalgia.  She is having troubles with gait instability, she has chronic low back pain, she uses a cane to get around and she will occasionally fall.  She does not drink alcohol.  She comes to this office for an evaluation.  REVIEW OF SYSTEMS: Out of a complete 14 system review of symptoms, the patient complains only of the following symptoms, and all other reviewed systems are negative.  See HPI  ALLERGIES: Allergies  Allergen Reactions   Baclofen Shortness Of Breath    Swollen ankles   Duloxetine Hives    Other reaction(s): Other (See Comments) "Hives, forgot who I was"   Penicillins Hives    Has patient had a PCN reaction causing immediate rash, facial/tongue/throat swelling, SOB or lightheadedness with hypotension: Unknown Has patient had a PCN reaction causing severe rash involving mucus membranes or skin necrosis: Yes Has patient had a PCN reaction that required hospitalization: Unknown Has patient had a PCN reaction occurring within the last 10 years: Unknown If all of the above answers are "NO", then may proceed with Cephalosporin use. Other reaction(s): Other (See Comments) Has patient had a PCN reaction causing immediate rash, facial/tongue/throat swelling, SOB or lightheadedness with hypotension: Unknown Has patient had a PCN reaction causing severe rash involving mucus  membranes or skin necrosis: Yes Has patient had a PCN reaction that required hospitalization: Unknown Has patient had a PCN reaction occurring within the last 10 years: Unknown If all of the above answers are "NO", then may proceed with Cephalosporin use.   Sulfa Antibiotics Hives, Nausea And Vomiting and Other (See Comments)   Benadryl [Diphenhydramine]     Started throwing up   Gluten Meal    Penicillin V Potassium  Other (See Comments)   Claritin [Loratadine] Other (See Comments)    shaking   Duloxetine Hcl Hives and Other (See Comments)    "Hives, forgot who I was"   Ultram [Tramadol] Other (See Comments)    Dizzy    HOME MEDICATIONS: Outpatient Medications Prior to Visit  Medication Sig Dispense Refill   albuterol (PROVENTIL) (2.5 MG/3ML) 0.083% nebulizer solution Take 3 mLs (2.5 mg total) by nebulization every 4 (four) hours as needed for wheezing or shortness of breath. 150 mL 1   albuterol (VENTOLIN HFA) 108 (90 Base) MCG/ACT inhaler Inhale two puffs every 4-6 hours if needed for cough or wheeze. 18 g 1   botulinum toxin Type A (BOTOX) 200 units injection Provider to inject 155 units into the muscles of the head and neck every 12 weeks. Discard remainder. 1 each 1   budesonide-formoterol (SYMBICORT) 160-4.5 MCG/ACT inhaler Inhale 2 puffs into the lungs daily.     butalbital-apap-caffeine-codeine (FIORICET WITH CODEINE) 50-325-40-30 MG capsule Take 1 capsule by mouth every 4 (four) hours as needed for headache.     chlorthalidone (HYGROTON) 25 MG tablet Take 25 mg by mouth every morning.     diphenhydrAMINE (BENADRYL) 25 MG tablet Take 50 mg by mouth as needed.     DUPIXENT 300 MG/2ML prefilled syringe Inject into the skin.     EPINEPHrine 0.3 mg/0.3 mL IJ SOAJ injection Inject 0.3 mg into the muscle as needed for anaphylaxis. 1 each 1   escitalopram (LEXAPRO) 20 MG tablet Take 1 tablet (20 mg total) by mouth daily. 90 tablet 0   famotidine (PEPCID) 20 MG tablet Take 20 mg by mouth at bedtime.     Fluticasone Furoate (ARNUITY ELLIPTA) 200 MCG/ACT AEPB Inhale 1 puff into the lungs at bedtime. 30 each 5   hydrOXYzine (ATARAX) 50 MG tablet Take 50 mg by mouth at bedtime as needed.     ibuprofen (ADVIL) 400 MG tablet Take 400 mg by mouth every 8 (eight) hours as needed.     lamoTRIgine (LAMICTAL) 100 MG tablet Take 100 mg by mouth 2 (two) times daily.     levETIRAcetam (KEPPRA) 500 MG tablet TAKE 1  TABLET BY MOUTH TWICE DAILY **MUST  COME  TO  APPOINTMENT** 180 tablet 2   levocetirizine (XYZAL) 5 MG tablet Take 1 tablet (5 mg total) by mouth in the morning and at bedtime. 60 tablet 5   linaclotide (LINZESS) 290 MCG CAPS capsule TAKE 1 CAPSULE BY MOUTH ONCE DAILY 30  MINUTES  BEFORE  BREAKFAST 90 capsule 3   metaxalone (SKELAXIN) 800 MG tablet Take 800 mg by mouth 3 (three) times daily.     methocarbamol (ROBAXIN) 500 MG tablet Take 500 mg by mouth 3 (three) times daily as needed.     metoprolol succinate (TOPROL-XL) 25 MG 24 hr tablet Take 1 tablet (25 mg total) by mouth daily. 90 tablet 3   mirtazapine (REMERON) 45 MG tablet Take 1 tablet (45 mg total) by mouth at bedtime. 90 tablet 0   montelukast (SINGULAIR) 10 MG tablet  TAKE 1 TABLET BY MOUTH AT BEDTIME 30 tablet 5   Olopatadine HCl (PATADAY) 0.2 % SOLN Place 1 drop each eye once a day as needed for itchy watery eyes 2.5 mL 5   omeprazole (PRILOSEC) 40 MG capsule TAKE 1 CAPSULE BY MOUTH ONCE DAILY BEFORE BREAKFAST 30 capsule 5   ondansetron (ZOFRAN) 8 MG tablet 1 tablet as needed for nausea Orally every 8 hours as needed for 14 days     oxyCODONE-acetaminophen (PERCOCET) 10-325 MG tablet Take 1 tablet by mouth every 6 (six) hours as needed.     potassium chloride 20 MEQ/15ML (10%) SOLN SMARTSIG:15 By Mouth 1-2 Times Daily     rizatriptan (MAXALT) 10 MG tablet 1 tablet as needed for migraine. May repeat after 2 hours. Maximum of 20 mg per 24 hours. Orally Once a day for 7 day(s)     SAVELLA 50 MG TABS tablet Take 50 mg by mouth 2 (two) times daily.     telmisartan (MICARDIS) 40 MG tablet Take 40 mg by mouth daily.     TRELEGY ELLIPTA 200-62.5-25 MCG/ACT AEPB Inhale 1 puff into the lungs daily. 28 each 5   triamcinolone ointment (KENALOG) 0.1 % Apply 1 Application topically 2 (two) times daily. 453.6 g 1   No facility-administered medications prior to visit.    PAST MEDICAL HISTORY: Past Medical History:  Diagnosis Date   Acid reflux     Allergy    pollen, dust   Anxiety    Arthritis    Asthma    Carpal tunnel syndrome    bilateral   DDD (degenerative disc disease), cervical    Depression    Fibromyalgia    Hypertension    Hypokalemia    Migraine    Palpitations    Panic attacks    PTSD (post-traumatic stress disorder)    Seizures ([place])    unknown etiology; last seizure was 2 years ago; on meds.   Syncope and collapse    Tennis elbow    Ulcer    Vertigo     PAST SURGICAL HISTORY: Past Surgical History:  Procedure Laterality Date   BACK SURGERY  1993   BIOPSY  10/21/2016   Procedure: BIOPSY;  Surgeon: Daneil Dolin, MD;  Location: [facility];  Service: Endoscopy;;  gastric, esophagus   CARPAL TUNNEL RELEASE Left    2019   CHOLECYSTECTOMY     ESOPHAGOGASTRODUODENOSCOPY (EGD) WITH PROPOFOL N/A 10/21/2016   Procedure: ESOPHAGOGASTRODUODENOSCOPY (EGD) WITH PROPOFOL;  Surgeon: Daneil Dolin, MD;  Location: [facility];  Service: Endoscopy;  Laterality: N/A;  9:30am   GALLBLADDER SURGERY     SHOULDER SURGERY Right    SPINE SURGERY  1993   lumbar disc surgery    FAMILY HISTORY: Family History  Problem Relation Age of Onset   COPD Mother    Bronchitis Mother    Arthritis Mother    Depression Mother    Heart disease Mother    Hypertension Mother    Alcohol abuse Father    Early death Father        GSW   Migraines Sister    Colon cancer Maternal Grandmother    PKU Son    Sleep apnea Neg Hx     SOCIAL HISTORY: Social History   Socioeconomic History   Marital status: Single    Spouse name: Not on file   Number of children: 1   Years of education: HS   Highest education level: Not on file  Occupational History   Occupation: disability    Comment: unemployed  Tobacco Use   Smoking status: Never   Smokeless tobacco: Never  Vaping Use   Vaping Use: Never used  Substance and Sexual Activity   Alcohol use: Not Currently    Comment: drink occasionally   Drug use: No   Sexual  activity: Not Currently    Partners: Female    Birth control/protection: None  Other Topics Concern   Not on file  Social History Narrative   Patient is left handed.   Patient drinks very little caffeine.   Lives alone   Social Determinants of Health   Financial Resource Strain: Not on file  Food Insecurity: Not on file  Transportation Needs: Not on file  Physical Activity: Not on file  Stress: Not on file  Social Connections: Not on file  Intimate Partner Violence: Not on file   PHYSICAL EXAM  Vitals:   01/24/22 1310  BP: (!) 148/110  Pulse: 82  Weight: 146 lb (66.2 kg)  Height: '5\' 4"'$  (1.626 m)   Body mass index is 25.06 kg/m.  Generalized: Well developed, in no acute distress  Neurological examination  Mentation: Alert oriented to time, place, history taking. Follows all commands speech and language fluent Cranial nerve II-XII: Pupils were equal round reactive to light. Extraocular movements were full, visual field were full on confrontational test. Facial sensation and strength were normal. Head turning and shoulder shrug were normal and symmetric. Motor: The motor testing reveals 5 over 5 strength of all 4 extremities. Good symmetric motor tone is noted throughout.  Sensory: Sensory testing is intact to soft touch on all 4 extremities. No evidence of extinction is noted.  Coordination: Cerebellar testing reveals good finger-nose-finger and heel-to-shin bilaterally.  Gait and station: Gait is wide-based, antalgic, uses a single-point cane. Reflexes: Deep tendon reflexes are symmetric and normal bilaterally.   DIAGNOSTIC DATA (LABS, IMAGING, TESTING) - I reviewed patient records, labs, notes, testing and imaging myself where available.  Lab Results  Component Value Date   WBC 7.2 08/24/2021   HGB 14.5 08/24/2021   HCT 41.8 08/24/2021   MCV 94.8 08/24/2021   PLT 391 08/24/2021      Component Value Date/Time   NA 136 08/24/2021 1405   K 4.0 08/24/2021 1405    CL 105 08/24/2021 1405   CO2 25 08/24/2021 1405   GLUCOSE 86 08/24/2021 1405   BUN 12 08/24/2021 1405   CREATININE 0.72 08/24/2021 1405   CALCIUM 9.5 08/24/2021 1405   PROT 7.6 04/04/2015 1953   ALBUMIN 4.2 04/04/2015 1953   AST 18 04/04/2015 1953   ALT 16 04/04/2015 1953   ALKPHOS 61 04/04/2015 1953   BILITOT 0.4 04/04/2015 1953   GFRNONAA >60 08/24/2021 1405   GFRAA >60 12/10/2017 1819   No results found for: "CHOL", "HDL", "LDLCALC", "LDLDIRECT", "TRIG", "CHOLHDL" Lab Results  Component Value Date   HGBA1C 4.9 09/13/2016   No results found for: "VITAMINB12" Lab Results  Component Value Date   TSH 0.553 10/25/2014   ASSESSMENT AND PLAN 51 y.o. year old female  has a past medical history of Acid reflux, Allergy, Anxiety, Arthritis, Asthma, Carpal tunnel syndrome, DDD (degenerative disc disease), cervical, Depression, Fibromyalgia, Hypertension, Hypokalemia, Migraine, Palpitations, Panic attacks, PTSD (post-traumatic stress disorder), Seizures ([place]), Syncope and collapse, Tennis elbow, Ulcer, and Vertigo. here with:  1.  Seizure-like spells -No further spells since starting Keppra in 2014, presented as a blackout events -Continue Keppra 500 mg twice a  day, also on Lamictal 3 mg twice daily for mood management -Call for spells, follow-up in 1 year virtually  2.  Chronic migraine headaches -Scheduled for Botox next week with Dr. Rexene Alberts -MRI of the brain with and without contrast was normal in 2014, has been reordered but not yet completed   Butler Denmark, AGNP-C, DNP 01/24/2022, 1:13 PM [facility] [address] [place], [place] [address] [phone]

## 2022-01-24 NOTE — Patient Instructions (Signed)
Great to see you today  We will continue the [place] Call for any further spells You have an appointment to get Botox next week We will see you back in 1 year

## 2022-01-28 ENCOUNTER — Ambulatory Visit (INDEPENDENT_AMBULATORY_CARE_PROVIDER_SITE_OTHER): Payer: Medicare Other | Admitting: Neurology

## 2022-01-28 VITALS — BP 129/90 | HR 90

## 2022-01-28 DIAGNOSIS — G43719 Chronic migraine without aura, intractable, without status migrainosus: Secondary | ICD-10-CM

## 2022-01-28 MED ORDER — ONABOTULINUMTOXINA 200 UNITS IJ SOLR
155.0000 [IU] | Freq: Once | INTRAMUSCULAR | Status: AC
  Administered 2022-01-28: 155 [IU] via INTRAMUSCULAR

## 2022-01-28 MED ORDER — ONABOTULINUMTOXINA 200 UNITS IJ SOLR: 155.0000 [IU] | Freq: Once | INTRAMUSCULAR | Status: DC

## 2022-01-28 NOTE — Patient Instructions (Signed)
Please remember, botulinum toxin takes about 3-7 days to kick in. As discussed, this is not a pain shot. The purpose of the injections is to gradually improve your symptoms. In some patients it takes up to 2-3 weeks to make a difference and it wears off with time. Sometimes it may wear off before it is time for the next injection. We still should wait till the next 3 monthly injection, because injecting too frequently may cause you to develop immunity to the botulinum toxin. We are looking for a reduction in the severity and/or frequency of your symptoms. As a reminder, side effects to look out for are (but not limited to): mouth dryness, dryness of the eyes, heaviness of your head or muscle weakness, including droopy face or droopy eyelid(s), rarely: speech or swallowing difficulties and very rarely: breathing difficulties. Some people have transient neck pain or soreness which typically responds to over-the-counter anti-inflammatory medication and local heat application with a heat pad. If you think you have a severe reaction to the botulinum toxin, such as weakness, trouble speaking, trouble breathing, or trouble swallowing, you have to call 911 or have someone take you to the nearest emergency room. However, most people have either no or minimal side effects from the injections. It is normal to have a little bit of redness and swelling around the injection sites which usually improves after a few hours. Rarely, there may be a bruise that improves on its own. Most side effects reported are very mild and resolve within 10-14 days. Please feel free to call us if you have any additional questions or concerns: [phone] or email us through My Chart. We may have to adjust the dose over time, depending on your results from this injection and your overall response over time to this medication.   

## 2022-01-28 NOTE — Progress Notes (Signed)
 Claire [name] is a 51-year-old right-handed woman with an underlying medical history of hypertension, migraine headaches, fibromyalgia, history of seizure-like episodes, carpal tunnel syndrome, anxiety, depression, vertigo, arthritis, asthma, allergies, NASH, hypokalemia, status post back surgery, left ureter surgery, right shoulder surgery, carpal tunnel surgery, and gallbladder surgery, who presents for initial botulinum toxin injection for the diagnosis of intractable migraine headaches.  The patient is unaccompanied today.  I last saw her on 01/08/2022.  Please refer to that encounter for details of her history and examination and medications tried and failed.   She saw Sarah Slack, NP in follow-up on 01/24/2022 for her seizure-like events.  She was kept on Keppra.  Written informed consent for recurrent, 3 monthly intramuscular injections with botulinum toxin for this indication has been obtained and will be scanned into the patient's electronic chart. She demonstrated understanding and voiced agreement. I talked to the patient in detail about expectations, limitations, benefits as well as potential adverse effects of botulinum toxin injections. The patient understands that the side effects include (but are not limited to): Mouth dryness, dryness of eyes, speech and swallowing difficulties, respiratory depression or problems breathing, weakness of muscles including more distant muscles than the ones injected, flu-like symptoms, myalgias, injection site reactions such as redness, itching, swelling, pain, and infection.  200 units of botulinum toxin type A were reconstituted using preservative-free normal saline to a concentration of 10 units per 0.1 mL and drawn up into 1 mL tuberculin syringes.   Botox- 200 units x 1 vial, specialty pharmacy. Lot: [id number] Expiration: 04/2024 NDC: 0023-3921-02   Bacteriostatic 0.9% Sodium Chloride- 2.2 mL total Lot: [id number] Expiration: 11/17/2022 NDC:  0409-1966-02   Today, 01/28/2022: She reports feeling about the same, she has no new questions prior to her first injection.  She reports that she is hoping that this will work for her.  O/E: BP (!) 129/90   Pulse 90   The patient was situated in a chair, sitting comfortably. After preparing the areas with 70% isopropyl alcohol and using a 26 gauge 1 1/2 inch hollow lumen recording EMG needle for the neck injections as well as a 30 gauge 1 inch needle for the facial injections, a total dose of 155 units of botulinum toxin type A in the form of Botox was injected into the muscles and the following distribution and quantities:  #1: 10 units on the right and 10 units in the left frontalis muscles, broken down in 2 sites on each side. #2: 5 units in the right and 5 units in the left corrugator muscles. #3: 15 units in the right and 15 units in the left occipitalis muscles, broken down in 3 sites on each side. #4: 20 units in the right and 20 units in the left temporalis muscles, broken down in 4 sites on each side.  #5: 15 units on the right and 15 units in the left upper trapezius muscles, broken down in 3 sites on each side. #6: 10 units in the right and 10 units in the left splenius capitis muscles, broken down in 2 sites on each side.  #7: 2.5 units in the right and 2.5 units in the left procerus muscles.  EMG guidance was utilized for the neck injections with mild to moderatae EMG activity noted, especially in the b/l splenius capitis muscles.  A dose of 45 units out of a total dose of 200 units was discarded as unavoidable waste.   The patient tolerated the procedure well without immediate complications. She   was advised to make a followup appointment for repeat injections in 3 months from now and encouraged to call us with any interim questions, concerns, problems, or updates. She was in agreement and did not have any questions prior to leaving clinic today.  Previously:   01/08/22: reports  a longstanding history of migraine headaches.  She reports that she has had migraines since her early 66s.  Her sister has migraines.  I reviewed your office note from 10/22/2021.   She had seen Dr. Jannifer Franklin and nurse practitioner Butler Denmark for seizure-like episodes.  She has not been seen in this clinic for over 1 year.  She was last seen by Butler Denmark, NP on 08/24/2020 and I reviewed the note, I copied the note below for reference.  She was last seen by Dr. Margette Fast on 08/24/2019 and I reviewed the note.  I copied the note below for reference.   She reports a history of seizures, is not clear on what type of seizures she has.  She has not had any seizure-like events in about 2 years, she believes that the [place] has been helpful.   She is currently on multiple medications including centrally acting medications. However, she does not take all the medications that are listed on your list, she is not sure about the exact list of her medications, she is also not sure as to what medications she has tried for migraines.  She has been prescribed Maxalt but reports that it did not help.  She is no longer on Fioricet, she is currently on levetiracetam 500 mg twice daily, no longer on Norco 10 mg strength, she takes oxycodone 5 mg strength 3 times a day as needed, she is still on hydroxyzine as I understand, she is no longer on metaxalone, she is on Savella, she is taking Robaxin as well.  She takes Lamictal, she believes it is 100 mg once daily.   She reports that her sister takes Topamax.  She also is not sure if she herself tolerated normal.  She has never been on Botox injections.  She would be willing to consider Botox injections and we talked about the injections, potential side effects and she was also given additional patient information.  She is willing to proceed with the authorization for Botox injections for migraines.  Her migraines are often generalized, she has associated light sensitivity, sometimes  she gets blurry vision with them.  She had an eye examination last year, none this year, she is encouraged to make an appointment for this year.  She has prescription bifocal eyeglasses.   She denies any sudden onset of one-sided weakness or numbness or tingling.   She had a brain MRI in 2014 which was benign.  She would be willing to proceed with a brain MRI.  She does not smoke, she does not drink any alcohol, she does not drink caffeine daily.  She hydrates with water but drinks about 70 ounces of water per day, she has a history of low potassium.  She had blood work through your office on 10/22/2021 and I reviewed the results: BUN was 17, creatinine 0.88, potassium borderline elevated at 6.1, calcium slightly elevated at 10.9, alk phos 92, AST 13, ALT 13.  CBC with differential and platelets showed borderline elevated hemoglobin at 15.8, hematocrit borderline elevated at 45.9.  TSH normal at 0.53.  Lipid panel benign.   She has over 15 headache days per month, migraine attacks are about 4-5 times a week.  I had evaluated her about 2-1/2 years ago for sleep apnea concern.  A sleep study in April 2021 did not show any significant obstructive sleep apnea.     Previously:   08/24/2020 (SS): <<Ms. Ruggiero is a 51 year old female with history of fibromyalgia and seizure-like episodes.  Is on Keppra.  Seizure-like events are described as confusion, amnesia for 30 to 60 minutes.  No further spells in the last year.  Denies any side effects of Keppra.  Sees pain management, for fibro and chronic low back pain.  Uses a cane to ambulate.  Lives alone, drives a car, lives in [place].  Had left rotator cuff surgery in April, remains in PT.  Has had several GI illnesses this year, has lost 20 pounds.  Here today for evaluation unaccompanied.>>   08/24/2019 (Dr. Jannifer Franklin): <<Ms. Dykes is a 51 year old left-handed [place] female with a history of fibromyalgia and seizure-like episodes in the past.  The patient was last seen  through this office in July 2018 for this reason.  The patient was on Keppra and had a good response to the medication, she had not had any events while on the medication.  It appears that she stopped the medication when she had no insurance and could not afford the drug and then began having seizure type events again.  Last such event was 2 weeks ago.  The patient will have events where she becomes confused, she has amnesia for 30 to 60 minutes.  She does not have generalized jerking or tongue biting or bowel and bladder incontinence.  The patient is still operating a motor vehicle apparently.  She is followed through a pain center for her fibromyalgia.  She is having troubles with gait instability, she has chronic low back pain, she uses a cane to get around and she will occasionally fall.  She does not drink alcohol.  She comes to this office for an evaluation.>> 07/13/19 (SA): Ms. Cerveny is a 50 year old left-handed woman with an underlying medical history of asthma, hypertension, migraine, fibromyalgia and blackout spells, suspected seizures on Keppra (for which she has seen Dr. Jannifer Franklin), allergies, acid reflux, degenerative disc disease, depression, anxiety, history of syncope, PTSD by chart review, palpitations, vertigo, and overweight state, who Presents for follow-up consultation of her sleep disturbance, after interim sleep testing.  The patient is unaccompanied today.  I first met her on 06/09/2019 at the request of her allergy and asthma specialist, at which time the patient reported snoring and excessive daytime somnolence.  She had trouble going to sleep and staying asleep.  She was advised to proceed with a sleep study.  She had a baseline sleep study on 06/24/2019, which showed a sleep efficiency of 94.6%, sleep was primarily light stage sleep with over 90% of stage II sleep and absence of REM sleep.  Her total AHI was 1.4/h, O2 nadir was 84%, average oxygen saturation was 90%.  She had no significant  PLM's.  Mild and intermittent snoring was noted. She was advised regarding her test results by phone call.  She requested a follow-up appointment to discuss findings in more detail.   She reports no new symptoms, as far as her sleep is concerned.  She reports that at the time of her sleep study she fell asleep much quicker than usual at home.  She did not feel fully rested, she did not think she slept nearly as much as the sleep study indicated.  We talked about her sleep study results in detail.  She feels that the BuSpar is not as effective for her anxiety.  She has an appointment with her psychiatrist/behavioral health coming up.  She has also made a follow-up appointment with Dr. Jannifer Franklin.     06/09/19 (SA): (She) reports snoring and excessive daytime somnolence.  I reviewed the office note from 05/13/2019.  Her Epworth sleepiness score is 0/24, Fatigue severity score is 62 out of 63.  She reports trouble going to sleep and staying asleep.  She has tried melatonin over-the-counter which did not help.  She does snore.  She is single and lives alone but has been told by a cousin that she had pauses in her breathing when she was witnessed to sleep on the couch one time.  The patient denies any recurrent morning headaches but does have recurrent headaches she reports.  She has not made a follow-up appointment with Dr. Jannifer Franklin but would like to make one on her way out today.  She reports increase in stress.  She reports not being able to stay asleep.  She has no obvious family history of sleep apnea as she recalls.  She does not have a TV in her bedroom but sometimes does look at her phone at night.  She has a bedtime between 11 and midnight and rise time generally between 6 and 7.  She is a non-smoker and does not drink alcohol.  She drinks very little caffeine, approximately 1 soda per week.

## 2022-01-28 NOTE — Progress Notes (Deleted)
Subjective:    Patient ID: Claire [name] is a 51 y.o. female.  HPI {Common ambulatory SmartLinks:[id number]}  Review of Systems  Neurological:        Pt here for Botox injection     Objective:  Neurological Exam  Physical Exam  Assessment:   ***  Plan:   ***

## 2022-01-29 ENCOUNTER — Encounter: Payer: Self-pay | Admitting: Family

## 2022-01-29 ENCOUNTER — Ambulatory Visit (INDEPENDENT_AMBULATORY_CARE_PROVIDER_SITE_OTHER): Payer: Medicare Other | Admitting: Family

## 2022-01-29 VITALS — Wt 137.0 lb

## 2022-01-29 DIAGNOSIS — J3089 Other allergic rhinitis: Secondary | ICD-10-CM | POA: Diagnosis not present

## 2022-01-29 DIAGNOSIS — T7800XD Anaphylactic reaction due to unspecified food, subsequent encounter: Secondary | ICD-10-CM | POA: Diagnosis not present

## 2022-01-29 DIAGNOSIS — J4550 Severe persistent asthma, uncomplicated: Secondary | ICD-10-CM

## 2022-01-29 DIAGNOSIS — R21 Rash and other nonspecific skin eruption: Secondary | ICD-10-CM

## 2022-01-29 DIAGNOSIS — T63481D Toxic effect of venom of other arthropod, accidental (unintentional), subsequent encounter: Secondary | ICD-10-CM

## 2022-01-29 DIAGNOSIS — R911 Solitary pulmonary nodule: Secondary | ICD-10-CM

## 2022-01-29 DIAGNOSIS — T7800XA Anaphylactic reaction due to unspecified food, initial encounter: Secondary | ICD-10-CM

## 2022-01-29 NOTE — Telephone Encounter (Signed)
If she is interested I am available to do a televisit today

## 2022-01-29 NOTE — Progress Notes (Signed)
RE: Claire [name] MRN: [id number] DOB: 02-19-1971 Date of Telemedicine Visit: 01/29/2022  Referring provider: [place],* Primary care provider: [place]  Chief Complaint: Rash (Tele-visit rash b arms and chest) and Follow-up   Telemedicine Follow Up Visit via Telephone: I connected with Claire [name] for a follow up on 01/29/22 by telephone and verified that I am speaking with the correct person using two identifiers.   I discussed the limitations, risks, security and privacy concerns of performing an evaluation and management service by telephone and the availability of in person appointments. I also discussed with the patient that there may be a patient responsible charge related to this service. The patient expressed understanding and agreed to proceed.  Patient is at home  Provider is at the office.  Visit start time: 10:48 AM v Visit end time: 11:15 AM Insurance consent/check in by: [place] consent and medical assistant/nurse: Estill Batten  History of Present Illness: She is a 51 y.o. 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 Her previous allergy office visit was on December 28, 2021 with Dr. Ernst Bowler.    She reports a rash that started on her arms on November 2.  The rash is described as itchy.  Since starting the triamcinolone, the rash is 90% better.  She is also taking Xyzal 5 mg twice a day, but does not feel like it is helpful for her allergies and the itching.  She describes the rash as now little patches that are are the size of a droplet.  They are occurring on her chest, her arms, and her neck.  She has been using the triamcinolone every 2 or 3 days.  She wonders if the rash could be due to dust or all the cats.  She mentions people drop-off cats at her house and she is allergic to them.  The rash does not move  around or cause bruising.  She denies any new medications, but does mention some of the medications dosages have been increased and she has stopped some of the medications.  She no longer takes Benadryl due to it making her sick.  She is not using any new products.  She denies any new foods, but does mention that she is having to eat more foods out of cans due to her budget.  She reports that she always has hot and cold flashes that she attributes to menopause.  And she always has joint pain, but this occurred prior to the rash.  No one else lives in the house, but her.  She was not sick prior to this rash occurred.  She does have a few flea bites and  she does mention that she does have fleas in her yard.  Severe persistent asthma: She continues to take Trelegy 200 mcg 1 puff once a day and Dupixent 300 mg injections every 2 weeks.  She denies any problems with her Dupixent injections.  She did not ever start the Arnuity 200 mcg 1 puff at night as recommended by Dr. Ernst Bowler at her last office visit due to being scared to take the medication because it has some of the same medications in it as Trelegy does.  She reports a dry cough that sometimes occur due to her sinuses.  She also has a little wheezing that gets better with albuterol.  She reports shortness of breath  when she walks from her house to her mailbox.  He attributes this to her older age.  She denies nocturnal awakenings due to breathing problems, but does mention that her watch is telling her that she does have episodes of hypopnea.  She does mention that she has had a sleep study in the past and feels like she only slept for 15 minutes.  I recommended a referral to her neurologist and she reports that she already sees a neurologist.  Recommended her speak with her neurologist about her concerns for hypopnea.  She also mentions that she did see her cardiologist few months ago and was told that everything looked good.  Allergic rhinitis: She  reports rhinorrhea that is clear sometimes, green sometimes, and once in a while she will have a little bit of blood.  She also has nasal congestion at night and a lot of postnasal drip.  She cannot stand putting anything in her nose.  She continues to take Xyzal 5 mg once a day.  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  She reports that her EpiPen is up-to-date.  She did mention that she ate some gummy bears that were made with the same machinery where they make products using wheat.  She did not get sick.   Stinging insect allergy: She reports that she has not had any insect stings since her last office visit.  She did find a tick walking on her, but it was not embedded    Assessment and Plan: Claire [name] is a 51 y.o. female with: Patient Instructions  Severe persistent asthma  - Daily controller medication(s): Trelegy 200/62.5/25 one puff once daily in the morning AND Arnuity 277mg one puff at night AND Dupixent '300mg'$  every two weeks - Prior to physical activity: albuterol 2 puffs 10-15 minutes before physical activity. - Rescue medications: albuterol 4 puffs every 4-6 hours as needed - Asthma control goals:  * Full participation in all desired activities (may need albuterol before activity) * Albuterol use two time or less a week on average (not counting use with activity) * Cough interfering with sleep two time or less a month * Oral steroids no more than once a year * No hospitalizations  2. Allergic rhinitis (cat dander, tree pollen, weed pollen, grass, and dog) - Continue Xyzal '5mg'$  to twice daily. - Continue Pataday one drop per eye daily. - Consider allergy shots for long term control, but this would require weekly visits to the office for a period of time.   3. 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 -  Continue to avoid all of these items above.  4. 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 - EpiPen refilled today.  5. Rash- 90% better -Continue triamcinolone using 1 application sparingly twice a day as needed - Continue Xyzal (levocetirizine) 5 mg twice a day as needed for itching - If this continues to be a problem we can consider lab work   Recommend speaking with your neurologist about your concerns for hypopnea/sleep apnea  6.Schedule a follow up appointment in 6 weeks or sooner if needed    Return in about 6 weeks (around 03/12/2022), or if symptoms worsen or fail to improve.  No orders of the defined types were placed in this encounter.  Lab Orders  No laboratory test(s) ordered today  Diagnostics: None.  Medication List:  Current Outpatient Medications  Medication Sig Dispense Refill   albuterol (PROVENTIL) (2.5 MG/3ML) 0.083% nebulizer solution Take 3 mLs (2.5 mg total) by nebulization every 4 (four) hours as needed for wheezing or shortness of breath. 150 mL 1   albuterol (VENTOLIN HFA) 108 (90 Base) MCG/ACT inhaler Inhale two puffs every 4-6 hours if needed for cough or wheeze. 18 g 1   botulinum toxin Type A (BOTOX) 200 units injection Provider to inject 155 units into the muscles of the head and neck every 12 weeks. Discard remainder. 1 each 1   budesonide-formoterol (SYMBICORT) 160-4.5 MCG/ACT inhaler Inhale 2 puffs into the lungs daily.     butalbital-apap-caffeine-codeine (FIORICET WITH CODEINE) 50-325-40-30 MG capsule Take 1 capsule by mouth every 4 (four) hours as needed for headache.     chlorthalidone (HYGROTON) 25 MG tablet Take 25 mg by mouth every morning.     DUPIXENT 300 MG/2ML prefilled syringe Inject into the skin.     EPINEPHrine 0.3 mg/0.3 mL IJ SOAJ injection Inject 0.3 mg into the muscle as needed for anaphylaxis. 1 each 1   famotidine (PEPCID) 20 MG tablet Take 20 mg by mouth at bedtime.     hydrOXYzine (ATARAX) 50 MG  tablet Take 50 mg by mouth at bedtime as needed.     ibuprofen (ADVIL) 400 MG tablet Take 400 mg by mouth every 8 (eight) hours as needed.     lamoTRIgine (LAMICTAL) 100 MG tablet Take 100 mg by mouth 2 (two) times daily.     levETIRAcetam (KEPPRA) 500 MG tablet TAKE 1 TABLET BY MOUTH TWICE DAILY 180 tablet 3   levocetirizine (XYZAL) 5 MG tablet Take 1 tablet (5 mg total) by mouth in the morning and at bedtime. 60 tablet 5   linaclotide (LINZESS) 290 MCG CAPS capsule TAKE 1 CAPSULE BY MOUTH ONCE DAILY 30  MINUTES  BEFORE  BREAKFAST 90 capsule 3   metoprolol succinate (TOPROL-XL) 25 MG 24 hr tablet Take 1 tablet (25 mg total) by mouth daily. 90 tablet 3   montelukast (SINGULAIR) 10 MG tablet TAKE 1 TABLET BY MOUTH AT BEDTIME 30 tablet 5   Olopatadine HCl (PATADAY) 0.2 % SOLN Place 1 drop each eye once a day as needed for itchy watery eyes 2.5 mL 5   omeprazole (PRILOSEC) 40 MG capsule TAKE 1 CAPSULE BY MOUTH ONCE DAILY BEFORE BREAKFAST 30 capsule 5   ondansetron (ZOFRAN) 8 MG tablet 1 tablet as needed for nausea Orally every 8 hours as needed for 14 days     oxyCODONE-acetaminophen (PERCOCET) 10-325 MG tablet Take 1 tablet by mouth every 6 (six) hours as needed.     potassium chloride 20 MEQ/15ML (10%) SOLN SMARTSIG:15 By Mouth 1-2 Times Daily     TRELEGY ELLIPTA 200-62.5-25 MCG/ACT AEPB Inhale 1 puff into the lungs daily. 28 each 5   triamcinolone ointment (KENALOG) 0.1 % Apply 1 Application topically 2 (two) times daily. 453.6 g 1   diphenhydrAMINE (BENADRYL) 25 MG tablet Take 50 mg by mouth as needed. (Patient not taking: Reported on 01/29/2022)     escitalopram (LEXAPRO) 20 MG tablet Take 1 tablet (20 mg total) by mouth daily. 90 tablet 0   metaxalone (SKELAXIN) 800 MG tablet Take 800 mg by mouth 3 (three) times daily.     methocarbamol (ROBAXIN) 500 MG tablet Take 500 mg by mouth 3 (three) times daily as needed.     mirtazapine (REMERON) 45 MG tablet Take 1 tablet (45  mg total) by mouth at  bedtime. 90 tablet 0   rizatriptan (MAXALT) 10 MG tablet 1 tablet as needed for migraine. May repeat after 2 hours. Maximum of 20 mg per 24 hours. Orally Once a day for 7 day(s) (Patient not taking: Reported on 01/29/2022)     SAVELLA 50 MG TABS tablet Take 50 mg by mouth 2 (two) times daily. (Patient not taking: Reported on 01/28/2022)     telmisartan (MICARDIS) 40 MG tablet Take 40 mg by mouth daily.     No current facility-administered medications for this visit.   Allergies: Allergies  Allergen Reactions   Baclofen Shortness Of Breath    Swollen ankles   Duloxetine Hives    Other reaction(s): Other (See Comments) "Hives, forgot who I was"   Penicillins Hives    Has patient had a PCN reaction causing immediate rash, facial/tongue/throat swelling, SOB or lightheadedness with hypotension: Unknown Has patient had a PCN reaction causing severe rash involving mucus membranes or skin necrosis: Yes Has patient had a PCN reaction that required hospitalization: Unknown Has patient had a PCN reaction occurring within the last 10 years: Unknown If all of the above answers are "NO", then may proceed with Cephalosporin use. Other reaction(s): Other (See Comments) Has patient had a PCN reaction causing immediate rash, facial/tongue/throat swelling, SOB or lightheadedness with hypotension: Unknown Has patient had a PCN reaction causing severe rash involving mucus membranes or skin necrosis: Yes Has patient had a PCN reaction that required hospitalization: Unknown Has patient had a PCN reaction occurring within the last 10 years: Unknown If all of the above answers are "NO", then may proceed with Cephalosporin use.   Sulfa Antibiotics Hives, Nausea And Vomiting and Other (See Comments)   Benadryl [Diphenhydramine]     Started throwing up   Gluten Meal    Penicillin V Potassium Other (See Comments)   Claritin [Loratadine] Other (See Comments)    shaking   Duloxetine Hcl Hives and Other (See  Comments)    "Hives, forgot who I was"   Ultram [Tramadol] Other (See Comments)    Dizzy   I reviewed her past medical history, social history, family history, and environmental history and no significant changes have been reported from previous visit on December 28, 2021.  Review of Systems  Constitutional:  Negative for fever.       Reports hot and cold flashes that she feels is due to menopause  HENT:         Reports rhinorrhea that is sometimes clear sometimes green and once in a blue moon she blows a little bit of blood from her nose.  She also reports nasal congestion at night and a lot of postnasal drip.  Eyes:        Denies itchy watery eyes  Respiratory:  Positive for cough, shortness of breath and wheezing. Negative for chest tightness.        Reports a cough that is dry sometimes and sometimes due to her sinuses.  She also have a little bit of wheezing for which albuterol helps.  She also has shortness of breath that is no worse since her last office visit.  She denies tightness in chest and nocturnal awakenings due to breathing problems  Cardiovascular:        Reports that at times her heart beats fast that she attributes to fussing at her dogs.  Gastrointestinal:        Denies heartburn or reflux symptoms  Genitourinary:  Negative for  frequency.  Skin:  Positive for rash.       Reports itchy rash that is on her arms, chest, and neck area is 90% better.   Objective: Physical Exam Not obtained as encounter was done via telephone.   Previous notes and tests were reviewed.  I discussed the assessment and treatment plan with the patient. The patient was provided an opportunity to ask questions and all were answered. The patient agreed with the plan and demonstrated an understanding of the instructions.   The patient was advised to call back or seek an in-person evaluation if the symptoms worsen or if the condition fails to improve as anticipated.  I provided 27 minutes of  non-face-to-face time during this encounter.  It was my pleasure to participate in [facility] [facility]'s care today. Please feel free to contact me with any questions or concerns.   Sincerely,  Althea Charon, FNP

## 2022-01-29 NOTE — Telephone Encounter (Signed)
Called and schedule a televisit with you today at 11:00.

## 2022-01-29 NOTE — Telephone Encounter (Signed)
Thank you :)

## 2022-01-29 NOTE — Patient Instructions (Addendum)
Severe persistent asthma  - Daily controller medication(s): Trelegy 200/62.5/25 one puff once daily in the morning AND Arnuity 257mg one puff at night AND Dupixent '300mg'$  every two weeks - Prior to physical activity: albuterol 2 puffs 10-15 minutes before physical activity. - Rescue medications: albuterol 4 puffs every 4-6 hours as needed - Asthma control goals:  * Full participation in all desired activities (may need albuterol before activity) * Albuterol use two time or less a week on average (not counting use with activity) * Cough interfering with sleep two time or less a month * Oral steroids no more than once a year * No hospitalizations  2. Allergic rhinitis (cat dander, tree pollen, weed pollen, grass, and dog) - Continue Xyzal '5mg'$  to twice daily. - Continue Pataday one drop per eye daily. - Consider allergy shots for long term control, but this would require weekly visits to the office for a period of time.   3. 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 - Continue to avoid all of these items above.  4. 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 - EpiPen refilled today.  5. Rash- 90% better -Continue triamcinolone using 1 application sparingly twice a day as needed - Continue Xyzal (levocetirizine) 5 mg twice a day as needed for itching - If this continues to be a problem we can consider lab work   Recommend speaking with your neurologist about your concerns for hypopnea/sleep apnea  6.Schedule a follow up appointment in 6 weeks or sooner if needed

## 2022-02-13 ENCOUNTER — Ambulatory Visit: Payer: Medicare Other | Admitting: Neurology

## 2022-02-19 ENCOUNTER — Other Ambulatory Visit (HOSPITAL_COMMUNITY): Payer: Self-pay

## 2022-02-25 LAB — CELIAC DISEASE AB SCREEN W/RFX
Antigliadin Abs, IgA: 3 units (ref 0–19)
IgA/Immunoglobulin A, Serum: 97 mg/dL (ref 87–352)
Transglutaminase IgA: <2 U/mL (ref 0–3)

## 2022-03-12 NOTE — Patient Instructions (Incomplete)
Severe persistent asthma  7 mm ill-defined pleural-based nodular density along the lateral  left lung base seen on CT chest without contrast on 11/28/21. Will refer to pulmonology due to nodular density and poorly controlled asthma Stop Arnuity 200 mcg one puff daily Consider switching to a different asthma biologic We will get lab work for severe to treat asthma. Will order a STAT chest x-ray due to the pain in your chest. We will call you with results once it is back. - Daily controller medication(s): Trelegy 200/62.5/25 one puff once daily in the morning AND Dupixent '300mg'$  every two weeks - Prior to physical activity: albuterol 2 puffs 10-15 minutes before physical activity. - Rescue medications: albuterol 4 puffs every 4-6 hours as needed - Asthma control goals:  * Full participation in all desired activities (may need albuterol before activity) * Albuterol use two time or less a week on average (not counting use with activity) * Cough interfering with sleep two time or less a month * Oral steroids no more than once a year * No hospitalizations  2. Allergic rhinitis (cat dander, tree pollen, weed pollen, grass, and dog) - Continue Xyzal '5mg'$  to twice daily. - Continue Pataday one drop per eye daily. - Consider allergy shots for long term control, but this would require weekly visits to the office for a period of time.   3. 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 - Continue to avoid all of these items above.  4. 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 - EpiPen up to date  5. Rash- 90% better -Continue triamcinolone using 1 application sparingly twice a day as needed - Continue Xyzal (levocetirizine) 5 mg twice a day as needed for itching - If this continues to be a problem we can consider lab work   Recommend scheduling an appointment with your cardiologist due to frequent chest pain and  palpitations 6.Schedule a follow up appointment in 4 weeks with Dr. Ernst Bowler or sooner if needed

## 2022-03-13 ENCOUNTER — Other Ambulatory Visit: Payer: Self-pay

## 2022-03-13 ENCOUNTER — Ambulatory Visit (INDEPENDENT_AMBULATORY_CARE_PROVIDER_SITE_OTHER): Payer: Medicare Other | Admitting: Family

## 2022-03-13 ENCOUNTER — Ambulatory Visit (HOSPITAL_COMMUNITY)
Admission: RE | Admit: 2022-03-13 | Discharge: 2022-03-13 | Disposition: A | Discharge status: Home / Self Care | Payer: Medicare Other | Source: Ambulatory Visit | Attending: Family | Admitting: Family

## 2022-03-13 ENCOUNTER — Other Ambulatory Visit: Payer: Self-pay | Admitting: Allergy & Immunology

## 2022-03-13 ENCOUNTER — Encounter: Payer: Self-pay | Admitting: Family

## 2022-03-13 VITALS — BP 114/72 | HR 110 | Temp 97.5°F | Resp 18 | Ht 64.0 in | Wt 141.6 lb

## 2022-03-13 DIAGNOSIS — J4550 Severe persistent asthma, uncomplicated: Secondary | ICD-10-CM | POA: Diagnosis not present

## 2022-03-13 DIAGNOSIS — R079 Chest pain, unspecified: Secondary | ICD-10-CM

## 2022-03-13 DIAGNOSIS — T7800XA Anaphylactic reaction due to unspecified food, initial encounter: Secondary | ICD-10-CM

## 2022-03-13 DIAGNOSIS — T63481D Toxic effect of venom of other arthropod, accidental (unintentional), subsequent encounter: Secondary | ICD-10-CM

## 2022-03-13 DIAGNOSIS — J3089 Other allergic rhinitis: Secondary | ICD-10-CM | POA: Diagnosis not present

## 2022-03-13 DIAGNOSIS — T7800XD Anaphylactic reaction due to unspecified food, subsequent encounter: Secondary | ICD-10-CM

## 2022-03-13 DIAGNOSIS — R059 Cough, unspecified: Secondary | ICD-10-CM | POA: Insufficient documentation

## 2022-03-13 DIAGNOSIS — R21 Rash and other nonspecific skin eruption: Secondary | ICD-10-CM

## 2022-03-13 NOTE — Progress Notes (Signed)
[place], [place] [address] Dept: [phone]  FOLLOW UP NOTE  Patient ID: Claire [name], female    DOB: [birth year]  Age: 51 y.o. MRN: [id number] Date of Office Visit: 03/13/2022  Assessment  Chief Complaint: Asthma (6 wk f/u - Pretty Good) and Allergic Rhinitis  (6 wk f/u - Sneezing x 4 dys. Patient states she was Dx Influenza and Sinusitis Rx Prednisone and antibiotic)  HPI Claire [name] is a 51 year old female who presents today for follow-up of severe persistent asthma, allergic rhinitis, anaphylaxis due to food, stinging insect allergy, and rash.  She was last seen via televisit on January 29, 2022 by myself.  She reports since her last office visit she has been diagnosed with influenza and sinus infection.  She is currently finishing up the last 2 to 3 days of antibiotic for the sinus infection.  Severe persistent asthma: She continues to take Trelegy 200 mcg 1 puff once a day, Arnuity 200 mcg 1 puff at night, and Dupixent 300 mg injection every 2 weeks.  She reports that she forgot to take her Dupixent injection this past Sunday, but will give today.  She is not certain that she can tell a difference in her breathing since starting Dupixent.  She asked if gluten is in [place].  Discussed that there has not gluten and Dupixent.  She reports a a lot of dry coughing and tightness in her chest that occurs when she is coughing.  She also reports that her chest feels heavy.  She has shortness of breath also.  She feels like this is due to being sick with a sinus infection.  She denies wheezing and nocturnal awakenings due to breathing problems.  She uses her albuterol inhaler once every other day.  She has been on 1 round of steroids since her last office visit.  She has not made any trips to the emergency room or urgent care due to breathing problems.  She also mentions for the past month that it has been hurting in her chest when she breathes.  She also mentions that she  has had imaging and her chest that shows something that she cannot get anybody to pay attention to.  After reviewing epic it shows that she had a CT chest on November 28, 2021 showing: "1. 7 mm ill-defined pleural-based nodular density along the lateral  left lung base. Per Fleischner Society Guidelines, recommend a  non-contrast Chest CT at 6-12 months. If patient is high risk for  malignancy, recommend an additional non-contrast Chest CT at 18-24  months; if patient is low risk for malignancy a non-contrast Chest  CT at 18-24 months is optional. These guidelines do not apply to  immunocompromised patients and patients with cancer. Follow up in  patients with significant comorbidities as clinically warranted. For  lung cancer screening, adhere to Lung-RADS guidelines. Reference:  Radiology. 2017; 284(1):228-43.  2. Cholecystectomy.  She has never seen a pulmonologist and has never smoked, but has been exposed to secondhand smoke growing up.  Allergic rhinitis: She continues to take Xyzal either once a day or twice a day.  She is not sure how often she is taking is taking Xyzal.  It depends on what the bottle says.  She is not able to do allergy injections due to not having the gas money.  She also does not like to use nose sprays.  She also does not have money for eyedrops.  She reports itchy watery eyes, postnasal  drip, clear rhinorrhea, nasal congestion, and times her nasal feel dry and every once a while she will have a nosebleed.  She has 1 cat in her house that sleeps on her chest.  She also has a dog in her house.  Discussed avoidance measures such as keeping her bedroom pet free and washing her hands after touching the cat and dog which she is allergic to.  Anaphylaxis due to food: She continues to avoid wheat, rye, barley, and oat without any accidental ingestion.  She reports her EpiPen is up-to-date and expires October 2024.  She has questions as when to use her EpiPen.  Emergency action  plan given and reviewed.  She mentions that gluten makes her sick and nauseous.  She also continues to avoid stinging insects and has not had any insect stings since her last office visit.  Rash: She reports that every once in a while she will get a rash on her chest.  She will use the triamcinolone within 2 days the rashes gone.  She also has Xyzal that she is taking.  Drug Allergies:  Allergies  Allergen Reactions   Baclofen Shortness Of Breath    Swollen ankles   Duloxetine Hives    Other reaction(s): Other (See Comments) "Hives, forgot who I was"   Penicillins Hives    Has patient had a PCN reaction causing immediate rash, facial/tongue/throat swelling, SOB or lightheadedness with hypotension: Unknown Has patient had a PCN reaction causing severe rash involving mucus membranes or skin necrosis: Yes Has patient had a PCN reaction that required hospitalization: Unknown Has patient had a PCN reaction occurring within the last 10 years: Unknown If all of the above answers are "NO", then may proceed with Cephalosporin use. Other reaction(s): Other (See Comments) Has patient had a PCN reaction causing immediate rash, facial/tongue/throat swelling, SOB or lightheadedness with hypotension: Unknown Has patient had a PCN reaction causing severe rash involving mucus membranes or skin necrosis: Yes Has patient had a PCN reaction that required hospitalization: Unknown Has patient had a PCN reaction occurring within the last 10 years: Unknown If all of the above answers are "NO", then may proceed with Cephalosporin use.   Sulfa Antibiotics Hives, Nausea And Vomiting and Other (See Comments)   Benadryl [Diphenhydramine]     Started throwing up   Gluten Meal    Penicillin V Potassium Other (See Comments)   Claritin [Loratadine] Other (See Comments)    shaking   Duloxetine Hcl Hives and Other (See Comments)    "Hives, forgot who I was"   Ultram [Tramadol] Other (See Comments)    Dizzy     Review of Systems: Review of Systems  Constitutional:  Negative for chills and fever.  HENT:         Reports clear rhinorrhea, postnasal drip, nasal congestion, nasal dryness, and some episodes of epistaxis  Respiratory:  Positive for cough.        Reports dry cough, tightness in her chest, shortness of breath, and pain in her chest when she breathes.  Denies wheezing, fever, chills, and nocturnal awakenings due to breathing problems.  Cardiovascular:  Positive for chest pain and palpitations.       Reports chest pain and palpitations.  She also reports that it feels like her heart is going to pop out of her chest.  She reports that she has  seen cardiology this year and was told everything was fine.  Gastrointestinal:        Denies heartburn or  reflux symptoms  Skin:  Positive for itching and rash.       Reports itchy rash that occurs every once a while on her chest that gets better with triamcinolone  Neurological:  Positive for headaches.       Reports a headache over her right eye  Endo/Heme/Allergies:  Positive for environmental allergies.     Physical Exam: BP 114/72   Pulse (!) 110   Temp (!) 97.5 F (36.4 C)   Resp 18   Ht '5\' 4"'$  (1.626 m)   Wt 141 lb 9.6 oz (64.2 kg)   SpO2 97%   BMI 24.31 kg/m    Physical Exam Constitutional:      Appearance: Normal appearance.  HENT:     Head: Normocephalic and atraumatic.     Comments: Pharynx normal, eyes normal, ears normal, nose: Bilateral lower turbinates mildly edematous and slightly erythematous with no drainage noted    Right Ear: Tympanic membrane, ear canal and external ear normal.     Left Ear: Tympanic membrane, ear canal and external ear normal.     Mouth/Throat:     Mouth: Mucous membranes are moist.     Pharynx: Oropharynx is clear.  Eyes:     Conjunctiva/sclera: Conjunctivae normal.  Cardiovascular:     Rate and Rhythm: Regular rhythm. Tachycardia present.     Heart sounds: Normal heart sounds.   Pulmonary:     Effort: Pulmonary effort is normal.     Breath sounds: Normal breath sounds.     Comments: Lungs clear to auscultation Musculoskeletal:     Cervical back: Neck supple.  Skin:    General: Skin is warm.  Neurological:     Mental Status: She is alert and oriented to person, place, and time.  Psychiatric:        Mood and Affect: Mood normal.        Behavior: Behavior normal.        Thought Content: Thought content normal.        Judgment: Judgment normal.     Diagnostics: FVC 3.24 L (104%).  FEV1 2.54 L (102%).  Predicted FVC 3.12 L, predicted FEV1 2.50 L.  Spirometry indicates normal respiratory function.  Assessment and Plan: 1. Severe persistent asthma, uncomplicated   2. Non-seasonal allergic rhinitis due to other allergic trigger   3. Chest pain, unspecified type   4. Cough, unspecified type   5. Anaphylaxis due to food   6. Insect sting allergy, current reaction, accidental or unintentional, subsequent encounter   7. Rash and nonspecific skin eruption     No orders of the defined types were placed in this encounter.   Patient Instructions  Severe persistent asthma  7 mm ill-defined pleural-based nodular density along the lateral  left lung base seen on CT chest without contrast on 11/28/21. Will refer to pulmonology due to nodular density and poorly controlled asthma Stop Arnuity 200 mcg one puff daily Consider switching to a different asthma biologic We will get lab work for severe to treat asthma. Will order a STAT chest x-ray due to the pain in your chest. We will call you with results once it is back. - Daily controller medication(s): Trelegy 200/62.5/25 one puff once daily in the morning AND Dupixent '300mg'$  every two weeks - Prior to physical activity: albuterol 2 puffs 10-15 minutes before physical activity. - Rescue medications: albuterol 4 puffs every 4-6 hours as needed - Asthma control goals:  * Full participation in all desired activities  (may  need albuterol before activity) * Albuterol use two time or less a week on average (not counting use with activity) * Cough interfering with sleep two time or less a month * Oral steroids no more than once a year * No hospitalizations  2. Allergic rhinitis (cat dander, tree pollen, weed pollen, grass, and dog) - Continue Xyzal '5mg'$  to twice daily. - Continue Pataday one drop per eye daily. - Consider allergy shots for long term control, but this would require weekly visits to the office for a period of time.   3. 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 - Continue to avoid all of these items above.  4. 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 - EpiPen up to date  5. Rash- 90% better -Continue triamcinolone using 1 application sparingly twice a day as needed - Continue Xyzal (levocetirizine) 5 mg twice a day as needed for itching - If this continues to be a problem we can consider lab work   Recommend scheduling an appointment with your cardiologist due to frequent chest pain and palpitations 6.Schedule a follow up appointment in 4 weeks with Dr. Ernst Bowler or sooner if needed   Return in about 4 weeks (around 04/10/2022), or if symptoms worsen or fail to improve.    Thank you for the opportunity to care for this patient.  Please do not hesitate to contact me with questions.  Althea Charon, FNP [facility]

## 2022-03-13 NOTE — Progress Notes (Signed)
Please let Claire [name] know that her chest x-ray is normal. I would still like for her to see the pulmonologist and her cardiologist. Also, continue her treatment as we discussed today in the office.

## 2022-03-21 ENCOUNTER — Telehealth: Payer: Self-pay

## 2022-03-21 NOTE — Telephone Encounter (Signed)
Referral has been placed. MyChart message sent to patient.

## 2022-03-21 NOTE — Telephone Encounter (Signed)
-----   Message from Althea Charon, [place] sent at 03/13/2022 12:44 PM EST ----- Please refer to pulmonology due to nodular density in lateral left lung base and poorly controlled asthma

## 2022-03-21 NOTE — Telephone Encounter (Signed)
Thank you :)

## 2022-03-23 LAB — ASPERGILLUS PRECIPITINS
A.Fumigatus #1 Abs: NEGATIVE
Aspergillus Flavus Antibodies: NEGATIVE
Aspergillus Niger Antibodies: NEGATIVE
Aspergillus glaucus IgG: NEGATIVE
Aspergillus nidulans IgG: NEGATIVE
Aspergillus terreus IgG: NEGATIVE

## 2022-03-23 LAB — ASPERGILLUS FUMIGATUS IGG: Aspergillus fumigatus IgG: 4 mg/L (ref 0.0–81.5)

## 2022-03-23 LAB — ANCA TITERS
Atypical pANCA: <1:20 {titer}
C-ANCA: <1:20 {titer}
P-ANCA: <1:20 {titer}

## 2022-03-23 LAB — IGE: IgE (Immunoglobulin E), Serum: 57 IU/mL (ref 6–495)

## 2022-03-23 LAB — ALPHA-1-ANTITRYPSIN: A-1 Antitrypsin: 160 mg/dL (ref 101–187)

## 2022-03-25 ENCOUNTER — Other Ambulatory Visit: Payer: Self-pay | Admitting: Allergy & Immunology

## 2022-04-01 NOTE — Progress Notes (Signed)
Please let Claire [name] know that we have obtained her labs. For some reason the cbc with diff was not completed.  Her other labs for severe asthma were unremarkable.   Sending to Dr. Ernst Bowler also, to keep him in the loop since she has a follow up with him on 04/26/22.

## 2022-04-02 ENCOUNTER — Other Ambulatory Visit (HOSPITAL_COMMUNITY): Payer: Self-pay

## 2022-04-03 ENCOUNTER — Other Ambulatory Visit (HOSPITAL_COMMUNITY): Payer: Self-pay

## 2022-04-03 ENCOUNTER — Telehealth: Payer: Self-pay | Admitting: Neurology

## 2022-04-03 NOTE — Telephone Encounter (Signed)
Please call patient and see if there has been any insurance change for 2024. If so, can they send a picture of front and back of card through mychart? If they cannot, please gather pt's insurance name, ID, Group #, and if there are Prescription benefits then we need the Rx ID, BIN, PCN, group numbers, and the provider pre-certification number from the back of the card. Thank you!

## 2022-04-03 NOTE — Telephone Encounter (Signed)
Pt is scheduled for botox injection for 04/22/22 and will need a new PA before appointment. Current PA expires on 04/10/22

## 2022-04-03 NOTE — Telephone Encounter (Signed)
Called pt. Left VM message to please call the office.

## 2022-04-04 ENCOUNTER — Other Ambulatory Visit (HOSPITAL_COMMUNITY): Payer: Self-pay

## 2022-04-04 ENCOUNTER — Other Ambulatory Visit: Payer: Self-pay

## 2022-04-04 NOTE — Telephone Encounter (Signed)
Pt has not yet uploaded pic to MyChart, sent message asking her to do so. I tried running her insurance from last year and the new one populated.  [facility] Medicare Effective 04/03/22 ID: [id number]

## 2022-04-05 ENCOUNTER — Other Ambulatory Visit (HOSPITAL_COMMUNITY): Payer: Self-pay

## 2022-04-05 NOTE — Progress Notes (Signed)
Yes, but it can wait until she sees Dr. Ernst Bowler on 04/26/22

## 2022-04-08 ENCOUNTER — Other Ambulatory Visit: Payer: Self-pay | Admitting: Allergy & Immunology

## 2022-04-09 ENCOUNTER — Other Ambulatory Visit: Payer: Self-pay | Admitting: Allergy & Immunology

## 2022-04-12 ENCOUNTER — Encounter (HOSPITAL_BASED_OUTPATIENT_CLINIC_OR_DEPARTMENT_OTHER): Payer: Self-pay | Admitting: Pulmonary Disease

## 2022-04-12 ENCOUNTER — Ambulatory Visit (INDEPENDENT_AMBULATORY_CARE_PROVIDER_SITE_OTHER): Payer: 59 | Admitting: Pulmonary Disease

## 2022-04-12 VITALS — BP 118/80 | HR 79 | Ht 64.0 in | Wt 141.2 lb

## 2022-04-12 DIAGNOSIS — R911 Solitary pulmonary nodule: Secondary | ICD-10-CM | POA: Insufficient documentation

## 2022-04-12 DIAGNOSIS — J4550 Severe persistent asthma, uncomplicated: Secondary | ICD-10-CM | POA: Insufficient documentation

## 2022-04-12 NOTE — Progress Notes (Unsigned)
Subjective:   PATIENT ID: Claire [name] GENDER: female DOB: 1970-04-09, MRN: [id number]  Chief Complaint  Patient presents with   Consult    Second hand smoke    Reason for Visit: New consult for lung nodule and asthma  [name] is a 52 year old female with COPD/asthma, seasonal allergies, fibromyalgia, GAD, depression, reflex, migraines, RLS, DDD, HTN, seizures who for evaluation for asthma and lung nodule.  After working 10 years in a [employer], she reports diagnosis of asthma in her 5s. Currently followed by Allergy and Asthma and is on Trelegy and Dupixent. Using Albuterol 2-3 times a day for shortness of breath. Has used nebulizer 1-3 times a week. Denies wheezing or frequent coughing. Triggered by strong odors or allergens. Allergy note reviewed and planning to change to a different biologic.   Denies personal history of cancer or family history of lung cancer. Previously worked at a [employer], Insurance underwriter (wore masks) x 3, [employer]. 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   Syncope and collapse    Tennis elbow    Ulcer    Vertigo      Family History  Problem Relation Age of Onset   COPD Mother    Bronchitis Mother    Arthritis Mother    Depression Mother    Heart disease  Mother    Hypertension Mother    Alcohol abuse Father    Early death Father        GSW   Migraines Sister    Colon cancer Maternal Grandmother    PKU Son    Sleep apnea Neg Hx      Social History   Occupational History   Occupation: disability    Comment: unemployed  Tobacco Use   Smoking status: Never   Smokeless tobacco: Never  Vaping Use   Vaping Use: Never used  Substance and Sexual Activity   Alcohol use: Not Currently    Comment: drink occasionally   Drug use: No   Sexual activity: Not Currently    Partners: Female    Birth control/protection: None    Allergies  Allergen Reactions   Baclofen Shortness Of Breath    Swollen ankles   Duloxetine Hives    Other reaction(s): Other (See Comments) "Hives, forgot who I was"   Penicillins Hives    Has patient had a PCN reaction causing immediate rash, facial/tongue/throat swelling, SOB or lightheadedness with hypotension: Unknown Has patient had a PCN reaction causing severe rash involving mucus membranes or skin necrosis: Yes Has patient had a PCN reaction that required hospitalization: Unknown Has patient had a PCN reaction occurring within the last 10 years:  Unknown If all of the above answers are "NO", then may proceed with Cephalosporin use. Other reaction(s): Other (See Comments) Has patient had a PCN reaction causing immediate rash, facial/tongue/throat swelling, SOB or lightheadedness with hypotension: Unknown Has patient had a PCN reaction causing severe rash involving mucus membranes or skin necrosis: Yes Has patient had a PCN reaction that required hospitalization: Unknown Has patient had a PCN reaction occurring within the last 10 years: Unknown If all of the above answers are "NO", then may proceed with Cephalosporin use.   Sulfa Antibiotics Hives, Nausea And Vomiting and Other (See Comments)   Benadryl [Diphenhydramine]     Started throwing up   Gluten Meal    Penicillin V Potassium Other (See Comments)    Claritin [Loratadine] Other (See Comments)    shaking   Duloxetine Hcl Hives and Other (See Comments)    "Hives, forgot who I was"   Ultram [Tramadol] Other (See Comments)    Dizzy     Outpatient Medications Prior to Visit  Medication Sig Dispense Refill   albuterol (PROVENTIL) (2.5 MG/3ML) 0.083% nebulizer solution Take 3 mLs (2.5 mg total) by nebulization every 4 (four) hours as needed for wheezing or shortness of breath. 150 mL 1   albuterol (VENTOLIN HFA) 108 (90 Base) MCG/ACT inhaler Inhale two puffs every 4-6 hours if needed for cough or wheeze. 18 g 1   botulinum toxin Type A (BOTOX) 200 units injection Provider to inject 155 units into the muscles of the head and neck every 12 weeks. Discard remainder. 1 each 1   butalbital-apap-caffeine-codeine (FIORICET WITH CODEINE) 50-325-40-30 MG capsule Take 1 capsule by mouth every 4 (four) hours as needed for headache.     chlorthalidone (HYGROTON) 25 MG tablet Take 25 mg by mouth every morning.     doxycycline (VIBRA-TABS) 100 MG tablet Take 100 mg by mouth 2 (two) times daily.     dupilumab (DUPIXENT) 300 MG/2ML prefilled syringe INJECT '300MG'$  SUBCUTANEOUSLY  EVERY OTHER WEEK 4 mL 11   EPINEPHrine 0.3 mg/0.3 mL IJ SOAJ injection Inject 0.3 mg into the muscle as needed for anaphylaxis. 1 each 1   escitalopram (LEXAPRO) 20 MG tablet Take 1 tablet (20 mg total) by mouth daily. 90 tablet 0   famotidine (PEPCID) 20 MG tablet Take 20 mg by mouth at bedtime.     hydrOXYzine (ATARAX) 50 MG tablet Take 50 mg by mouth at bedtime as needed.     ibuprofen (ADVIL) 400 MG tablet Take 400 mg by mouth every 8 (eight) hours as needed.     lamoTRIgine (LAMICTAL) 100 MG tablet Take 100 mg by mouth 2 (two) times daily.     levETIRAcetam (KEPPRA) 500 MG tablet TAKE 1 TABLET BY MOUTH TWICE DAILY 180 tablet 3   levocetirizine (XYZAL) 5 MG tablet TAKE 1 TABLET BY MOUTH ONCE DAILY IN THE EVENING 30 tablet 5   linaclotide (LINZESS) 290 MCG CAPS capsule TAKE 1  CAPSULE BY MOUTH ONCE DAILY 30  MINUTES  BEFORE  BREAKFAST 90 capsule 3   metaxalone (SKELAXIN) 800 MG tablet Take 800 mg by mouth 3 (three) times daily.     methocarbamol (ROBAXIN) 500 MG tablet Take 500 mg by mouth 3 (three) times daily as needed.     metoprolol succinate (TOPROL-XL) 25 MG 24 hr tablet Take 1 tablet (25 mg total) by mouth daily. 90 tablet 3   mirtazapine (REMERON) 45 MG tablet Take 1 tablet (45 mg total) by mouth at bedtime. 90 tablet 0  Olopatadine HCl (PATADAY) 0.2 % SOLN Place 1 drop each eye once a day as needed for itchy watery eyes 2.5 mL 5   omeprazole (PRILOSEC) 40 MG capsule TAKE 1 CAPSULE BY MOUTH ONCE DAILY BEFORE BREAKFAST 30 capsule 5   ondansetron (ZOFRAN) 8 MG tablet 1 tablet as needed for nausea Orally every 8 hours as needed for 14 days     oxyCODONE-acetaminophen (PERCOCET) 10-325 MG tablet Take 1 tablet by mouth every 6 (six) hours as needed.     rizatriptan (MAXALT) 10 MG tablet      telmisartan (MICARDIS) 40 MG tablet Take 40 mg by mouth daily.     TRELEGY ELLIPTA 200-62.5-25 MCG/ACT AEPB Inhale 1 puff by mouth once daily 60 each 0   triamcinolone ointment (KENALOG) 0.1 % APPLY TOPICALLY TWICE DAILY 454 g 0   budesonide-formoterol (SYMBICORT) 160-4.5 MCG/ACT inhaler Inhale 2 puffs into the lungs daily.     montelukast (SINGULAIR) 10 MG tablet TAKE 1 TABLET BY MOUTH AT BEDTIME (Patient not taking: Reported on 04/12/2022) 30 tablet 5   potassium chloride 20 MEQ/15ML (10%) SOLN SMARTSIG:15 By Mouth 1-2 Times Daily (Patient not taking: Reported on 04/12/2022)     No facility-administered medications prior to visit.    Review of Systems  Constitutional:  Negative for chills, diaphoresis, fever, malaise/fatigue and weight loss.  HENT:  Negative for congestion.   Respiratory:  Positive for shortness of breath. Negative for cough, hemoptysis, sputum production and wheezing.   Cardiovascular:  Negative for chest pain, palpitations and leg swelling.      Objective:   Vitals:   04/12/22 1408  BP: 118/80  Pulse: 79  SpO2: 98%  Weight: 141 lb 3.2 oz (64 kg)  Height: '5\' 4"'$  (1.626 m)   SpO2: 98 % O2 Device: None (Room air)  Physical Exam: General: Well-appearing, no acute distress HENT: [place], AT Eyes: EOMI, no scleral icterus Respiratory: Clear to auscultation bilaterally.  No crackles, wheezing or rales Cardiovascular: RRR, -M/R/G, no JVD Extremities:-Edema,-tenderness Neuro: AAO x4, CNII-XII grossly intact Psych: Normal mood, normal affect  Data Reviewed:  Imaging: CT Chest 11/28/21 (report only) - 7 mm ill-defined pleural based nodular density along lateral left lung base CXR 03/13/22 - No infiltrate effusion or edema  PFT: Spirometry 12/28/21 FVC 3.10 (92%) FEV1 2.57 (96%) Ratio 83   Interpretation: Normal spirometry  Spirometry 03/13/22 FVC 3.24 (104%) FEV1 2.54 (102%) Ratio 78   Interpretation: Normal spirometry  Labs: CBC    Component Value Date/Time   WBC 7.2 08/24/2021 1405   RBC 4.41 08/24/2021 1405   HGB 14.5 08/24/2021 1405   HGB 14.0 05/13/2019 1531   HCT 41.8 08/24/2021 1405   HCT 38.9 05/13/2019 1531   PLT 391 08/24/2021 1405   PLT 427 05/13/2019 1531   MCV 94.8 08/24/2021 1405   MCV 92 05/13/2019 1531   MCH 32.9 08/24/2021 1405   MCHC 34.7 08/24/2021 1405   RDW 11.9 08/24/2021 1405   RDW 12.1 05/13/2019 1531   LYMPHSABS 2.6 05/13/2019 1531   MONOABS 0.5 08/09/2016 0956   EOSABS 0.2 05/13/2019 1531   BASOSABS 0.1 05/13/2019 1531   Absolute eos 05/13/19 - 200     Assessment & Plan:   Discussion: 52 year old female with COPD/asthma, seasonal allergies, fibromyalgia, GAD, depression, reflex, migraines, RLS, DDD, HTN, seizures who for evaluation for asthma and lung nodule. Reviewed history and prior CT report. Discussed asthma symptoms which are being managed by Allergy. Agree with transitioning to new biologic.  LLL lung nodule  ORDER CT Chest without contrast in March 2024  Severe  persistent asthma CONTINUE Trelegy CONTINUE Albuterol as needed CONTINUE nebulizer as needed CONTINUE Dupixent until Allergy has changed your biologic  Health Maintenance Immunization History  Administered Date(s) Administered   Influenza,inj,Quad PF,6+ Mos 11/12/2016   Moderna Sars-Covid-2 Vaccination 07/20/2019, 08/24/2019   Tdap 11/12/2016   CT Lung Screen - never smoker. Not qualified  No orders of the defined types were placed in this encounter. No orders of the defined types were placed in this encounter.   Return in about 2 months (around 06/11/2022).  I have spent a total time of 45-minutes on the day of the appointment reviewing prior documentation, coordinating care and discussing medical diagnosis and plan with the patient/family. Imaging, labs and tests included in this note have been reviewed and interpreted independently by me.  [place], MD [facility] 04/12/2022 2:44 PM  Office Number [phone]

## 2022-04-12 NOTE — Patient Instructions (Addendum)
LLL lung nodule  ORDER CT Chest without contrast in March 2024 (after 13th)  Severe persistent asthma CONTINUE Trelegy CONTINUE Albuterol as needed CONTINUE nebulizer as needed CONTINUE Dupixent until Allergy has changed your biologic  Follow-up with me end of March

## 2022-04-15 ENCOUNTER — Encounter (HOSPITAL_BASED_OUTPATIENT_CLINIC_OR_DEPARTMENT_OTHER): Payer: Self-pay | Admitting: Pulmonary Disease

## 2022-04-15 ENCOUNTER — Other Ambulatory Visit: Payer: Self-pay

## 2022-04-15 NOTE — Telephone Encounter (Signed)
Checking on this, pt is scheduled for next week. Thank you!

## 2022-04-16 ENCOUNTER — Other Ambulatory Visit (HOSPITAL_COMMUNITY): Payer: Self-pay

## 2022-04-17 ENCOUNTER — Encounter (INDEPENDENT_AMBULATORY_CARE_PROVIDER_SITE_OTHER): Payer: Self-pay

## 2022-04-17 ENCOUNTER — Other Ambulatory Visit (HOSPITAL_COMMUNITY): Payer: Self-pay

## 2022-04-17 MED ORDER — BOTOX 200 UNITS IJ SOLR
INTRAMUSCULAR | 1 refills | Status: DC
  Filled 2022-04-17: qty 1, fill #0
  Filled 2022-07-04: qty 1, 84d supply, fill #0

## 2022-04-17 NOTE — Telephone Encounter (Signed)
Can you please send Rx to [facility]? Thank you!

## 2022-04-17 NOTE — Telephone Encounter (Signed)
BOTOX ONE-Benefit Verification [id number] Submitted!

## 2022-04-17 NOTE — Addendum Note (Signed)
Addended by: Gildardo Griffes on: 04/17/2022 03:32 PM   Modules accepted: Orders

## 2022-04-17 NOTE — Telephone Encounter (Signed)
Botox refill sent to [facility].

## 2022-04-17 NOTE — Telephone Encounter (Signed)
Pharmacy Patient Advocate Encounter   Received notification from [place] that prior authorization for Botox 200UNIT solution is required/requested.    PA submitted on 04/17/2022 to (ins) [employer] via CoverMyMeds [place] Status pending  Submitted as an urgent request

## 2022-04-17 NOTE — Telephone Encounter (Signed)
Pharmacy Patient Advocate Encounter  Prior Authorization for Botox 200UNIT solution has been approved.    PA# PA Case ID: [id number] Effective dates: 04/17/2022 through 07/16/2022 Per test billing PT copay is zero. This can be filled at [facility]

## 2022-04-18 ENCOUNTER — Other Ambulatory Visit (HOSPITAL_COMMUNITY): Payer: Self-pay

## 2022-04-22 ENCOUNTER — Ambulatory Visit: Payer: Medicare Other | Admitting: Neurology

## 2022-04-22 ENCOUNTER — Ambulatory Visit (INDEPENDENT_AMBULATORY_CARE_PROVIDER_SITE_OTHER): Payer: 59 | Admitting: Neurology

## 2022-04-22 VITALS — BP 107/75 | HR 87 | Ht 64.0 in | Wt 139.0 lb

## 2022-04-22 DIAGNOSIS — G43719 Chronic migraine without aura, intractable, without status migrainosus: Secondary | ICD-10-CM | POA: Diagnosis not present

## 2022-04-22 MED ORDER — ONABOTULINUMTOXINA 200 UNITS IJ SOLR
155.0000 [IU] | Freq: Once | INTRAMUSCULAR | Status: AC
  Administered 2022-04-22: 155 [IU] via INTRAMUSCULAR

## 2022-04-22 NOTE — Progress Notes (Deleted)
Subjective:    Patient ID: Claire [name] is a 52 y.o. female.  HPI {Common ambulatory SmartLinks:[id number]}  Review of Systems  Neurological:        Botox     Botox- 200 units x 1 vial Lot: [id number] Expiration: 08/2024 NDC: 2552-5894-83  Bacteriostatic 0.9% Sodium Chloride- 2.2 mL total Lot: [id number] Expiration: 11/25 NDC: 46002-984-73  Dx: G85.694 S/P     Objective:  Neurological Exam  Physical Exam  Assessment:   ***  Plan:   ***

## 2022-04-22 NOTE — Patient Instructions (Signed)
Please remember, botulinum toxin takes about 3-7 days to kick in. As discussed, this is not a pain shot. The purpose of the injections is to gradually improve your symptoms. In some patients it takes up to 2-3 weeks to make a difference and it wears off with time. Sometimes it may wear off before it is time for the next injection. We still should wait till the next 3 monthly injection, because injecting too frequently may cause you to develop immunity to the botulinum toxin. We are looking for a reduction in the severity and/or frequency of your symptoms. As a reminder, side effects to look out for are (but not limited to): mouth dryness, dryness of the eyes, heaviness of your head or muscle weakness, including droopy face or droopy eyelid(s), rarely: speech or swallowing difficulties and very rarely: breathing difficulties. Some people have transient neck pain or soreness which typically responds to over-the-counter anti-inflammatory medication and local heat application with a heat pad. If you think you have a severe reaction to the botulinum toxin, such as weakness, trouble speaking, trouble breathing, or trouble swallowing, you have to call 911 or have someone take you to the nearest emergency room. However, most people have either no or minimal side effects from the injections. It is normal to have a little bit of redness and swelling around the injection sites which usually improves after a few hours. Rarely, there may be a bruise that improves on its own. Most side effects reported are very mild and resolve within 10-14 days. Please feel free to call us if you have any additional questions or concerns: [phone] or email us through My Chart. We may have to adjust the dose over time, depending on your results from this injection and your overall response over time to this medication.   

## 2022-04-22 NOTE — Progress Notes (Signed)
Claire [name] is a 52 year old right-handed woman with an underlying medical history of hypertension, migraine headaches, fibromyalgia, history of seizure-like episodes, carpal tunnel syndrome, anxiety, depression, vertigo, arthritis, asthma, allergies, NASH, hypokalemia, status post back surgery, left ureter surgery, right shoulder surgery, carpal tunnel surgery, and gallbladder surgery, who presents for  her second Botox injection for intractable migraine headaches.  The patient is unaccompanied today.  She had her initial injection on 01/28/2022, at which time she received a total dose of 155 units of Botox as per migraine injection protocol.     Written informed consent for recurrent, 3 monthly intramuscular injections with botulinum toxin for this indication has been obtained. I have previously talked to the patient in detail about expectations, limitations, benefits as well as potential adverse effects of botulinum toxin injections. The patient understands that the side effects include (but are not limited to): Mouth dryness, dryness of eyes, speech and swallowing difficulties, respiratory depression or problems breathing, weakness of muscles including more distant muscles than the ones injected, flu-like symptoms, myalgias, injection site reactions such as redness, itching, swelling, pain, and infection.   200 units of botulinum toxin type A were reconstituted using preservative-free normal saline to a concentration of 10 units per 0.1 mL and drawn up into 1 mL tuberculin syringes.    Botox- 200 units x 1 vial, spec pharm Lot: [id number] Expiration: 08/2024 NDC: 6962-9528-41   Bacteriostatic 0.9% Sodium Chloride- 2.2 mL total Lot: [id number] Expiration: 11/25 NDC: 27253-664-40   Today, 04/22/2022: She reports doing, better, Botox helped, especially with her neck muscle tension and migraines triggered by neck pain.  She had no side effects.   O/E: BP 107/75   Pulse 87   Ht '5\' 4"'$  (1.626 m)   Wt 139  lb (63 kg)   BMI 23.86 kg/m     The patient was situated in a chair, sitting comfortably. After preparing the areas with 70% isopropyl alcohol and using a 26 gauge 1 1/2 inch hollow lumen recording EMG needle for the neck injections as well as a 30 gauge 1 inch needle for the facial injections, a total dose of 155 units of botulinum toxin type A in the form of Botox was injected into the muscles and the following distribution and quantities:   #1: 10 units on the right and 10 units in the left frontalis muscles, broken down in 2 sites on each side. #2: 5 units in the right and 5 units in the left corrugator muscles. #3: 15 units in the right and 15 units in the left occipitalis muscles, broken down in 3 sites on each side. #4: 20 units in the right and 20 units in the left temporalis muscles, broken down in 4 sites on each side.  #5: 15 units on the right and 15 units in the left upper trapezius muscles, broken down in 3 sites on each side. #6: 10 units in the right and 10 units in the left splenius capitis muscles, broken down in 2 sites on each side.  #7: 2.5 units in the right and 2.5 units in the left procerus muscles.   EMG guidance was utilized for the neck injections with mild to moderatae EMG activity noted, especially in the b/l splenius capitis muscles.  A dose of 45 units out of a total dose of 200 units was discarded as unavoidable waste.   The patient tolerated the procedure well without immediate complications. She was advised to make a followup appointment for repeat injections in 3 months  from now and encouraged to call us with any interim questions, concerns, problems, or updates. She was in agreement and did not have any questions prior to leaving clinic today.   Previously:   I saw her on 01/08/2022.  Please refer to that encounter for details of her history and examination and medications tried and failed.    She saw Butler Denmark, NP in follow-up on 01/24/2022 for her  seizure-like events.  She was kept on Keppra.   01/08/22: reports a longstanding history of migraine headaches.  She reports that she has had migraines since her early 75s.  Her sister has migraines.  I reviewed your office note from 10/22/2021.   She had seen Dr. Jannifer Franklin and nurse practitioner Butler Denmark for seizure-like episodes.  She has not been seen in this clinic for over 1 year.  She was last seen by Butler Denmark, NP on 08/24/2020 and I reviewed the note, I copied the note below for reference.  She was last seen by Dr. Margette Fast on 08/24/2019 and I reviewed the note.  I copied the note below for reference.   She reports a history of seizures, is not clear on what type of seizures she has.  She has not had any seizure-like events in about 2 years, she believes that the Blawnox has been helpful.   She is currently on multiple medications including centrally acting medications. However, she does not take all the medications that are listed on your list, she is not sure about the exact list of her medications, she is also not sure as to what medications she has tried for migraines.  She has been prescribed Maxalt but reports that it did not help.  She is no longer on Fioricet, she is currently on levetiracetam 500 mg twice daily, no longer on Norco 10 mg strength, she takes oxycodone 5 mg strength 3 times a day as needed, she is still on hydroxyzine as I understand, she is no longer on metaxalone, she is on Savella, she is taking Robaxin as well.  She takes Lamictal, she believes it is 100 mg once daily.   She reports that her sister takes Topamax.  She also is not sure if she herself tolerated normal.  She has never been on Botox injections.  She would be willing to consider Botox injections and we talked about the injections, potential side effects and she was also given additional patient information.  She is willing to proceed with the authorization for Botox injections for migraines.  Her migraines  are often generalized, she has associated light sensitivity, sometimes she gets blurry vision with them.  She had an eye examination last year, none this year, she is encouraged to make an appointment for this year.  She has prescription bifocal eyeglasses.   She denies any sudden onset of one-sided weakness or numbness or tingling.   She had a brain MRI in 2014 which was benign.  She would be willing to proceed with a brain MRI.  She does not smoke, she does not drink any alcohol, she does not drink caffeine daily.  She hydrates with water but drinks about 70 ounces of water per day, she has a history of low potassium.  She had blood work through your office on 10/22/2021 and I reviewed the results: BUN was 17, creatinine 0.88, potassium borderline elevated at 6.1, calcium slightly elevated at 10.9, alk phos 92, AST 13, ALT 13.  CBC with differential and platelets showed borderline elevated hemoglobin  at 15.8, hematocrit borderline elevated at 45.9.  TSH normal at 0.53.  Lipid panel benign.   She has over 15 headache days per month, migraine attacks are about 4-5 times a week.   I had evaluated her about 2-1/2 years ago for sleep apnea concern.  A sleep study in April 2021 did not show any significant obstructive sleep apnea.     08/24/2020 (SS): <<Ms. Bova is a 52 year old female with history of fibromyalgia and seizure-like episodes.  Is on Keppra.  Seizure-like events are described as confusion, amnesia for 30 to 60 minutes.  No further spells in the last year.  Denies any side effects of Keppra.  Sees pain management, for fibro and chronic low back pain.  Uses a cane to ambulate.  Lives alone, drives a car, lives in [place].  Had left rotator cuff surgery in April, remains in PT.  Has had several GI illnesses this year, has lost 20 pounds.  Here today for evaluation unaccompanied.>>   08/24/2019 (Dr. Jannifer Franklin): <<Ms. Servello is a 52 year old left-handed [place] female with a history of fibromyalgia and  seizure-like episodes in the past.  The patient was last seen through this office in July 2018 for this reason.  The patient was on Keppra and had a good response to the medication, she had not had any events while on the medication.  It appears that she stopped the medication when she had no insurance and could not afford the drug and then began having seizure type events again.  Last such event was 2 weeks ago.  The patient will have events where she becomes confused, she has amnesia for 30 to 60 minutes.  She does not have generalized jerking or tongue biting or bowel and bladder incontinence.  The patient is still operating a motor vehicle apparently.  She is followed through a pain center for her fibromyalgia.  She is having troubles with gait instability, she has chronic low back pain, she uses a cane to get around and she will occasionally fall.  She does not drink alcohol.  She comes to this office for an evaluation.>> 07/13/19 (SA): Ms. Royer is a 52 year old left-handed woman with an underlying medical history of asthma, hypertension, migraine, fibromyalgia and blackout spells, suspected seizures on Keppra (for which she has seen Dr. Jannifer Franklin), allergies, acid reflux, degenerative disc disease, depression, anxiety, history of syncope, PTSD by chart review, palpitations, vertigo, and overweight state, who Presents for follow-up consultation of her sleep disturbance, after interim sleep testing.  The patient is unaccompanied today.  I first met her on 06/09/2019 at the request of her allergy and asthma specialist, at which time the patient reported snoring and excessive daytime somnolence.  She had trouble going to sleep and staying asleep.  She was advised to proceed with a sleep study.  She had a baseline sleep study on 06/24/2019, which showed a sleep efficiency of 94.6%, sleep was primarily light stage sleep with over 90% of stage II sleep and absence of REM sleep.  Her total AHI was 1.4/h, O2 nadir was 84%,  average oxygen saturation was 90%.  She had no significant PLM's.  Mild and intermittent snoring was noted. She was advised regarding her test results by phone call.  She requested a follow-up appointment to discuss findings in more detail.   She reports no new symptoms, as far as her sleep is concerned.  She reports that at the time of her sleep study she fell asleep much quicker than usual at  home.  She did not feel fully rested, she did not think she slept nearly as much as the sleep study indicated.  We talked about her sleep study results in detail.  She feels that the BuSpar is not as effective for her anxiety.  She has an appointment with her psychiatrist/behavioral health coming up.  She has also made a follow-up appointment with Dr. Jannifer Franklin.     06/09/19 (SA): (She) reports snoring and excessive daytime somnolence.  I reviewed the office note from 05/13/2019.  Her Epworth sleepiness score is 0/24, Fatigue severity score is 62 out of 63.  She reports trouble going to sleep and staying asleep.  She has tried melatonin over-the-counter which did not help.  She does snore.  She is single and lives alone but has been told by a cousin that she had pauses in her breathing when she was witnessed to sleep on the couch one time.  The patient denies any recurrent morning headaches but does have recurrent headaches she reports.  She has not made a follow-up appointment with Dr. Jannifer Franklin but would like to make one on her way out today.  She reports increase in stress.  She reports not being able to stay asleep.  She has no obvious family history of sleep apnea as she recalls.  She does not have a TV in her bedroom but sometimes does look at her phone at night.  She has a bedtime between 11 and midnight and rise time generally between 6 and 7.  She is a non-smoker and does not drink alcohol.  She drinks very little caffeine, approximately 1 soda per week.

## 2022-04-26 ENCOUNTER — Ambulatory Visit: Payer: Medicare Other | Admitting: Allergy & Immunology

## 2022-04-29 NOTE — Addendum Note (Signed)
Addended by: Rodman Pickle on: 04/29/2022 01:15 PM   Modules accepted: Orders

## 2022-05-14 NOTE — Patient Instructions (Incomplete)
Severe persistent asthma  7 mm ill-defined pleural-based nodular density along the lateral  left lung base seen on CT chest without contrast on 11/28/21. Will refer to pulmonology due to nodular density and poorly controlled asthma Stop Arnuity 200 mcg one puff daily Consider switching to a different asthma biologic We will get lab work for severe to treat asthma. Will order a STAT chest x-ray due to the pain in your chest. We will call you with results once it is back. - Daily controller medication(s): Trelegy 200/62.5/25 one puff once daily in the morning AND Dupixent '300mg'$  every two weeks - Prior to physical activity: albuterol 2 puffs 10-15 minutes before physical activity. - Rescue medications: albuterol 4 puffs every 4-6 hours as needed - Asthma control goals:  * Full participation in all desired activities (may need albuterol before activity) * Albuterol use two time or less a week on average (not counting use with activity) * Cough interfering with sleep two time or less a month * Oral steroids no more than once a year * No hospitalizations  2. Allergic rhinitis (cat dander, tree pollen, weed pollen, grass, and dog) - Continue Xyzal '5mg'$  to twice daily. - Continue Pataday one drop per eye daily. - Consider allergy shots for long term control, but this would require weekly visits to the office for a period of time.   3. 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 - Continue to avoid all of these items above.  4. 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 - EpiPen up to date  5. Rash- 90% better -Continue triamcinolone using 1 application sparingly twice a day as needed - Continue Xyzal (levocetirizine) 5 mg twice a day as needed for itching - If this continues to be a problem we can consider lab work   Recommend scheduling an appointment with your cardiologist due to frequent chest pain and  palpitations 6.Schedule a follow up appointment in 4 weeks with Dr. Ernst Bowler or sooner if needed

## 2022-05-14 NOTE — Progress Notes (Deleted)
   [place], [place] [address] Dept: [phone]  FOLLOW UP NOTE  Patient ID: Claire [name], female    DOB: 1970-05-15  Age: 52 y.o. MRN: [id number] Date of Office Visit: 05/15/2022  Assessment  Chief Complaint: No chief complaint on file.  HPI Claire [name] is a 52 year old female who presents to the clinic for follow-up visit.  She was last seen in this clinic on 03/13/2022 by Althea Charon, FNP, for evaluation of asthma, allergic rhinitis, rash, food allergy, and insect sting allergy.  In the interim, she did see Dr. Loanne Drilling, pulmonology specialist, on 04/12/2022.   After reviewing epic it shows that she had a CT chest on November 28, 2021 showing: "1. 7 mm ill-defined pleural-based nodular density along the lateral  left lung base. Per Fleischner Society Guidelines, recommend a  non-contrast Chest CT at 6-12 months. If patient is high risk for  malignancy, recommend an additional non-contrast Chest CT at 18-24  months; if patient is low risk for malignancy a non-contrast Chest  CT at 18-24 months is optional. These guidelines do not apply to  immunocompromised patients and patients with cancer. Follow up in  patients with significant comorbidities as clinically warranted. For  lung cancer screening, adhere to Lung-RADS guidelines. Reference:  Radiology. 2017; 284(1):228-43.  2. Cholecystectomy.     Drug Allergies:  Allergies  Allergen Reactions   Baclofen Shortness Of Breath    Swollen ankles   Duloxetine Hives    Other reaction(s): Other (See Comments) "Hives, forgot who I was"   Penicillins Hives    Has patient had a PCN reaction causing immediate rash, facial/tongue/throat swelling, SOB or lightheadedness with hypotension: Unknown Has patient had a PCN reaction causing severe rash involving mucus membranes or skin necrosis: Yes Has patient had a PCN reaction that required hospitalization: Unknown Has patient had a PCN reaction occurring within  the last 10 years: Unknown If all of the above answers are "NO", then may proceed with Cephalosporin use. Other reaction(s): Other (See Comments) Has patient had a PCN reaction causing immediate rash, facial/tongue/throat swelling, SOB or lightheadedness with hypotension: Unknown Has patient had a PCN reaction causing severe rash involving mucus membranes or skin necrosis: Yes Has patient had a PCN reaction that required hospitalization: Unknown Has patient had a PCN reaction occurring within the last 10 years: Unknown If all of the above answers are "NO", then may proceed with Cephalosporin use.   Sulfa Antibiotics Hives, Nausea And Vomiting and Other (See Comments)   Benadryl [Diphenhydramine]     Started throwing up   Gluten Meal    Penicillin V Potassium Other (See Comments)   Claritin [Loratadine] Other (See Comments)    shaking   Duloxetine Hcl Hives and Other (See Comments)    "Hives, forgot who I was"   Ultram [Tramadol] Other (See Comments)    Dizzy    Physical Exam: There were no vitals taken for this visit.   Physical Exam  Diagnostics:    Assessment and Plan: No diagnosis found.  No orders of the defined types were placed in this encounter.   There are no Patient Instructions on file for this visit.  No follow-ups on file.    Thank you for the opportunity to care for this patient.  Please do not hesitate to contact me with questions.  Gareth Morgan, FNP [facility]

## 2022-05-15 ENCOUNTER — Ambulatory Visit: Payer: 59 | Admitting: Family Medicine

## 2022-05-18 ENCOUNTER — Other Ambulatory Visit: Payer: Self-pay | Admitting: Allergy & Immunology

## 2022-05-22 ENCOUNTER — Other Ambulatory Visit: Payer: Self-pay

## 2022-05-22 ENCOUNTER — Encounter: Payer: Self-pay | Admitting: Allergy & Immunology

## 2022-05-22 ENCOUNTER — Ambulatory Visit (INDEPENDENT_AMBULATORY_CARE_PROVIDER_SITE_OTHER): Payer: 59 | Admitting: Allergy & Immunology

## 2022-05-22 VITALS — BP 112/70 | HR 99 | Temp 97.9°F | Resp 20 | Ht 64.0 in | Wt 145.6 lb

## 2022-05-22 DIAGNOSIS — T63481D Toxic effect of venom of other arthropod, accidental (unintentional), subsequent encounter: Secondary | ICD-10-CM | POA: Diagnosis not present

## 2022-05-22 DIAGNOSIS — J4550 Severe persistent asthma, uncomplicated: Secondary | ICD-10-CM | POA: Diagnosis not present

## 2022-05-22 DIAGNOSIS — R911 Solitary pulmonary nodule: Secondary | ICD-10-CM

## 2022-05-22 DIAGNOSIS — T7800XD Anaphylactic reaction due to unspecified food, subsequent encounter: Secondary | ICD-10-CM | POA: Diagnosis not present

## 2022-05-22 DIAGNOSIS — G8929 Other chronic pain: Secondary | ICD-10-CM

## 2022-05-22 DIAGNOSIS — J3089 Other allergic rhinitis: Secondary | ICD-10-CM

## 2022-05-22 DIAGNOSIS — T7800XA Anaphylactic reaction due to unspecified food, initial encounter: Secondary | ICD-10-CM

## 2022-05-22 NOTE — Patient Instructions (Addendum)
Severe persistent asthma  - Lung testing looks excellent today. - We are not going to make any changes today.  - Daily controller medication(s): Trelegy 200/62.5/25 one puff once daily in the morning AND Dupixent '300mg'$  every two weeks - Prior to physical activity: albuterol 2 puffs 10-15 minutes before physical activity. - Rescue medications: albuterol 4 puffs every 4-6 hours as needed - Asthma control goals:  * Full participation in all desired activities (may need albuterol before activity) * Albuterol use two time or less a week on average (not counting use with activity) * Cough interfering with sleep two time or less a month * Oral steroids no more than once a year * No hospitalizations  2. Allergic rhinitis (cat dander, tree pollen, weed pollen, grass, and dog) - Continue Xyzal '5mg'$  to twice daily. - Continue Pataday one drop per eye daily. - Consider allergy shots for long term control, but this would require weekly visits to the office for a period of time.   3. 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 - Continue to avoid all of these items above.  4. 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 - EpiPen up to date  5. Rash - 90% better - Continue triamcinolone using 1 application sparingly twice a day as needed - Continue Xyzal (levocetirizine) 5 mg twice a day as needed for itching - If this continues to be a problem we can consider lab work   6. Chronic pain - We are going to refer you to see Dr. Hardin Negus for pain control. - He is actually located in the same place as our [place] office.   7. Return in about 6 months (around 11/22/2022).    Please inform us of any Emergency Department visits, hospitalizations, or changes in symptoms. Call us before going to the ED for breathing or allergy symptoms since we might be able to fit you in for a sick visit. Feel free to contact us anytime  with any questions, problems, or concerns.  It was a pleasure to see you again today!  Websites that have reliable patient information: 1. American Academy of Asthma, Allergy, and Immunology: www.aaaai.org 2. Food Allergy Research and Education (FARE): foodallergy.org 3. Mothers of Asthmatics: http://www.asthmacommunitynetwork.org 4. American College of Allergy, Asthma, and Immunology: www.acaai.org   COVID-19 Vaccine Information can be found at: ShippingScam.co.uk For questions related to vaccine distribution or appointments, please email [email or web address] or call [phone].   We realize that you might be concerned about having an allergic reaction to the COVID19 vaccines. To help with that concern, WE ARE OFFERING THE COVID19 VACCINES IN OUR OFFICE! Ask the front desk for dates!     "Like" [place] on Facebook and Instagram for our latest updates!      A healthy democracy works best when [employer] participate! Make sure you are registered to vote! If you have moved or changed any of your contact information, you will need to get this updated before voting!  In some cases, you MAY be able to register to vote online: [email or web address]

## 2022-05-22 NOTE — Progress Notes (Unsigned)
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 ELoanne Drilling  Complicated past medical history including fibromyalgia   RDenetricehas overall done very well.  Removal of the cats from her bedroom seems to have done the trick to help with her breathing issues.  She has had a thorough workup for issues such as pulmonary embolisms and cardiac issues, so I do not think we need to do any further workup.  She is going to remain on the Trelegy 1 puff once daily and Dupixent every 2 weeks.  We are going to refer her to Physical Medicine and Rehabilitation for a pain consult.  Plan/Recommendations:   Severe persistent asthma  - Lung testing looks excellent today. - We are not going to make any changes today.  - Daily controller medication(s): Trelegy 200/62.5/25 one puff once daily in the morning AND Dupixent '300mg'$  every two weeks - Prior to physical activity: albuterol 2 puffs 10-15 minutes before physical activity. - Rescue medications: albuterol 4 puffs every 4-6 hours as needed - Asthma control goals:  * Full participation in all desired activities (may need albuterol before activity) * Albuterol use two time or less a week on average (not counting use with activity) * Cough interfering with sleep two time or less a month * Oral steroids no more than once a year * No hospitalizations  2. Allergic rhinitis (cat dander, tree pollen, weed pollen, grass, and dog) - Continue Xyzal '5mg'$  to twice daily. - Continue Pataday one drop per eye daily. - Consider allergy shots for long term control, but this would require weekly visits to the office for a period of time.   3. Anaphylaxis due to food (wheat, rye,  barley, oat) - with current avoidance  - EpiPen 0.3 mg refilled today. - Continue to avoid all of these items above.  4. 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 - EpiPen up to date  5. Rash - 90% better - Continue triamcinolone using 1 application sparingly twice a day as needed - Continue Xyzal (levocetirizine) 5 mg twice a day as needed for itching - If this continues to be a problem we can consider lab work   6. Chronic pain - We are going to refer you to see Dr. PHardin Negusfor pain control. - He is actually located in the [place] place [place] [facility].   7. Return in about 6 months (around 11/22/2022).    Subjective:   Claire BOUFFARDis a 52y.o. female presenting today for follow up of  Chief Complaint  Patient presents with   Follow-up    Claire SLECHTAhas a history of the following: Patient Active Problem List   Diagnosis Date Noted   Lung nodule 04/12/2022   Severe persistent asthma without complication [id number]  PTSD (post-traumatic stress disorder) [id number]  Eosinophilic esophagitis [id number]  H. 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  Claire [name] is a 52 y.o. female presenting for a follow up visit.  She was last seen in December 2023.  At that time, we referred her to pulmonology for concern of nodular density along the left lateral base of her chest.  We stopped the Arnuity and started Trelegy 1 puff once daily at the 200 mcg dose.  We continue with albuterol as needed.   For the allergic rhinitis, we continue with levocetirizine as well as Pataday.  We also talked about allergy shots.  He continues to avoid wheat, rye, barley, and oat.  Her EpiPen is up-to-date.  She continue to avoid stinging insects.  She had a rash that was 90% better with triamcinolone and levocetirizine.  We did recommend a cardiology visit due to her frequent chest pain.  Since last visit, she has done well.   Asthma/Respiratory Symptom History: She has been getting the cats out of her face which has helped. She has two of her own cats and three cats that "adopted" her. She has been trying to get rid of the three cats that are not hers. This has proven difficult to be sure. She remains on the Trelegy which seems to be working better than the [place]. She was using that at night, but she has not noticed a difference at all. She thinks that the cats were the main trigger for her symptoms.   She is seeing Dr. Loanne Drilling for management of her pulmonary nodule. They are going to watch the progression of it over time. Her next chest CT is scheduled for March 22nd. This is to follow her pulmonary nodule. She sees Dr. Loanne Drilling in [place].   She is looking at changing her pain doctors. She is currently seen by Dr. Ruthann Cancer at [facility]. Apparently this doctor is not helping her and ignores her.   Allergic Rhinitis Symptom History: She remains on her Xyzal as well as her Pataday.  We had talked allergy shots for long-term control, but transportation is always been an issue for her.  Overall, she feels like her symptoms are under good control.  She thinks that her cats in her house were likely the trigger for her symptoms.  She does have a sensitization to cat dander, so she already knew about this allergy.  She decided to kick them out of her bedroom which has helped.  Food Allergy Symptom History: She continues to avoid wheat, rye, barley, and oat.  Her EpiPen is up-to-date.  She has had no  accidental ingestions.  She had testing performed in February 2021 that demonstrated elevated IgE to these foods (wheat 5.34, barley 6.55, rye 5.06, and oat 2.40).    Skin Symptom History: She has a rash that has resolved now with the triamcinolone. She has plenty left at home.  This is controlling her symptoms very well.  The remainder of her medical issues have been very complicated.  She is trying to get back on board with all of her specialists.  She has to see an eye doctor and she has a dentist appointment coming up.  She also is having some issues with her pain management doctor.  She is wondering if there is another person we can send her to.  She has seen Dr. Greta Doom in the past. She has not seen Physical Medicine  and Rehab.   Otherwise, there have been no changes to her past medical history, surgical history, family history, or social history.    Review of Systems  Constitutional: Negative.  Negative for chills, fever, malaise/fatigue and weight loss.  HENT:  Positive for congestion. Negative for ear discharge, ear pain and sinus pain.   Eyes:  Negative for pain, discharge and redness.  Respiratory:  Negative for cough, sputum production, shortness of breath and wheezing.   Cardiovascular: Negative.  Negative for chest pain and palpitations.  Gastrointestinal:  Negative for abdominal pain, constipation, diarrhea, heartburn, nausea and vomiting.  Skin: Negative.  Negative for itching and rash.  Neurological:  Negative for dizziness and headaches.  Endo/Heme/Allergies:  Positive for environmental allergies. Does not bruise/bleed easily.       Objective:   Blood pressure 112/70, pulse 99, temperature 97.9 F (36.6 C), resp. rate 20, height '5\' 4"'$  (1.626 m), weight 145 lb 9.6 oz (66 kg), SpO2 98 %. Body mass index is 24.99 kg/m.    Physical Exam Vitals reviewed.  Constitutional:      Appearance: She is well-developed and underweight.     Comments: Talkative.  HENT:     Head:  Normocephalic and atraumatic.     Right Ear: Tympanic membrane, ear canal and external ear normal.     Left Ear: Tympanic membrane, ear canal and external ear normal.     Nose: No nasal deformity, septal deviation, mucosal edema or rhinorrhea.     Right Turbinates: Enlarged, swollen and pale.     Left Turbinates: Enlarged, swollen and pale.     Right Sinus: No maxillary sinus tenderness or frontal sinus tenderness.     Left Sinus: No maxillary sinus tenderness or frontal sinus tenderness.     Comments: No nasal polyps noted.     Mouth/Throat:     Mouth: Mucous membranes are not pale and not dry.     Pharynx: Uvula midline.  Eyes:     General: Lids are normal. No allergic shiner.       Right eye: No discharge.        Left eye: No discharge.     Conjunctiva/sclera: Conjunctivae normal.     Right eye: Right conjunctiva is not injected. No chemosis.    Left eye: Left conjunctiva is not injected. No chemosis.    Pupils: Pupils are equal, round, and reactive to light.  Cardiovascular:     Rate and Rhythm: Normal rate and regular rhythm.     Heart sounds: Normal heart sounds.  Pulmonary:     Effort: Pulmonary effort is normal. No tachypnea, accessory muscle usage, prolonged expiration or respiratory distress.     Breath sounds: Normal breath sounds. No wheezing, rhonchi or rales.     Comments: Moving air well in all lung fields. No increased work of breathing noted.  Chest:     Chest wall: No tenderness.  Lymphadenopathy:     Cervical: No cervical adenopathy.  Skin:    General: Skin is warm.     Capillary Refill: Capillary refill takes less than 2 seconds.     Coloration: Skin is not pale.     Findings: No abrasion, erythema, petechiae or rash. Rash is not papular, urticarial or vesicular.  Neurological:     Mental Status: She is alert.  Psychiatric:        Behavior: Behavior is cooperative.      Diagnostic studies:    Spirometry: results normal (FEV1: 2.36/89%, FVC: 2.99/89%,  FEV1/FVC:  79%).    Spirometry consistent with normal pattern.   Allergy Studies: none        Salvatore Marvel, MD  [facility]

## 2022-06-02 ENCOUNTER — Other Ambulatory Visit: Payer: Self-pay | Admitting: Allergy & Immunology

## 2022-06-07 ENCOUNTER — Ambulatory Visit (HOSPITAL_COMMUNITY)
Admission: RE | Admit: 2022-06-07 | Discharge: 2022-06-07 | Disposition: A | Discharge status: Home / Self Care | Payer: 59 | Source: Ambulatory Visit | Attending: Pulmonary Disease | Admitting: Pulmonary Disease

## 2022-06-07 DIAGNOSIS — R911 Solitary pulmonary nodule: Secondary | ICD-10-CM | POA: Insufficient documentation

## 2022-06-11 ENCOUNTER — Ambulatory Visit (INDEPENDENT_AMBULATORY_CARE_PROVIDER_SITE_OTHER): Payer: 59 | Admitting: Pulmonary Disease

## 2022-06-11 ENCOUNTER — Encounter (HOSPITAL_BASED_OUTPATIENT_CLINIC_OR_DEPARTMENT_OTHER): Payer: Self-pay | Admitting: Pulmonary Disease

## 2022-06-11 VITALS — BP 106/70 | HR 81 | Temp 97.9°F | Ht 64.0 in | Wt 145.4 lb

## 2022-06-11 DIAGNOSIS — R911 Solitary pulmonary nodule: Secondary | ICD-10-CM

## 2022-06-11 MED ORDER — TRELEGY ELLIPTA 200-62.5-25 MCG/ACT IN AEPB: 1.0000 | INHALATION_SPRAY | Freq: Every day | RESPIRATORY_TRACT | 5 refills | Status: DC

## 2022-06-11 NOTE — Progress Notes (Unsigned)
Subjective:   PATIENT ID: Claire [name] GENDER: female DOB: Nov 24, 1970, MRN: [id number]  Chief Complaint  Patient presents with   Follow-up    Follow up on CT scan. No complaints.     Reason for Visit: Follow-up lung nodule and asthma  [name] is a 52 year old female with COPD/asthma, seasonal allergies, fibromyalgia, GAD, depression, reflex, migraines, RLS, DDD, HTN, seizures who for follow-up for asthma and lung nodule.  Initial consult After working 10 years in a [employer], she reports diagnosis of asthma in her 44s. Currently followed by Allergy and Asthma and is on Trelegy and Dupixent. Using Albuterol 2-3 times a day for shortness of breath. Has used nebulizer 1-3 times a week. Denies wheezing or frequent coughing. Triggered by strong odors or allergens. Allergy note reviewed and planning to change to a different biologic.   Denies personal history of cancer or family history of lung cancer. 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 Currently not on allergy shots due to disability and affordability. Has not needed rescue inhaler recently. She does wake up coughing every other night and needs the fan running. Denies wheezing. Symptoms are improved since being on Dupixent.  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   Syncope and collapse    Tennis elbow    Ulcer    Vertigo      Family History  Problem Relation Age of Onset   COPD Mother    Bronchitis Mother    Arthritis Mother    Depression Mother    Heart disease Mother    Hypertension Mother    Alcohol abuse Father    Early death Father        GSW   Migraines Sister    Colon cancer Maternal Grandmother    PKU Son    Sleep apnea Neg Hx      Social History   Occupational History   Occupation: disability    Comment: unemployed  Tobacco Use   Smoking status: Never   Smokeless tobacco: Never  Vaping Use   Vaping Use: Never used  Substance and Sexual Activity   Alcohol use: Not Currently    Comment: drink occasionally   Drug use: No   Sexual activity: Not Currently    Partners: Female    Birth control/protection: None    Allergies  Allergen Reactions   Baclofen Shortness Of Breath    Swollen ankles   Duloxetine Hives  Other reaction(s): Other (See Comments) "Hives, forgot who I was"   Penicillins Hives    Has patient had a PCN reaction causing immediate rash, facial/tongue/throat swelling, SOB or lightheadedness with hypotension: Unknown Has patient had a PCN reaction causing severe rash involving mucus membranes or skin necrosis: Yes Has patient had a PCN reaction that required hospitalization: Unknown Has patient had a PCN reaction occurring within the last 10 years: Unknown If all of the above answers are "NO", then may proceed with Cephalosporin use. Other reaction(s): Other (See Comments) Has patient had a PCN reaction causing immediate rash, facial/tongue/throat swelling, SOB or lightheadedness with hypotension: Unknown Has patient had a PCN reaction causing severe rash involving mucus membranes or skin necrosis: Yes Has patient had a PCN reaction that  required hospitalization: Unknown Has patient had a PCN reaction occurring within the last 10 years: Unknown If all of the above answers are "NO", then may proceed with Cephalosporin use.   Sulfa Antibiotics Hives, Nausea And Vomiting and Other (See Comments)   Benadryl [Diphenhydramine]     Started throwing up   Gluten Meal    Penicillin V Potassium Other (See Comments)   Claritin [Loratadine] Other (See Comments)    shaking   Duloxetine Hcl Hives and Other (See Comments)    "Hives, forgot who I was"   Ultram [Tramadol] Other (See Comments)    Dizzy     Outpatient Medications Prior to Visit  Medication Sig Dispense Refill   albuterol (PROVENTIL) (2.5 MG/3ML) 0.083% nebulizer solution Take 3 mLs (2.5 mg total) by nebulization every 4 (four) hours as needed for wheezing or shortness of breath. 150 mL 1   albuterol (VENTOLIN HFA) 108 (90 Base) MCG/ACT inhaler Inhale two puffs every 4-6 hours if needed for cough or wheeze. 18 g 1   botulinum toxin Type A (BOTOX) 200 units injection Provider to inject 155 units into the muscles of the head and neck every 12 weeks. Discard remainder. 1 each 1   butalbital-apap-caffeine-codeine (FIORICET WITH CODEINE) 50-325-40-30 MG capsule Take 1 capsule by mouth every 4 (four) hours as needed for headache.     chlorthalidone (HYGROTON) 25 MG tablet Take 25 mg by mouth every morning.     dupilumab (DUPIXENT) 300 MG/2ML prefilled syringe INJECT 300MG  SUBCUTANEOUSLY  EVERY OTHER WEEK 4 mL 11   EPINEPHrine 0.3 mg/0.3 mL IJ SOAJ injection Inject 0.3 mg into the muscle as needed for anaphylaxis. 1 each 1   escitalopram (LEXAPRO) 20 MG tablet Take 1 tablet (20 mg total) by mouth daily. 90 tablet 0   famotidine (PEPCID) 20 MG tablet Take 20 mg by mouth at bedtime.     hydrOXYzine (ATARAX) 50 MG tablet Take 50 mg by mouth at bedtime as needed.     ibuprofen (ADVIL) 400 MG tablet Take 400 mg by mouth every 8 (eight) hours as needed.     lamoTRIgine (LAMICTAL) 100 MG  tablet Take 100 mg by mouth 2 (two) times daily.     levETIRAcetam (KEPPRA) 500 MG tablet TAKE 1 TABLET BY MOUTH TWICE DAILY 180 tablet 3   levocetirizine (XYZAL) 5 MG tablet TAKE 1 TABLET BY MOUTH ONCE DAILY IN THE EVENING 30 tablet 5   linaclotide (LINZESS) 290 MCG CAPS capsule TAKE 1 CAPSULE BY MOUTH ONCE DAILY 30  MINUTES  BEFORE  BREAKFAST 90 capsule 3   metaxalone (SKELAXIN) 800 MG tablet Take 800 mg by mouth 3 (three) times daily.     methocarbamol (ROBAXIN) 500 MG tablet Take  500 mg by mouth 3 (three) times daily as needed.     metoprolol succinate (TOPROL-XL) 25 MG 24 hr tablet Take 1 tablet (25 mg total) by mouth daily. 90 tablet 3   mirtazapine (REMERON) 45 MG tablet Take 1 tablet (45 mg total) by mouth at bedtime. 90 tablet 0   montelukast (SINGULAIR) 10 MG tablet TAKE 1 TABLET BY MOUTH AT BEDTIME (Patient taking differently: as needed.) 30 tablet 5   Olopatadine HCl (PATADAY) 0.2 % SOLN Place 1 drop each eye once a day as needed for itchy watery eyes 2.5 mL 5   omeprazole (PRILOSEC) 40 MG capsule TAKE 1 CAPSULE BY MOUTH ONCE DAILY BEFORE BREAKFAST 30 capsule 5   ondansetron (ZOFRAN) 8 MG tablet 1 tablet as needed for nausea Orally every 8 hours as needed for 14 days     oxyCODONE-acetaminophen (PERCOCET) 10-325 MG tablet Take 1 tablet by mouth 3 (three) times daily.     potassium chloride 20 MEQ/15ML (10%) SOLN      rizatriptan (MAXALT) 10 MG tablet      telmisartan (MICARDIS) 40 MG tablet Take 40 mg by mouth daily.     TRELEGY ELLIPTA 200-62.5-25 MCG/ACT AEPB Inhale 1 puff by mouth once daily 60 each 0   triamcinolone ointment (KENALOG) 0.1 % APPLY TOPICALLY TWICE DAILY 454 g 0   No facility-administered medications prior to visit.    Review of Systems  Constitutional:  Negative for chills, diaphoresis, fever, malaise/fatigue and weight loss.  HENT:  Negative for congestion.   Respiratory:  Positive for cough. Negative for hemoptysis, sputum production, shortness of breath and  wheezing.   Cardiovascular:  Negative for chest pain, palpitations and leg swelling.     Objective:   Vitals:   06/11/22 1515  BP: 106/70  Pulse: 81  Temp: 97.9 F (36.6 C)  TempSrc: Oral  SpO2: 98%  Weight: 145 lb 6.4 oz (66 kg)  Height: 5\' 4"  (1.626 m)   SpO2: 98 % O2 Device: None (Room air)  Physical Exam: General: Well-appearing, no acute distress HENT: [place], AT Eyes: EOMI, no scleral icterus Respiratory: Clear to auscultation bilaterally.  No crackles, wheezing or rales Cardiovascular: RRR, -M/R/G, no JVD Extremities:-Edema,-tenderness Neuro: AAO x4, CNII-XII grossly intact Psych: Normal mood, normal affect  Data Reviewed:  Imaging: CT Chest 11/28/21 - 7 mm ill-defined pleural based nodular density along lateral left lung base CXR 03/13/22 - No infiltrate effusion or edema CT Chest 06/06/21 - 9 mm pleural based lung nodule in the left lateral base  PFT: Spirometry 12/28/21 FVC 3.10 (92%) FEV1 2.57 (96%) Ratio 83   Interpretation: Normal spirometry  Spirometry 03/13/22 FVC 3.24 (104%) FEV1 2.54 (102%) Ratio 78   Interpretation: Normal spirometry  Labs: CBC    Component Value Date/Time   WBC 7.2 08/24/2021 1405   RBC 4.41 08/24/2021 1405   HGB 14.5 08/24/2021 1405   HGB 14.0 05/13/2019 1531   HCT 41.8 08/24/2021 1405   HCT 38.9 05/13/2019 1531   PLT 391 08/24/2021 1405   PLT 427 05/13/2019 1531   MCV 94.8 08/24/2021 1405   MCV 92 05/13/2019 1531   MCH 32.9 08/24/2021 1405   MCHC 34.7 08/24/2021 1405   RDW 11.9 08/24/2021 1405   RDW 12.1 05/13/2019 1531   LYMPHSABS 2.6 05/13/2019 1531   MONOABS 0.5 08/09/2016 0956   EOSABS 0.2 05/13/2019 1531   BASOSABS 0.1 05/13/2019 1531   Absolute eos 05/13/19 - 200     Assessment & Plan:   Discussion:  52 year old female with COPD/asthma, seasonal allergies, fibromyalgia, GAD, depression, reflex, migraines, RLS, DDD, HTN, seizures who for follow-up pulmonary nodule and asthma. Reviewed history and prior CT  report. Discussed asthma symptoms which are being managed by Allergy. Agree with transitioning to new biologic.    LLL lung nodule 8 mm ORDER CT Chest without contrast in June 2024  Severe persistent asthma CONTINUE Trelegy CONTINUE Albuterol as needed CONTINUE nebulizer as needed CONTINUE Dupixent until Allergy  Health Maintenance Immunization History  Administered Date(s) Administered   Influenza Split 05/11/2014   Influenza, Quadrivalent, Recombinant, Inj, Pf 12/17/2018   Influenza, Seasonal, Injecte, Preservative Fre 12/22/2019   Influenza,inj,Quad PF,6+ Mos 11/12/2016   Influenza-Unspecified 11/12/2016, 04/13/2018, 12/26/2018   Moderna Sars-Covid-2 Vaccination 07/20/2019, 08/24/2019   Tdap 11/12/2016   CT Lung Screen - never smoker. Not qualified  No orders of the defined types were placed in this encounter. No orders of the defined types were placed in this encounter.   No follow-ups on file.  I have spent a total time of 45-minutes on the day of the appointment reviewing prior documentation, coordinating care and discussing medical diagnosis and plan with the patient/family. Imaging, labs and tests included in this note have been reviewed and interpreted independently by me.  [place], MD [facility] 06/11/2022 3:20 PM  Office Number [phone]

## 2022-06-11 NOTE — Patient Instructions (Addendum)
LLL lung nodule 8 mm ORDER CT Chest without contrast in June 2024  Severe persistent asthma CONTINUE Trelegy CONTINUE Albuterol as needed CONTINUE nebulizer as needed CONTINUE Dupixent until Allergy

## 2022-06-12 ENCOUNTER — Encounter (HOSPITAL_BASED_OUTPATIENT_CLINIC_OR_DEPARTMENT_OTHER): Payer: Self-pay | Admitting: Pulmonary Disease

## 2022-06-14 ENCOUNTER — Other Ambulatory Visit: Payer: Self-pay | Admitting: Allergy & Immunology

## 2022-06-24 ENCOUNTER — Encounter: Payer: Self-pay | Admitting: Physical Medicine & Rehabilitation

## 2022-07-04 ENCOUNTER — Other Ambulatory Visit (HOSPITAL_COMMUNITY): Payer: Self-pay

## 2022-07-08 ENCOUNTER — Encounter: Payer: 59 | Attending: Physical Medicine & Rehabilitation | Admitting: Physical Medicine & Rehabilitation

## 2022-07-08 ENCOUNTER — Encounter: Payer: Self-pay | Admitting: Physical Medicine & Rehabilitation

## 2022-07-08 VITALS — BP 116/82 | HR 94 | Ht 64.0 in | Wt 148.0 lb

## 2022-07-08 DIAGNOSIS — G8929 Other chronic pain: Secondary | ICD-10-CM | POA: Diagnosis present

## 2022-07-08 DIAGNOSIS — Z5181 Encounter for therapeutic drug level monitoring: Secondary | ICD-10-CM | POA: Diagnosis not present

## 2022-07-08 DIAGNOSIS — M5441 Lumbago with sciatica, right side: Secondary | ICD-10-CM | POA: Insufficient documentation

## 2022-07-08 DIAGNOSIS — G894 Chronic pain syndrome: Secondary | ICD-10-CM | POA: Diagnosis not present

## 2022-07-08 DIAGNOSIS — M170 Bilateral primary osteoarthritis of knee: Secondary | ICD-10-CM | POA: Diagnosis present

## 2022-07-08 DIAGNOSIS — F319 Bipolar disorder, unspecified: Secondary | ICD-10-CM

## 2022-07-08 DIAGNOSIS — M797 Fibromyalgia: Secondary | ICD-10-CM | POA: Diagnosis present

## 2022-07-08 DIAGNOSIS — Z79899 Other long term (current) drug therapy: Secondary | ICD-10-CM

## 2022-07-08 NOTE — Progress Notes (Signed)
Subjective:    Patient ID: Claire [name], female    DOB: 02-09-1971, 52 y.o.   MRN: [id number]  HPI   HPI  Claire [name] is a 52 y.o. year old female  who  has a past medical history of Acid reflux, Allergy, Anxiety, Arthritis, Asthma, Carpal tunnel syndrome, DDD (degenerative disc disease), cervical, Depression, Fibromyalgia, Hypertension, Hypokalemia, Migraine, Palpitations, Panic attacks, PTSD (post-traumatic stress disorder), Seizures, Syncope and collapse, Tennis elbow, Ulcer, and Vertigo.   They are presenting to PM&R clinic as a new patient for pain management evaluation.  [name] reports she has had severe pain for many years.  She reports she had a laminectomy many years ago of her lumbar spine due to a ruptured disc. She has had back pain since this time that is gradually worsening.  She stopped working in 2017 and has been on disability since 2018.  She was also diagnosed with fibromyalgia several years ago.  Her worst pain is in her lower back however she also has pain throughout her whole body.  She says she is having significant issues with sciatica shooting down her right leg to the ankle and bottom of her foot.  She feels often like she has just been in a car wreck, has electrical pain in her arms and legs.  She also reports having fibromyalgia hangovers where she has fatigue after episodes of pain.  Additionally she reports pain due to osteoarthritis in her knees.  She has had shoulder surgeries bilaterally in the past. Back pain since that time that's worsening.  She was initially seen by [facility] and is now followed by [facility] pain management.  She says that she changed from [facility] pain to [facility] pain management to get additional options.  She denies discharge from [facility] pain management and has an appointment scheduled but she would like to switch to [facility] for this service.     Red flag symptoms: No red flags for back pain endorsed in Hx or ROS  Medications  tried: Topical medications -  denies benefit from voltaren  Nsaids 800 ibuprofen helps sometimes Tylenol  - doesn't help by itself  Hydrocodone- 5mg  helped in past Oxycodone 5mg  TID Buprenorphine- didn't help Milnacipran -currently taking and feels like this is helping her fibromyalgia  Gabapentin / Lyrica gabapentin and lyrica didn't help TCAs - not sure  SNRIs  - doesn't recal  Other treatments: PT/OT  - Hasn't tried for lower back  TENs unit-uses this  Surgery shoulder and lumbar spine laminectomy   Goals for pain control: For pain to be more tolerable Prior UDS results: No results found for: "LABOPIA", "COCAINSCRNUR", "LABBENZ", "AMPHETMU", "THCU", "LABBARB"    Pain Inventory Average Pain 8 Pain Right Now 8 My pain is sharp, burning, stabbing, tingling, and aching  In the last 24 hours, has pain interfered with the following? General activity 8 Relation with others 9 Enjoyment of life 9 What TIME of day is your pain at its worst? daytime and evening Sleep (in general) Poor  Pain is worse with: walking, bending, sitting, and standing Pain improves with: heat/ice, TENS, and injections Relief from Meds: 3  use a cane how many minutes can you walk? 5 ability to climb steps?  yes do you drive?  yes Do you have any goals in this area?  yes  disabled: date disabled .  numbness tingling spasms dizziness depression anxiety  Any changes since last visit?  no  Any changes since last visit?  no    Family History  Problem Relation Age of Onset   COPD Mother    Bronchitis Mother    Arthritis Mother    Depression Mother    Heart disease Mother    Hypertension Mother    Alcohol abuse Father    Early death Father        GSW   Migraines Sister    Colon cancer Maternal Grandmother    PKU Son    Sleep apnea Neg Hx    Social History   Socioeconomic History   Marital status: Single    Spouse name: Not on file   Number of children: 1   Years of  education: HS   Highest education level: Not on file  Occupational History   Occupation: disability    Comment: unemployed  Tobacco Use   Smoking status: Never   Smokeless tobacco: Never  Vaping Use   Vaping Use: Never used  Substance and Sexual Activity   Alcohol use: Not Currently    Comment: drink occasionally   Drug use: No   Sexual activity: Not Currently    Partners: Female    Birth control/protection: None  Other Topics Concern   Not on file  Social History Narrative   Patient is left handed.   Patient drinks very little caffeine.   Lives alone   Social Determinants of Health   Financial Resource Strain: Not on file  Food Insecurity: Not on file  Transportation Needs: Not on file  Physical Activity: Not on file  Stress: Not on file  Social Connections: Not on file   Past Surgical History:  Procedure Laterality Date   BACK SURGERY  1993   BIOPSY  10/21/2016   Procedure: BIOPSY;  Surgeon: Corbin Ade, MD;  Location: [facility];  Service: Endoscopy;;  gastric, esophagus   CARPAL TUNNEL RELEASE Left    2019   CHOLECYSTECTOMY     ESOPHAGOGASTRODUODENOSCOPY (EGD) WITH PROPOFOL N/A 10/21/2016   Procedure: ESOPHAGOGASTRODUODENOSCOPY (EGD) WITH PROPOFOL;  Surgeon: Corbin Ade, MD;  Location: [facility];  Service: Endoscopy;  Laterality: N/A;  9:30am   GALLBLADDER SURGERY     SHOULDER SURGERY Right    SPINE SURGERY  1993   lumbar disc surgery   Past Medical History:  Diagnosis Date   Acid reflux    Allergy    pollen, dust   Anxiety    Arthritis    Asthma    Carpal tunnel syndrome    bilateral   DDD (degenerative disc disease), cervical    Depression    Fibromyalgia    Hypertension    Hypokalemia    Migraine    Palpitations    Panic attacks    PTSD (post-traumatic stress disorder)    Seizures    unknown etiology; last seizure was 2 years ago; on meds.   Syncope and collapse    Tennis elbow    Ulcer    Vertigo    Ht 5\' 4"  (1.626 m)    Wt 148 lb (67.1 kg)   BMI 25.40 kg/m   Opioid Risk Score:   Fall Risk Score:  `1  Depression screen PHQ 2/9     07/08/2022    1:35 PM 09/05/2017   11:28 AM 07/23/2017    8:28 AM 03/17/2017    9:44 AM 12/26/2016   10:20 AM 08/14/2016   11:24 AM 06/11/2016    9:14 AM  Depression screen PHQ 2/9  Decreased Interest 3   0  0 0 3  Down, Depressed, Hopeless 1   0 0 0   PHQ - 2 Score 4   0 0 0 3  Altered sleeping 3      3  Tired, decreased energy 2      3  Change in appetite 3      3  Feeling bad or failure about yourself  1      3  Trouble concentrating 2      3  Moving slowly or fidgety/restless 2      0  Suicidal thoughts 0      1  PHQ-9 Score 17      19  Difficult doing work/chores Not difficult at all           Information is confidential and restricted. Go to Review Flowsheets to unlock data.      Review of Systems  Musculoskeletal:  Positive for back pain, gait problem, myalgias and neck pain.      Objective:   Physical Exam   Gen: no distress, normal appearing, frequently getting up and walking around appears uncomfortable, constantly changing positions HEENT: oral mucosa pink and moist, NCAT Cardio: Reg rate Chest: normal effort, normal rate of breathing Abd: soft, non-distended Ext: no edema Psych: pleasant, normal affect Skin: intact Neuro: Alert and awake, follows commands, cranial nerves II through XII grossly intact, normal speech and language Strength 5 out of 5 in bilateral upper extremities and lower extremities Sensation  touch intact in all 4 extremities, however reports altered sensation throughout her arms and legs Hyperreflexive at bilateral knees No dysmetria or ataxia noted in bilateral upper extremities Hoffmann's negative, no clonus musculoskeletal:  Tenderness to palpation throughout bilateral cervical spine paraspinals, shoulders, elbows, wrist, hips, knees, ankles. Greatest tenderness over lumbar paraspinal muscles Hyperreflexic at  knees Hoffmans negative No attaxia or dysmetria noted SLR negative however resulted in back pain, FABER and FADIR resulted in back pain and knee pain      MRI  L spine 'IMPRESSION: 09/03/2018 1. Small right subarticular disc extrusion with slight superior migration at L5-S1, potentially affecting either the exiting right L5 or descending S1 nerve roots. This is increased in size relative to 2014. 2. Progressive degenerative spondylolysis elsewhere within the lumbar spine with resultant mild spinal stenosis at L3-4. Mild bilateral L4 and L5 foraminal narrowing. 3. Sequelae of prior left laminotomy at L4-5 without residual or recurrent stenosis. Mild clumping of the nerve roots of the cauda equina at this level suggestive of mild arachnoiditis, similar to previous. Assessment & Plan:   Chronic lower back pain with did S1 radiculopathy on the right -Most recent MRI with evidence of lumbar spondylosis and degenerative disc disease.  MRI showed evidence of disc extrusion potentially affecting right L5 or S1 nerve roots, possible mild arachnoiditis was noted on MRI also -She has a history of left laminectomy L4-5 -Patient has having greatest pain in her lower back, she is currently following with [facility] and has been on opioid therapy for many years -Patient denies trying physical therapy recently, will place consult, she would like to do it at the center near her -Provided list of foods to assist with pain control, discussed trying turmeric -Will complete UDS and pain agreement today, discussed this does not guarantee pain medications -Currently on oxycodone 5/325, consider continuing.  Would like to see records from her prior pain clinic first.  Patient says she will request them today to be sent to our office.  Number -  Will refer to Dr. Wynn Banker for trial of ESI, she says this helped in the past  Fibromyalgia -Continue Milnacipran 50mg  BID, duloxetine could be considered  as alternative medication needed -Suspect this is contributing to her body wide pain  Bipolar Disorder -Continue to follow with psychiatry, on zyprexa  Migraine headaches Botox helps  Bilateral knee OA -She reports outside x-rays that showed knee OA.

## 2022-07-09 ENCOUNTER — Other Ambulatory Visit (HOSPITAL_COMMUNITY): Payer: Self-pay

## 2022-07-12 LAB — TOXASSURE SELECT,+ANTIDEPR,UR

## 2022-07-15 ENCOUNTER — Encounter: Payer: Self-pay | Admitting: Neurology

## 2022-07-15 ENCOUNTER — Ambulatory Visit (INDEPENDENT_AMBULATORY_CARE_PROVIDER_SITE_OTHER): Payer: 59 | Admitting: Neurology

## 2022-07-15 VITALS — BP 124/87 | HR 100 | Ht 64.0 in | Wt 149.2 lb

## 2022-07-15 DIAGNOSIS — G43719 Chronic migraine without aura, intractable, without status migrainosus: Secondary | ICD-10-CM | POA: Diagnosis not present

## 2022-07-15 MED ORDER — ONABOTULINUMTOXINA 200 UNITS IJ SOLR
155.0000 [IU] | Freq: Once | INTRAMUSCULAR | Status: AC
  Administered 2022-07-15: 155 [IU] via INTRAMUSCULAR

## 2022-07-15 NOTE — Patient Instructions (Signed)
Please remember, botulinum toxin takes about 3-7 days to kick in. As discussed, this is not a pain shot. The purpose of the injections is to gradually improve your symptoms. In some patients it takes up to 2-3 weeks to make a difference and it wears off with time. Sometimes it may wear off before it is time for the next injection. We still should wait till the next 3 monthly injection, because injecting too frequently may cause you to develop immunity to the botulinum toxin. We are looking for a reduction in the severity and/or frequency of your symptoms. As a reminder, side effects to look out for are (but not limited to): mouth dryness, dryness of the eyes, heaviness of your head or muscle weakness, including droopy face or droopy eyelid(s), rarely: speech or swallowing difficulties and very rarely: breathing difficulties. Some people have transient neck pain or soreness which typically responds to over-the-counter anti-inflammatory medication and local heat application with a heat pad. If you think you have a severe reaction to the botulinum toxin, such as weakness, trouble speaking, trouble breathing, or trouble swallowing, you have to call 911 or have someone take you to the nearest emergency room. However, most people have either no or minimal side effects from the injections. It is normal to have a little bit of redness and swelling around the injection sites which usually improves after a few hours. Rarely, there may be a bruise that improves on its own. Most side effects reported are very mild and resolve within 10-14 days. Please feel free to call us if you have any additional questions or concerns: [phone] or email us through My Chart. We may have to adjust the dose over time, depending on your results from this injection and your overall response over time to this medication.   

## 2022-07-15 NOTE — Progress Notes (Signed)
Subjective:    Patient ID: Claire [name] is a 52 y.o. female.  HPI  Claire [name] is a 52 year old right-handed woman with an underlying medical history of hypertension, migraine headaches, fibromyalgia, history of seizure-like episodes, carpal tunnel syndrome, anxiety, depression, vertigo, arthritis, asthma, allergies, NASH, hypokalemia, status post back surgery, left ureter surgery, right shoulder surgery, carpal tunnel surgery, and gallbladder surgery, who presents for her 3rd Botox injection for intractable, chronic migraines. The patient is unaccompanied today.   She had her 2nd injection on 04/22/22, at which time she reported doing better, Botox had helped. She had no side effects.    Written informed consent for recurrent, 3 monthly intramuscular injections with botulinum toxin for this indication has been obtained. I have previously talked to the patient in detail about expectations, limitations, benefits as well as potential adverse effects of botulinum toxin injections. The patient understands that the side effects include (but are not limited to): Mouth dryness, dryness of eyes, speech and swallowing difficulties, respiratory depression or problems breathing, weakness of muscles including more distant muscles than the ones injected, flu-like symptoms, myalgias, injection site reactions such as redness, itching, swelling, pain, and infection.   200 units of botulinum toxin type A were reconstituted using preservative-free normal saline to a concentration of 10 units per 0.1 mL and drawn up into 1 mL tuberculin syringes.    Botox for migraines.  Botox- 200 units x 1 vial, B/B Lot: [id number] Expiration: 08/2024 NDC: 5409-8119-14  Bacteriostatic 0.9% Sodium Chloride- 2.3 mL  Lot: [id number] Expiration: 01/2024 NDC: 13086-578-46     Today, 07/15/2022: She reports doing well with regards to her migraines.  She still has neck pain.  She has started seeing a new pain management doctor.  She is  supposed to start physical therapy and they are supposed to do a back injection as well.  She had no side effects from the Botox and feels that it continues to do well for her.  O/E: BP 124/87   Pulse 100   Ht 5\' 4"  (1.626 m)   Wt 149 lb 3.2 oz (67.7 kg)   BMI 25.61 kg/m   The patient was situated in a chair, sitting comfortably. After preparing the areas with 70% isopropyl alcohol and using a 26 gauge 1 1/2 inch hollow lumen recording EMG needle for the neck injections as well as a 30 gauge 1 inch needle for the facial injections, a total dose of 155 units of botulinum toxin type A in the form of Botox was injected into the muscles and the following distribution and quantities:   #1: 10 units on the right and 10 units in the left frontalis muscles, broken down in 2 sites on each side. #2: 5 units in the right and 5 units in the left corrugator muscles. #3: 15 units in the right and 15 units in the left occipitalis muscles, broken down in 3 sites on each side. #4: 20 units in the right and 20 units in the left temporalis muscles, broken down in 4 sites on each side.  #5: 15 units on the right and 15 units in the left upper trapezius muscles, broken down in 3 sites on each side. #6: 10 units in the right and 10 units in the left splenius capitis muscles, broken down in 2 sites on each side.  #7: 2.5 units in the right and 2.5 units in the left procerus muscles.   EMG guidance was utilized for the neck injections with mild to [employer]  EMG activity noted, especially in the b/l splenius capitis muscles.  A dose of 45 units out of a total dose of 200 units was discarded as unavoidable waste.   The patient tolerated the procedure well without immediate complications. She was advised to make a followup appointment for repeat injections in 3 months from now and encouraged to call us with any interim questions, concerns, problems, or updates. She was in agreement and did not have any questions prior to  leaving clinic today.   Previously:   She had her initial injection on 01/28/2022, at which time she received a total dose of 155 units of Botox as per migraine injection protocol.   I saw her on 01/08/2022.  Please refer to that encounter for details of her history and examination and medications tried and failed.    She saw Margie Ege, NP in follow-up on 01/24/2022 for her seizure-like events.  She was kept on Keppra.    01/08/22: reports a longstanding history of migraine headaches.  She reports that she has had migraines since her early 37s.  Her sister has migraines.  I reviewed your office note from 10/22/2021.   She had seen Dr. Anne Hahn and nurse practitioner Margie Ege for seizure-like episodes.  She has not been seen in this clinic for over 1 year.  She was last seen by Margie Ege, NP on 08/24/2020 and I reviewed the note, I copied the note below for reference.  She was last seen by Dr. Stephanie Acre on 08/24/2019 and I reviewed the note.  I copied the note below for reference.   She reports a history of seizures, is not clear on what type of seizures she has.  She has not had any seizure-like events in about 2 years, she believes that the Keppra has been helpful.   She is currently on multiple medications including centrally acting medications. However, she does not take all the medications that are listed on your list, she is not sure about the exact list of her medications, she is also not sure as to what medications she has tried for migraines.  She has been prescribed Maxalt but reports that it did not help.  She is no longer on Fioricet, she is currently on levetiracetam 500 mg twice daily, no longer on Norco 10 mg strength, she takes oxycodone 5 mg strength 3 times a day as needed, she is still on hydroxyzine as I understand, she is no longer on metaxalone, she is on Savella, she is taking Robaxin as well.  She takes Lamictal, she believes it is 100 mg once daily.   She reports that her  sister takes Topamax.  She also is not sure if she herself tolerated normal.  She has never been on Botox injections.  She would be willing to consider Botox injections and we talked about the injections, potential side effects and she was also given additional patient information.  She is willing to proceed with the authorization for Botox injections for migraines.  Her migraines are often generalized, she has associated light sensitivity, sometimes she gets blurry vision with them.  She had an eye examination last year, none this year, she is encouraged to make an appointment for this year.  She has prescription bifocal eyeglasses.   She denies any sudden onset of one-sided weakness or numbness or tingling.   She had a brain MRI in 2014 which was benign.  She would be willing to proceed with a brain MRI.  She does  not smoke, she does not drink any alcohol, she does not drink caffeine daily.  She hydrates with water but drinks about 70 ounces of water per day, she has a history of low potassium.  She had blood work through your office on 10/22/2021 and I reviewed the results: BUN was 17, creatinine 0.88, potassium borderline elevated at 6.1, calcium slightly elevated at 10.9, alk phos 92, AST 13, ALT 13.  CBC with differential and platelets showed borderline elevated hemoglobin at 15.8, hematocrit borderline elevated at 45.9.  TSH normal at 0.53.  Lipid panel benign.   She has over 15 headache days per month, migraine attacks are about 4-5 times a week.   I had evaluated her about 2-1/2 years ago for sleep apnea concern.  A sleep study in April 2021 did not show any significant obstructive sleep apnea.     08/24/2020 (SS): <<Ms. Gentz is a 52 year old female with history of fibromyalgia and seizure-like episodes.  Is on Keppra.  Seizure-like events are described as confusion, amnesia for 30 to 60 minutes.  No further spells in the last year.  Denies any side effects of Keppra.  Sees pain management, for  fibro and chronic low back pain.  Uses a cane to ambulate.  Lives alone, drives a car, lives in [place].  Had left rotator cuff surgery in April, remains in PT.  Has had several GI illnesses this year, has lost 20 pounds.  Here today for evaluation unaccompanied.>>   08/24/2019 (Dr. Anne Hahn): <<Ms. Kozma is a 52 year old left-handed [place] female with a history of fibromyalgia and seizure-like episodes in the past.  The patient was last seen through this office in July 2018 for this reason.  The patient was on Keppra and had a good response to the medication, she had not had any events while on the medication.  It appears that she stopped the medication when she had no insurance and could not afford the drug and then began having seizure type events again.  Last such event was 2 weeks ago.  The patient will have events where she becomes confused, she has amnesia for 30 to 60 minutes.  She does not have generalized jerking or tongue biting or bowel and bladder incontinence.  The patient is still operating a motor vehicle apparently.  She is followed through a pain center for her fibromyalgia.  She is having troubles with gait instability, she has chronic low back pain, she uses a cane to get around and she will occasionally fall.  She does not drink alcohol.  She comes to this office for an evaluation.>> 07/13/19 (SA): Ms. Allcorn is a 52 year old left-handed woman with an underlying medical history of asthma, hypertension, migraine, fibromyalgia and blackout spells, suspected seizures on Keppra (for which she has seen Dr. Anne Hahn), allergies, acid reflux, degenerative disc disease, depression, anxiety, history of syncope, PTSD by chart review, palpitations, vertigo, and overweight state, who Presents for follow-up consultation of her sleep disturbance, after interim sleep testing.  The patient is unaccompanied today.  I first met her on 06/09/2019 at the request of her allergy and asthma specialist, at which time the patient  reported snoring and excessive daytime somnolence.  She had trouble going to sleep and staying asleep.  She was advised to proceed with a sleep study.  She had a baseline sleep study on 06/24/2019, which showed a sleep efficiency of 94.6%, sleep was primarily light stage sleep with over 90% of stage II sleep and absence of REM sleep.  Her total AHI was 1.4/h, O2 nadir was 84%, average oxygen saturation was 90%.  She had no significant PLM's.  Mild and intermittent snoring was noted. She was advised regarding her test results by phone call.  She requested a follow-up appointment to discuss findings in more detail.   She reports no new symptoms, as far as her sleep is concerned.  She reports that at the time of her sleep study she fell asleep much quicker than usual at home.  She did not feel fully rested, she did not think she slept nearly as much as the sleep study indicated.  We talked about her sleep study results in detail.  She feels that the BuSpar is not as effective for her anxiety.  She has an appointment with her psychiatrist/behavioral health coming up.  She has also made a follow-up appointment with Dr. Anne Hahn.     06/09/19 (SA): (She) reports snoring and excessive daytime somnolence.  I reviewed the office note from 05/13/2019.  Her Epworth sleepiness score is 0/24, Fatigue severity score is 62 out of 63.  She reports trouble going to sleep and staying asleep.  She has tried melatonin over-the-counter which did not help.  She does snore.  She is single and lives alone but has been told by a cousin that she had pauses in her breathing when she was witnessed to sleep on the couch one time.  The patient denies any recurrent morning headaches but does have recurrent headaches she reports.  She has not made a follow-up appointment with Dr. Anne Hahn but would like to make one on her way out today.  She reports increase in stress.  She reports not being able to stay asleep.  She has no obvious family history of  sleep apnea as she recalls.  She does not have a TV in her bedroom but sometimes does look at her phone at night.  She has a bedtime between 11 and midnight and rise time generally between 6 and 7.  She is a non-smoker and does not drink alcohol.  She drinks very little caffeine, approximately 1 soda per week.     Review of Systems  Neurological:        Botox for migraines.  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 CMA

## 2022-07-18 ENCOUNTER — Encounter (HOSPITAL_COMMUNITY): Payer: Self-pay

## 2022-07-23 ENCOUNTER — Encounter: Payer: 59 | Admitting: Physical Medicine & Rehabilitation

## 2022-08-05 ENCOUNTER — Encounter: Payer: 59 | Attending: Physical Medicine & Rehabilitation | Admitting: Physical Medicine & Rehabilitation

## 2022-08-05 ENCOUNTER — Other Ambulatory Visit: Payer: Self-pay | Admitting: Physical Medicine & Rehabilitation

## 2022-08-05 ENCOUNTER — Encounter: Payer: Self-pay | Admitting: Physical Medicine & Rehabilitation

## 2022-08-05 VITALS — BP 108/78 | HR 91 | Ht 64.0 in | Wt 151.0 lb

## 2022-08-05 DIAGNOSIS — M797 Fibromyalgia: Secondary | ICD-10-CM | POA: Diagnosis present

## 2022-08-05 DIAGNOSIS — Z79899 Other long term (current) drug therapy: Secondary | ICD-10-CM | POA: Diagnosis present

## 2022-08-05 DIAGNOSIS — M5441 Lumbago with sciatica, right side: Secondary | ICD-10-CM | POA: Insufficient documentation

## 2022-08-05 DIAGNOSIS — Z5181 Encounter for therapeutic drug level monitoring: Secondary | ICD-10-CM | POA: Insufficient documentation

## 2022-08-05 DIAGNOSIS — G8929 Other chronic pain: Secondary | ICD-10-CM | POA: Diagnosis present

## 2022-08-05 DIAGNOSIS — F319 Bipolar disorder, unspecified: Secondary | ICD-10-CM

## 2022-08-05 DIAGNOSIS — M170 Bilateral primary osteoarthritis of knee: Secondary | ICD-10-CM | POA: Diagnosis present

## 2022-08-05 DIAGNOSIS — G894 Chronic pain syndrome: Secondary | ICD-10-CM | POA: Diagnosis present

## 2022-08-05 NOTE — Progress Notes (Signed)
Subjective:    Patient ID: Claire [name], female    DOB: 1970-08-31, 52 y.o.   MRN: [id number]  HPI HPI 08/05/2022   Claire [name] is a 52 y.o. year old female  who  has a past medical history of Acid reflux, Allergy, Anxiety, Arthritis, Asthma, Carpal tunnel syndrome, DDD (degenerative disc disease), cervical, Depression, Fibromyalgia, Hypertension, Hypokalemia, Migraine, Palpitations, Panic attacks, PTSD (post-traumatic stress disorder), Seizures, Syncope and collapse, Tennis elbow, Ulcer, and Vertigo.   They are presenting to PM&R clinic as a new patient for pain [name] evaluation.  Claire [name] reports she has had severe pain for many years.  She reports she had a laminectomy many years ago of her lumbar spine due to a ruptured disc. She has had back pain since this time that is gradually worsening.  She stopped working in 2017 and has been on disability since 2018.  She was also diagnosed with fibromyalgia several years ago.  Her worst pain is in her lower back however she also has pain throughout her whole body.  She says she is having significant issues with sciatica shooting down her right leg to the ankle and bottom of her foot.  She feels often like she has just been in a car wreck, has electrical pain in her arms and legs.  She also reports having fibromyalgia hangovers where she has fatigue after episodes of pain.  Additionally she reports pain due to osteoarthritis in her knees.  She has had shoulder surgeries bilaterally in the past. Back pain since that time that's worsening.  She was initially seen by Claire [name] and is now followed by Claire [name] pain [name].  She says that she changed from Claire pain to Claire [name] to get additional options.  She denies discharge from Claire [name] and has an appointment scheduled but she would like to switch to [name] for this service.       Red flag symptoms: No red flags for back pain endorsed in Hx or ROS    [name] tried: Topical [name] -  denies benefit from voltaren  Nsaids 800 ibuprofen helps sometimes Tylenol  - doesn't help by itself  Hydrocodone- 5mg  helped in past Oxycodone 5mg  TID Buprenorphine- didn't help Milnacipran -currently taking and feels like this is helping her fibromyalgia   Gabapentin / Lyrica gabapentin and lyrica didn't help TCAs - not sure  SNRIs  - doesn't recal   Other treatments: PT/OT  - Hasn't tried for lower back  TENs unit-uses this  Surgery shoulder and lumbar spine laminectomy  Interval history 08/05/2022 Patient is here for follow-up regarding her chronic pain.  She is continue to follow with Claire [name] until I begin to prescribe her [name].  She was recently refilled Percocet 5 #90 tabs.  She reports has been helping her pain and not causing significant side effects.  We did discuss her UDS which noted methamphetamine.  I also have obtain records from Claire [name] and appears that her most recent UDS from last week also had this compound.  Patient reports she is working with physical therapy and her pain is getting better.  Patient did have methamphetamine noted on UDS.  Patient denies use of any of these compounds.  She does use inhalers.   Pain Inventory Average Pain 8 Pain Right Now 8 My pain is constant, sharp, burning, stabbing, tingling, and aching  In the last 24 hours, has pain interfered with the following? General  activity 7 Relation with others 6 Enjoyment of life 8 What TIME of day is your pain at its worst? morning , daytime, evening, and night Sleep (in general) Poor  Pain is worse with: bending, sitting, standing, and some activites Pain improves with: heat/ice, medication, TENS, and injections Relief from Meds: 4  Family History  Problem Relation Age of Onset   COPD Mother    Bronchitis Mother    Arthritis Mother    Depression Mother    Heart disease Mother     Hypertension Mother    Alcohol abuse Father    Early death Father        GSW   Migraines Sister    Colon cancer Maternal Grandmother    PKU Son    Sleep apnea Neg Hx    Social History   Socioeconomic History   Marital status: Single    Spouse name: Not on file   Number of children: 1   Years of education: HS   Highest education level: Not on file  Occupational History   Occupation: disability    Comment: unemployed  Tobacco Use   Smoking status: Never   Smokeless tobacco: Never  Vaping Use   Vaping Use: Never used  Substance and Sexual Activity   Alcohol use: Not Currently    Comment: drink occasionally   Drug use: No   Sexual activity: Not Currently    Partners: Female    Birth control/protection: None  Other Topics Concern   Not on file  Social History Narrative   Patient is left handed.   Patient drinks very little caffeine.   Lives alone   Social Determinants of Health   Financial Resource Strain: Not on file  Food Insecurity: Not on file  Transportation Needs: Not on file  Physical Activity: Not on file  Stress: Not on file  Social Connections: Not on file   Past Surgical History:  Procedure Laterality Date   BACK SURGERY  1993   BIOPSY  10/21/2016   Procedure: BIOPSY;  Surgeon: Claire Ade, MD;  Location: [name];  Service: Endoscopy;;  gastric, esophagus   CARPAL TUNNEL RELEASE Left    2019   CHOLECYSTECTOMY     ESOPHAGOGASTRODUODENOSCOPY (EGD) WITH PROPOFOL N/A 10/21/2016   Procedure: ESOPHAGOGASTRODUODENOSCOPY (EGD) WITH PROPOFOL;  Surgeon: Claire Ade, MD;  Location: [name];  Service: Endoscopy;  Laterality: N/A;  9:30am   GALLBLADDER SURGERY     SHOULDER SURGERY Right    SPINE SURGERY  1993   lumbar disc surgery   Past Surgical History:  Procedure Laterality Date   BACK SURGERY  1993   BIOPSY  10/21/2016   Procedure: BIOPSY;  Surgeon: Claire Ade, MD;  Location: [name];  Service: Endoscopy;;  gastric,  esophagus   CARPAL TUNNEL RELEASE Left    2019   CHOLECYSTECTOMY     ESOPHAGOGASTRODUODENOSCOPY (EGD) WITH PROPOFOL N/A 10/21/2016   Procedure: ESOPHAGOGASTRODUODENOSCOPY (EGD) WITH PROPOFOL;  Surgeon: Claire Ade, MD;  Location: [name];  Service: Endoscopy;  Laterality: N/A;  9:30am   GALLBLADDER SURGERY     SHOULDER SURGERY Right    SPINE SURGERY  1993   lumbar disc surgery   Past Medical History:  Diagnosis Date   Acid reflux    Allergy    pollen, dust   Anxiety    Arthritis    Asthma    Carpal tunnel syndrome    bilateral   DDD (degenerative disc disease), cervical  Depression    Fibromyalgia    Hypertension    Hypokalemia    Migraine    Palpitations    Panic attacks    PTSD (post-traumatic stress disorder)    Seizures (HCC)    unknown etiology; last seizure was 2 years ago; on meds.   Syncope and collapse    Tennis elbow    Ulcer    Vertigo    BP 108/78   Pulse 91   Ht 5\' 4"  (1.626 m)   Wt 151 lb (68.5 kg)   SpO2 98%   BMI 25.92 kg/m   Opioid Risk Score:   Fall Risk Score:  `1  Depression screen PHQ 2/9     08/05/2022    2:20 PM 07/08/2022    1:35 PM 09/05/2017   11:28 AM 07/23/2017    8:28 AM 03/17/2017    9:44 AM 12/26/2016   10:20 AM 08/14/2016   11:24 AM  Depression screen PHQ 2/9  Decreased Interest 1 3   0 0 0  Down, Depressed, Hopeless 1 1   0 0 0  PHQ - 2 Score 2 4   0 0 0  Altered sleeping  3       Tired, decreased energy  2       Change in appetite  3       Feeling bad or failure about yourself   1       Trouble concentrating  2       Moving slowly or fidgety/restless  2       Suicidal thoughts  0       PHQ-9 Score  17       Difficult doing work/chores  Not difficult at all          Information is confidential and restricted. Go to Review Flowsheets to unlock data.    Review of Systems  Musculoskeletal:  Positive for back pain, gait problem and neck pain.       Pain in both legs, right upper chest area  All other  systems reviewed and are negative.     Objective:   Physical Exam    Gen: no distress, normal appearing, frequently getting up and walking around appears uncomfortable, constantly changing positions HEENT: oral mucosa pink and moist, NCAT Cardio: Reg rate Chest: normal effort, normal rate of breathing Abd: soft, non-distended Ext: no edema Psych: pleasant, normal affect Skin: intact Neuro: Alert and awake, follows commands, cranial nerves II through XII grossly intact, normal speech and language No focal motor deficits Sensation  touch intact in all 4 extremities, however reports altered sensation throughout her arms and legs Hyperreflexive at bilateral knees No dysmetria or ataxia noted in bilateral upper extremities musculoskeletal:  Tenderness to palpation throughout bilateral cervical spine paraspinals, shoulders, elbows, wrist, hips, knees, ankles. Greatest tenderness over lumbar paraspinal muscles Slump test negative       MRI  L spine 'IMPRESSION: 09/03/2018 1. Small right subarticular disc extrusion with slight superior migration at L5-S1, potentially affecting either the exiting right L5 or descending S1 nerve roots. This is increased in size relative to 2014. 2. Progressive degenerative spondylolysis elsewhere within the lumbar spine with resultant mild spinal stenosis at L3-4. Mild bilateral L4 and L5 foraminal narrowing. 3. Sequelae of prior left laminotomy at L4-5 without residual or recurrent stenosis. Mild clumping of the nerve roots of the cauda equina at this level suggestive of mild arachnoiditis, similar to previous.     Assessment & Plan:  Chronic lower back pain with did S1 radiculopathy on the right -Most recent MRI with evidence of lumbar spondylosis and degenerative disc disease.  MRI showed evidence of disc extrusion potentially affecting right L5 or S1 nerve roots, possible mild arachnoiditis was noted on MRI also -She has a history of left  laminectomy L4-5 -Patient has having greatest pain in her lower back, she is currently following with [facility] and has been on opioid therapy for many years -Patient has started physical therapy.  Reports this is helping, continue with PT -Last visit provided list of foods to assist with pain control, discussed trying turmeric -Opioid agreement completed last visit -Currently on oxycodone 5/325, consider continuing.  Records obtained from [facility] -She was previously referred to Dr. Wynn Banker for [name] -Methamphetamine noted on UDS in my clinic and appears to be noted by [facility].  The result from [facility] appears to have just come back.  Attempted to call lab regarding false positives, currently this is not open and my call was not excepted.  I believe that false positives can occur from certain inhalers and there is a additional confirmatory test that can be completed.  Will repeat UDS today and if present will attempt to order confirmatory test. -addendum. Called [facility] with add D/L test    Fibromyalgia -Continue Milnacipran 50mg  BID-she reports this is helping with her nerve pain -Suspect this is contributing to her body wide pain   Bipolar Disorder -Continue to follow with psychiatry, on zyprexa   Migraine headaches Botox helps   Bilateral knee OA -She reports outside x-rays that showed knee OA.  Addendum d-methamphetamine  noted on test, likely this would be from controlled or illicit substance, pt to be made non-narcotic

## 2022-08-07 ENCOUNTER — Telehealth: Payer: Self-pay | Admitting: *Deleted

## 2022-08-07 NOTE — Telephone Encounter (Signed)
Urine drug screen for this encounter is consistent for prescribed medication. It is also positive for methamphetamine. Dr Natale Lay has spoken with her about this and she denies any use of street drugs. At the 08/05/22 appt it was too late to retest the sample for d-methamphetamine vs l-methamphetamine to determine if it is from illegal drug, so he will send for a new UDS on 08/05/22.

## 2022-08-08 ENCOUNTER — Encounter: Payer: 59 | Admitting: Physical Medicine & Rehabilitation

## 2022-08-08 LAB — TOXASSURE SELECT,+ANTIDEPR,UR

## 2022-08-10 LAB — D/L METHAMPHET, TOXASSURE ADD
d-Methamphetamine: 95 %
l-Methamphetamine: 5 %

## 2022-08-20 ENCOUNTER — Encounter: Payer: Self-pay | Admitting: Physical Medicine & Rehabilitation

## 2022-08-20 ENCOUNTER — Encounter: Payer: 59 | Attending: Physical Medicine & Rehabilitation | Admitting: Physical Medicine & Rehabilitation

## 2022-08-20 VITALS — BP 115/81 | HR 95 | Ht 64.0 in | Wt 152.0 lb

## 2022-08-20 DIAGNOSIS — R1031 Right lower quadrant pain: Secondary | ICD-10-CM | POA: Diagnosis present

## 2022-08-20 DIAGNOSIS — R1032 Left lower quadrant pain: Secondary | ICD-10-CM | POA: Insufficient documentation

## 2022-08-20 NOTE — Progress Notes (Signed)
Subjective:    Patient ID: Claire [name], female    DOB: 07-13-1970, 52 y.o.   MRN: [id number]  HPI 52 yo female referred by Dr Natale Lay to evaluate for spinal injection procedure to help manage chronic pain 40yr hx Low back pain bilateral radiating to both posterior thighs .  Pain occurs both day and night, not aggravated by any particular activity Currently doing physical therapy for the last month which  Partially helpful No significant difference R vs L Received what sounds like epidural injections with Dr. Manon Hilding but no medical records available , last injection was years ago. 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 General activity 8 Relation with others 7 Enjoyment of life 9 What TIME of day is your pain at its worst? morning , daytime, evening, and night Sleep (in general) Poor  Pain is worse with: walking, bending, sitting, inactivity, and standing Pain improves with: heat/ice, TENS, and injections Relief from Meds: 4  Family History  Problem Relation Age of Onset   COPD Mother    Bronchitis Mother    Arthritis Mother    Depression Mother    Heart disease Mother    Hypertension Mother    Alcohol abuse Father    Early death Father        GSW   Migraines Sister    Colon cancer Maternal Grandmother    PKU Son    Sleep apnea Neg Hx    Social History   Socioeconomic History   Marital status: Single    Spouse name: Not on file   Number of children: 1   Years of education: HS   Highest education level: Not on file  Occupational History   Occupation: disability    Comment: unemployed  Tobacco Use   Smoking status: Never   Smokeless tobacco: Never  Vaping Use   Vaping Use: Never  used  Substance and Sexual Activity   Alcohol use: Not Currently    Comment: drink occasionally   Drug use: No   Sexual activity: Not Currently    Partners: Female    Birth control/protection: None  Other Topics Concern   Not on file  Social History Narrative   Patient is left handed.   Patient drinks very little caffeine.   Lives alone   Social Determinants of Health   Financial Resource Strain: Not on file  Food Insecurity: Not on file  Transportation Needs: Not on file  Physical Activity: Not on file  Stress: Not on file  Social Connections: Not on file   Past Surgical History:  Procedure Laterality Date   BACK SURGERY  1993   BIOPSY  10/21/2016   Procedure: BIOPSY;  Surgeon: Corbin Ade, MD;  [facility];  Service: Endoscopy;;  gastric, esophagus   CARPAL TUNNEL RELEASE Left    2019   CHOLECYSTECTOMY     ESOPHAGOGASTRODUODENOSCOPY (EGD) WITH PROPOFOL N/A 10/21/2016   Procedure: ESOPHAGOGASTRODUODENOSCOPY (EGD) WITH PROPOFOL;  Surgeon: Corbin Ade, MD;  [facility];  Service: Endoscopy;  Laterality: N/A;  9:30am   GALLBLADDER SURGERY     SHOULDER SURGERY Right    SPINE SURGERY  1993   lumbar disc surgery  Past Surgical History:  Procedure Laterality Date   BACK SURGERY  1993   BIOPSY  10/21/2016   Procedure: BIOPSY;  Surgeon: Corbin Ade, MD;  [facility];  Service: Endoscopy;;  gastric, esophagus   CARPAL TUNNEL RELEASE Left    2019   CHOLECYSTECTOMY     ESOPHAGOGASTRODUODENOSCOPY (EGD) WITH PROPOFOL N/A 10/21/2016   Procedure: ESOPHAGOGASTRODUODENOSCOPY (EGD) WITH PROPOFOL;  Surgeon: Corbin Ade, MD;  [facility];  Service: Endoscopy;  Laterality: N/A;  9:30am   GALLBLADDER SURGERY     SHOULDER SURGERY Right    SPINE SURGERY  1993   lumbar disc surgery   Past Medical History:  Diagnosis Date   Acid reflux    Allergy    pollen, dust   Anxiety    Arthritis    Asthma    Carpal tunnel  syndrome    bilateral   DDD (degenerative disc disease), cervical    Depression    Fibromyalgia    Hypertension    Hypokalemia    Migraine    Palpitations    Panic attacks    PTSD (post-traumatic stress disorder)    Seizures (HCC)    unknown etiology; last seizure was 2 years ago; on meds.   Syncope and collapse    Tennis elbow    Ulcer    Vertigo    BP 115/81   Pulse 95   Ht 5\' 4"  (1.626 m)   Wt 152 lb (68.9 kg)   SpO2 98%   BMI 26.09 kg/m   Opioid Risk Score:   Fall Risk Score:  `1  Depression screen PHQ 2/9     08/05/2022    2:20 PM 07/08/2022    1:35 PM 09/05/2017   11:28 AM 07/23/2017    8:28 AM 03/17/2017    9:44 AM 12/26/2016   10:20 AM 08/14/2016   11:24 AM  Depression screen PHQ 2/9  Decreased Interest 1 3   0 0 0  Down, Depressed, Hopeless 1 1   0 0 0  PHQ - 2 Score 2 4   0 0 0  Altered sleeping  3       Tired, decreased energy  2       Change in appetite  3       Feeling bad or failure about yourself   1       Trouble concentrating  2       Moving slowly or fidgety/restless  2       Suicidal thoughts  0       PHQ-9 Score  17       Difficult doing work/chores  Not difficult at all          Information is confidential and restricted. Go to Review Flowsheets to unlock data.     Review of Systems  Musculoskeletal:  Positive for back pain, gait problem and neck pain.       Pain going down leg  All other systems reviewed and are negative.      Objective:   Physical Exam Gen NAD Mood/affect approp Dentition severe decay and fracture of incisors + FABERs at hips  Neg thigh thrust Neg distraction test Mild pain over greater trochanters Lumbar spine has 50% ROM with flexion and ext  Pain to palpation bilateral lumbar paraspinal area  Neg SLR bilateral Normal DTRs in LEs     Assessment & Plan:   Chronic low back pain which only responded partially to 1  month of PT, only partly relieved by prescription med management with narcotic analgesic.   Pt has mainly axial pain and while imaging studies show  a disc protrusion affection L5 and S1 nerve roots on right , there are no correlating clinical signs or symptoms .  Lumbar films 2021 show lumbar spondylosis will schedule for MBB Bilateral groin pain check xrays to eval for OA vs AVN  Pt to f/u with Dr Benjie Karvonen PMR, [facility] for pain meds

## 2022-08-22 ENCOUNTER — Telehealth: Payer: Self-pay | Admitting: *Deleted

## 2022-08-22 NOTE — Telephone Encounter (Signed)
I have spoken with Ms Claire [name] and informed her of non narcotic status due to the confirmation of presence of illegal substance in her urine drug screen. She says she does not know how it got there because she doesn't do anything illegal. I told her I understand her concerns about how it got there but the decision still stands that she will be non narcotic treatment only.

## 2022-09-06 ENCOUNTER — Ambulatory Visit (HOSPITAL_COMMUNITY): Admission: RE | Admit: 2022-09-06 | Payer: 59 | Source: Ambulatory Visit

## 2022-09-06 ENCOUNTER — Ambulatory Visit (HOSPITAL_COMMUNITY): Payer: 59

## 2022-09-11 ENCOUNTER — Ambulatory Visit (HOSPITAL_BASED_OUTPATIENT_CLINIC_OR_DEPARTMENT_OTHER): Payer: 59 | Admitting: Pulmonary Disease

## 2022-09-25 ENCOUNTER — Ambulatory Visit (HOSPITAL_COMMUNITY)
Admission: RE | Admit: 2022-09-25 | Discharge: 2022-09-25 | Disposition: A | Discharge status: Home / Self Care | Payer: 59 | Source: Ambulatory Visit | Attending: Pulmonary Disease | Admitting: Pulmonary Disease

## 2022-09-25 ENCOUNTER — Other Ambulatory Visit (HOSPITAL_COMMUNITY): Payer: Self-pay

## 2022-09-25 DIAGNOSIS — R911 Solitary pulmonary nodule: Secondary | ICD-10-CM | POA: Diagnosis not present

## 2022-09-25 LAB — GLUCOSE, CAPILLARY: Glucose-Capillary: 94 mg/dL (ref 70–99)

## 2022-09-25 MED ORDER — FLUDEOXYGLUCOSE F - 18 (FDG) INJECTION
7.8000 | Freq: Once | INTRAVENOUS | Status: AC | PRN
  Administered 2022-09-25: 7.8 via INTRAVENOUS

## 2022-09-27 ENCOUNTER — Encounter (HOSPITAL_BASED_OUTPATIENT_CLINIC_OR_DEPARTMENT_OTHER): Payer: Self-pay | Admitting: Pulmonary Disease

## 2022-09-27 ENCOUNTER — Ambulatory Visit (INDEPENDENT_AMBULATORY_CARE_PROVIDER_SITE_OTHER): Payer: 59 | Admitting: Pulmonary Disease

## 2022-09-27 VITALS — BP 114/78 | HR 106 | Temp 98.7°F | Ht 64.0 in | Wt 159.6 lb

## 2022-09-27 DIAGNOSIS — R911 Solitary pulmonary nodule: Secondary | ICD-10-CM

## 2022-09-27 NOTE — Patient Instructions (Signed)
LLL lung nodule 8 mm Reviewed PET/CT. Nodule improving in size. Awaiting final PET/CT read by Radiology Pending results will determine follow-up.

## 2022-09-27 NOTE — Progress Notes (Signed)
Subjective:   PATIENT ID: Claire [name] GENDER: female DOB: 1971-03-10, MRN: [id number]  Chief Complaint  Patient presents with   Follow-up    Review PET scan.  Cough persistent.    Reason for Visit: Follow-up PET/CT  [name] is a 52 year old female with COPD/asthma, seasonal allergies, fibromyalgia, GAD, depression, reflex, migraines, RLS, DDD, HTN, seizures who for follow-up for asthma and lung nodule.  Initial consult After working 10 years in a [employer], she reports diagnosis of asthma in her 62s. Currently followed by Allergy and Asthma and is on Trelegy and Dupixent. Using Albuterol 2-3 times a day for shortness of breath. Has used nebulizer 1-3 times a week. Denies wheezing or frequent coughing. Triggered by strong odors or allergens. Allergy note reviewed and planning to change to a different biologic.   Denies personal history of cancer or family history of lung cancer. Previously worked at a [employer], Systems analyst (wore masks) x 3, [employer]. Also worked at Education officer, museum but no concerning inhalation exposures  06/11/22 Since our last visit she is compliant with Trelegy and Dupixent. Currently not on allergy shots due to disability and affordability. Has not needed rescue inhaler recently. She does wake up coughing every other night and needs the fan running. Denies wheezing. Symptoms are improved since being on Dupixent.  09/27/22 Since our last visit, asthma symptoms have been well controlled on Trelegy and Dupixent, which are being managed by Allergy. She presents today for PET/CT follow-up. Denies shortness of breath, cough or wheezing.   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   Syncope and collapse    Tennis elbow    Ulcer    Vertigo      Family History  Problem Relation Age of Onset   COPD Mother    Bronchitis Mother    Arthritis Mother    Depression Mother    Heart disease Mother    Hypertension Mother    Alcohol abuse Father    Early death Father        GSW   Migraines Sister    Colon cancer Maternal Grandmother    PKU Son    Sleep apnea Neg Hx      Social History   Occupational History   Occupation: disability    Comment: unemployed  Tobacco Use   Smoking status: Never   Smokeless tobacco: Never  Vaping Use   Vaping status: Never Used  Substance and Sexual Activity   Alcohol use: Not Currently    Comment: drink occasionally   Drug use: No   Sexual activity: Not Currently  Partners: Female    Birth control/protection: None    Allergies  Allergen Reactions   Baclofen Shortness Of Breath    Swollen ankles   Duloxetine Hives    Other reaction(s): Other (See Comments) "Hives, forgot who I was"   Penicillins Hives    Has patient had a PCN reaction causing immediate rash, facial/tongue/throat swelling, SOB or lightheadedness with hypotension: Unknown Has patient had a PCN reaction causing severe rash involving mucus membranes or skin necrosis: Yes Has patient had a PCN reaction that required hospitalization: Unknown Has patient had a PCN reaction occurring within the last 10 years: Unknown If all of the above answers are "NO", then may proceed with Cephalosporin use. Other reaction(s): Other (See Comments) Has patient had a PCN reaction causing immediate rash,  facial/tongue/throat swelling, SOB or lightheadedness with hypotension: Unknown Has patient had a PCN reaction causing severe rash involving mucus membranes or skin necrosis: Yes Has patient had a PCN reaction that required hospitalization: Unknown Has patient had a PCN reaction occurring within the last 10 years: Unknown If all of the above answers are "NO", then may proceed with Cephalosporin use.   Sulfa Antibiotics Hives, Nausea And Vomiting and Other (See Comments)    Other Reaction(s): Unknown   Benadryl [Diphenhydramine]     Started throwing up   Gluten Meal    Penicillin V Potassium Other (See Comments)    Other Reaction(s): Unknown   Duloxetine Hcl Hives and Other (See Comments)    "Hives, forgot who I was"  Other Reaction(s): Unknown   Loratadine Other (See Comments)    shaking  Other Reaction(s): Unknown   Ultram [Tramadol] Other (See Comments)    Dizzy     Outpatient Medications Prior to Visit  Medication Sig Dispense Refill   albuterol (PROVENTIL) (2.5 MG/3ML) 0.083% nebulizer solution Take 3 mLs (2.5 mg total) by nebulization every 4 (four) hours as needed for wheezing or shortness of breath. 150 mL 1   albuterol (VENTOLIN HFA) 108 (90 Base) MCG/ACT inhaler Inhale two puffs every 4-6 hours if needed for cough or wheeze. 18 g 1   ARNUITY ELLIPTA 200 MCG/ACT AEPB Take 1 puff by mouth at bedtime.     botulinum toxin Type A (BOTOX) 200 units injection Provider to inject 155 units into the muscles of the head and neck every 12 weeks. Discard remainder. 1 each 1   butalbital-apap-caffeine-codeine (FIORICET WITH CODEINE) 50-325-40-30 MG capsule Take 1 capsule by mouth every 4 (four) hours as needed for headache.     chlorthalidone (HYGROTON) 25 MG tablet Take 25 mg by mouth every morning.     dupilumab (DUPIXENT) 300 MG/2ML prefilled syringe INJECT 300MG  SUBCUTANEOUSLY  EVERY OTHER WEEK 4 mL 11   EPINEPHrine 0.3 mg/0.3 mL IJ SOAJ injection Inject 0.3 mg into the muscle as  needed for anaphylaxis. 1 each 1   escitalopram (LEXAPRO) 20 MG tablet Take 1 tablet (20 mg total) by mouth daily. 90 tablet 0   famotidine (PEPCID) 20 MG tablet Take 20 mg by mouth at bedtime.     hydrOXYzine (ATARAX) 50 MG tablet Take 50 mg by mouth at bedtime as needed.     ibuprofen (ADVIL) 400 MG tablet Take 400 mg by mouth every 8 (eight) hours as needed.     lamoTRIgine (LAMICTAL) 100 MG tablet Take 100 mg by mouth 2 (two) times daily.     levETIRAcetam (KEPPRA) 500 MG tablet TAKE 1 TABLET BY MOUTH TWICE DAILY 180 tablet 3   levocetirizine (  XYZAL) 5 MG tablet TAKE 1 TABLET BY MOUTH ONCE DAILY IN THE EVENING 30 tablet 5   linaclotide (LINZESS) 290 MCG CAPS capsule TAKE 1 CAPSULE BY MOUTH ONCE DAILY 30  MINUTES  BEFORE  BREAKFAST 90 capsule 3   metaxalone (SKELAXIN) 800 MG tablet Take 800 mg by mouth 3 (three) times daily.     methocarbamol (ROBAXIN) 500 MG tablet Take 500 mg by mouth 3 (three) times daily as needed.     metoprolol succinate (TOPROL-XL) 25 MG 24 hr tablet Take 1 tablet (25 mg total) by mouth daily. 90 tablet 3   mirtazapine (REMERON) 45 MG tablet Take 1 tablet (45 mg total) by mouth at bedtime. 90 tablet 0   montelukast (SINGULAIR) 10 MG tablet TAKE 1 TABLET BY MOUTH AT BEDTIME 30 tablet 4   OLANZapine (ZYPREXA) 5 MG tablet Take 5 mg by mouth at bedtime. Take 7.5 mg once daily.     Olopatadine HCl (PATADAY) 0.2 % SOLN Place 1 drop each eye once a day as needed for itchy watery eyes 2.5 mL 5   omeprazole (PRILOSEC) 40 MG capsule TAKE 1 CAPSULE BY MOUTH ONCE DAILY BEFORE BREAKFAST 30 capsule 5   ondansetron (ZOFRAN) 8 MG tablet 1 tablet as needed for nausea Orally every 8 hours as needed for 14 days     oxyCODONE-acetaminophen (PERCOCET) 10-325 MG tablet Take 1 tablet by mouth 3 (three) times daily.     potassium chloride 20 MEQ/15ML (10%) SOLN      rizatriptan (MAXALT) 10 MG tablet      SAVELLA 50 MG TABS tablet Take 50 mg by mouth 2 (two) times daily.     telmisartan  (MICARDIS) 40 MG tablet Take 40 mg by mouth daily.     topiramate (TOPAMAX) 25 MG tablet Take 25 mg by mouth daily.     TRELEGY ELLIPTA 200-62.5-25 MCG/ACT AEPB Inhale 1 puff into the lungs daily. 60 each 5   triamcinolone ointment (KENALOG) 0.1 % APPLY TOPICALLY TWICE DAILY 454 g 0   OLANZapine (ZYPREXA) 10 MG tablet Take 10 mg by mouth at bedtime. (Patient not taking: Reported on 09/27/2022)     No facility-administered medications prior to visit.    Review of Systems  Constitutional:  Negative for chills, diaphoresis, fever, malaise/fatigue and weight loss.  HENT:  Negative for congestion.   Respiratory:  Negative for cough, hemoptysis, sputum production, shortness of breath and wheezing.   Cardiovascular:  Negative for chest pain, palpitations and leg swelling.     Objective:   Vitals:   09/27/22 1307  BP: 114/78  Pulse: (!) 106  Temp: 98.7 F (37.1 C)  TempSrc: Oral  SpO2: 98%  Weight: 159 lb 9.6 oz (72.4 kg)  Height: 5\' 4"  (1.626 m)   SpO2: 98 %  Physical Exam: General: Well-appearing, no acute distress HENT: , AT Eyes: EOMI, no scleral icterus Respiratory: Clear to auscultation bilaterally.  No crackles, wheezing or rales Cardiovascular: RRR, -M/R/G, no JVD Extremities:-Edema,-tenderness Neuro: AAO x4, CNII-XII grossly intact Psych: Normal mood, normal affect  Data Reviewed:  Imaging: CT Chest 11/28/21 - 7 mm ill-defined pleural based nodular density along lateral left lung base CXR 03/13/22 - No infiltrate effusion or edema CT Chest 06/06/21 - 1.1 mm pleural based lung nodule in the left lateral base PET/CT 09/25/22 - LLL less defined with no significant metabolic activity. RUL opacity with metabolic activity likely inflammatory  PFT: Spirometry 12/28/21 FVC 3.10 (92%) FEV1 2.57 (96%) Ratio 83   Interpretation: Normal  spirometry  Spirometry 03/13/22 FVC 3.24 (104%) FEV1 2.54 (102%) Ratio 78   Interpretation: Normal spirometry  Labs: CBC    Component  Value Date/Time   WBC 7.2 08/24/2021 1405   RBC 4.41 08/24/2021 1405   HGB 14.5 08/24/2021 1405   HGB 14.0 05/13/2019 1531   HCT 41.8 08/24/2021 1405   HCT 38.9 05/13/2019 1531   PLT 391 08/24/2021 1405   PLT 427 05/13/2019 1531   MCV 94.8 08/24/2021 1405   MCV 92 05/13/2019 1531   MCH 32.9 08/24/2021 1405   MCHC 34.7 08/24/2021 1405   RDW 11.9 08/24/2021 1405   RDW 12.1 05/13/2019 1531   LYMPHSABS 2.6 05/13/2019 1531   MONOABS 0.5 08/09/2016 0956   EOSABS 0.2 05/13/2019 1531   BASOSABS 0.1 05/13/2019 1531   Absolute eos 05/13/19 - 200     Assessment & Plan:   Discussion: 52 year old female with COPD/asthma, seasonal allergies, fibromyalgia, GAD, depression, reflex, migraines, RLS, DDD, HTN, seizures who for follow-up pulmonary nodule. Reviewed PET/CT and addressed questions and concerns. Contacted patient after visit with final radiology report. Less likely representative of malignancy at this point and recommend against invasive testing. Patient still expressing concern about complete resolution. Reasonable to pursue repeat CT in future.   LLL lung nodule  Reviewed PET/CT. Less discernible, no metabolic activity. Not likely malignant No indication for biopsy Order CT at next visit for complete resolution  Severe persistent asthma Managed by Allergy CONTINUE Trelegy CONTINUE Albuterol as needed CONTINUE nebulizer as needed CONTINUE Dupixent   Health Maintenance Immunization History  Administered Date(s) Administered   Influenza Split 05/11/2014   Influenza, Quadrivalent, Recombinant, Inj, Pf 12/17/2018   Influenza, Seasonal, Injecte, Preservative Fre 12/22/2019   Influenza,inj,Quad PF,6+ Mos 11/12/2016   Influenza-Unspecified 11/12/2016, 04/13/2018, 12/26/2018   Tdap 11/12/2016   CT Lung Screen - never smoker. Not qualified  No orders of the defined types were placed in this encounter.  No orders of the defined types were placed in this encounter.   Return in  about 6 months (around 03/30/2023) for routine follow-up.  I have spent a total time of 25-minutes on the day of the appointment including chart review, data review, collecting history, coordinating care and discussing medical diagnosis and plan with the patient/family. Past medical history, allergies, medications were reviewed. Pertinent imaging, labs and tests included in this note have been reviewed and interpreted independently by me.  Mattheus Rauls Mechele Collin, MD [facility] 09/27/2022 1:15 PM

## 2022-10-01 ENCOUNTER — Encounter: Payer: 59 | Admitting: Physical Medicine & Rehabilitation

## 2022-10-04 ENCOUNTER — Encounter: Payer: 59 | Admitting: Physical Medicine & Rehabilitation

## 2022-10-09 ENCOUNTER — Other Ambulatory Visit (HOSPITAL_COMMUNITY): Payer: Self-pay

## 2022-11-07 ENCOUNTER — Encounter: Payer: Self-pay | Admitting: Physical Medicine & Rehabilitation

## 2022-11-07 ENCOUNTER — Encounter: Payer: 59 | Attending: Physical Medicine & Rehabilitation | Admitting: Physical Medicine & Rehabilitation

## 2022-11-07 VITALS — Temp 97.9°F | Ht 64.0 in | Wt 156.0 lb

## 2022-11-07 DIAGNOSIS — M47817 Spondylosis without myelopathy or radiculopathy, lumbosacral region: Secondary | ICD-10-CM | POA: Diagnosis present

## 2022-11-07 MED ORDER — LIDOCAINE HCL (PF) 2 % IJ SOLN
4.0000 mL | Freq: Once | INTRAMUSCULAR | Status: AC
  Administered 2022-11-07: 4 mL

## 2022-11-07 MED ORDER — LIDOCAINE HCL 1 % IJ SOLN
10.0000 mL | Freq: Once | INTRAMUSCULAR | Status: AC
  Administered 2022-11-07: 10 mL

## 2022-11-07 MED ORDER — IOHEXOL 180 MG/ML  SOLN
3.0000 mL | Freq: Once | INTRAMUSCULAR | Status: AC
  Administered 2022-11-07: 3 mL

## 2022-11-07 NOTE — Patient Instructions (Signed)

## 2022-11-07 NOTE — Progress Notes (Signed)
  PROCEDURE RECORD [facility]   Name: Claire [name] DOB:05/23/1970 MRN: [id number]  Date:11/07/2022  Physician: Claudette Laws, MD    Nurse/CMA: Jarah Pember S  Allergies:  Allergies  Allergen Reactions   Baclofen Shortness Of Breath    Swollen ankles   Duloxetine Hives    Other reaction(s): Other (See Comments) "Hives, forgot who I was"   Penicillins Hives    Has patient had a PCN reaction causing immediate rash, facial/tongue/throat swelling, SOB or lightheadedness with hypotension: Unknown Has patient had a PCN reaction causing severe rash involving mucus membranes or skin necrosis: Yes Has patient had a PCN reaction that required hospitalization: Unknown Has patient had a PCN reaction occurring within the last 10 years: Unknown If all of the above answers are "NO", then may proceed with Cephalosporin use. Other reaction(s): Other (See Comments) Has patient had a PCN reaction causing immediate rash, facial/tongue/throat swelling, SOB or lightheadedness with hypotension: Unknown Has patient had a PCN reaction causing severe rash involving mucus membranes or skin necrosis: Yes Has patient had a PCN reaction that required hospitalization: Unknown Has patient had a PCN reaction occurring within the last 10 years: Unknown If all of the above answers are "NO", then may proceed with Cephalosporin use.   Sulfa Antibiotics Hives, Nausea And Vomiting and Other (See Comments)    Other Reaction(s): Unknown   Benadryl [Diphenhydramine]     Started throwing up   Gluten Meal    Penicillin V Potassium Other (See Comments)    Other Reaction(s): Unknown   Duloxetine Hcl Hives and Other (See Comments)    "Hives, forgot who I was"  Other Reaction(s): Unknown   Loratadine Other (See Comments)    shaking  Other Reaction(s): Unknown   Ultram [Tramadol] Other (See Comments)    Dizzy    Consent Signed: Yes.    Is patient diabetic? No.  CBG today?  na  Pregnant: No. LMP: No LMP recorded. Patient is perimenopausal. (age 61-55)  Anticoagulants: no Anti-inflammatory: no Antibiotics: no  Procedure: Bilateral Medial Branch Block  Position:Prone Start Time: 1:34  End Time: 1:42   Fluoro Time: 38  RN/CMA Iszabella Hebenstreit S Monisha Siebel S    Time 12:52 1:42    BP 125/87 137/91    Pulse 100 86    Respirations 16 16    O2 Sat 98 97    S/S 6 6    Pain Level 7 7.5     D/C home with Mitzie Na, patient A & O X 3, D/C instructions reviewed, and sits independently.

## 2022-11-07 NOTE — Progress Notes (Signed)
Bilateral Lumbar L3, L4  medial branch blocks and L 5 dorsal ramus injection under fluoroscopic guidance  Indication: Lumbar pain which is not relieved by medication management or other conservative care and interfering with self-care and mobility.  Informed consent was obtained after describing risks and benefits of the procedure with the patient, this includes bleeding, infection, paralysis and medication side effects.  The patient wishes to proceed and has given written consent.  The patient was placed in prone position.  The lumbar area was marked and prepped with Betadine.  One mL of 1% lidocaine was injected into each of 6 areas into the skin and subcutaneous tissue.  Then a 22-gauge 3.5inch spinal needle was inserted targeting the junction of the left S1 superior articular process and sacral ala junction. Needle was advanced under fluoroscopic guidance.  Bone contact was made.Omnipaque 180 was injected x 0.5 mL demonstrating no intravascular uptake.  Then a solution  of 2% MPF lidocaine was injected x 0.5 mL.  Then the left L5 superior articular process in transverse process junction was targeted.  Bone contact was made. Omnipaque 180 was injected x 0.5 mL demonstrating no intravascular uptake. Then a solution containing  2% MPF lidocaine was injected x 0.5 mL.  Then the left L4 superior articular process in transverse process junction was targeted.  Bone contact was made. Omnipaque 180 was injected x 0.5 mL demonstrating no intravascular uptake.  Then a solution containing2% MPF lidocaine was injected x 0.5 mL.  This same procedure was performed on the right side using the same needle, technique and injectate.  Patient tolerated procedure well.  Post procedure instructions were given.   Lidocaine 1% with preservative multidose, 10ml no waste Lidocaine 2% MPF, 5ml bottle, 3ml used 2ml waste Omnipaque 180 1.5 ml used, 8.5 ml waste

## 2022-11-07 NOTE — Progress Notes (Deleted)
  PROCEDURE RECORD [facility]   Name: Claire [name] DOB:1970-10-04 MRN: [id number]  Date:11/07/2022  Physician: Claudette Laws, MD    Nurse/CMA: Precious Bard CMA  Allergies:  Allergies  Allergen Reactions   Baclofen Shortness Of Breath    Swollen ankles   Duloxetine Hives    Other reaction(s): Other (See Comments) "Hives, forgot who I was"   Penicillins Hives    Has patient had a PCN reaction causing immediate rash, facial/tongue/throat swelling, SOB or lightheadedness with hypotension: Unknown Has patient had a PCN reaction causing severe rash involving mucus membranes or skin necrosis: Yes Has patient had a PCN reaction that required hospitalization: Unknown Has patient had a PCN reaction occurring within the last 10 years: Unknown If all of the above answers are "NO", then may proceed with Cephalosporin use. Other reaction(s): Other (See Comments) Has patient had a PCN reaction causing immediate rash, facial/tongue/throat swelling, SOB or lightheadedness with hypotension: Unknown Has patient had a PCN reaction causing severe rash involving mucus membranes or skin necrosis: Yes Has patient had a PCN reaction that required hospitalization: Unknown Has patient had a PCN reaction occurring within the last 10 years: Unknown If all of the above answers are "NO", then may proceed with Cephalosporin use.   Sulfa Antibiotics Hives, Nausea And Vomiting and Other (See Comments)    Other Reaction(s): Unknown   Benadryl [Diphenhydramine]     Started throwing up   Gluten Meal    Penicillin V Potassium Other (See Comments)    Other Reaction(s): Unknown   Duloxetine Hcl Hives and Other (See Comments)    "Hives, forgot who I was"  Other Reaction(s): Unknown   Loratadine Other (See Comments)    shaking  Other Reaction(s): Unknown   Ultram [Tramadol] Other (See Comments)    Dizzy    Consent Signed: {yes [id number]}  Is patient diabetic? No.  CBG today?    Pregnant: {yes no:[id number]} LMP: No LMP recorded. Patient is perimenopausal. (age 51-55)  Anticoagulants: {Yes/No:19989} Anti-inflammatory: {Yes/No:19989} Antibiotics: {Yes/No:19989}  Procedure: Bilateral Medical Branch Block  Position: Prone Start Time: ***  End Time: ***  Fluoro Time: ***  RN/CMA Snipes CMA Snipes CMA    Time *** ***    BP *** ***    Pulse *** ***    Respirations *** ***    O2 Sat *** ***    S/S *** ***    Pain Level *** ***     D/C home with ***, patient A & O X 3, D/C instructions reviewed, and sits independently.

## 2022-11-15 ENCOUNTER — Other Ambulatory Visit: Payer: Self-pay | Admitting: Allergy & Immunology

## 2022-11-26 ENCOUNTER — Other Ambulatory Visit: Payer: Self-pay | Admitting: Allergy & Immunology

## 2022-12-09 ENCOUNTER — Encounter: Payer: 59 | Attending: Physical Medicine & Rehabilitation | Admitting: Physical Medicine & Rehabilitation

## 2022-12-09 DIAGNOSIS — M47817 Spondylosis without myelopathy or radiculopathy, lumbosacral region: Secondary | ICD-10-CM | POA: Insufficient documentation

## 2022-12-11 ENCOUNTER — Encounter: Payer: Self-pay | Admitting: Internal Medicine

## 2022-12-21 ENCOUNTER — Other Ambulatory Visit: Payer: Self-pay | Admitting: Allergy & Immunology

## 2023-01-16 ENCOUNTER — Encounter: Payer: 59 | Attending: Physical Medicine & Rehabilitation | Admitting: Physical Medicine & Rehabilitation

## 2023-01-16 ENCOUNTER — Encounter: Payer: Self-pay | Admitting: Physical Medicine & Rehabilitation

## 2023-01-16 VITALS — BP 126/87 | HR 88 | Ht 64.0 in | Wt 159.8 lb

## 2023-01-16 DIAGNOSIS — F319 Bipolar disorder, unspecified: Secondary | ICD-10-CM | POA: Diagnosis present

## 2023-01-16 DIAGNOSIS — G8929 Other chronic pain: Secondary | ICD-10-CM

## 2023-01-16 DIAGNOSIS — G894 Chronic pain syndrome: Secondary | ICD-10-CM | POA: Diagnosis present

## 2023-01-16 DIAGNOSIS — M797 Fibromyalgia: Secondary | ICD-10-CM

## 2023-01-16 DIAGNOSIS — M5441 Lumbago with sciatica, right side: Secondary | ICD-10-CM

## 2023-01-16 MED ORDER — SAVELLA 100 MG PO TABS: 100.0000 mg | ORAL_TABLET | Freq: Two times a day (BID) | ORAL | 0 refills | Status: DC

## 2023-01-16 NOTE — Progress Notes (Signed)
Subjective:    Patient ID: Claire [name], female    DOB: Mar 17, 1971, 52 y.o.   MRN: [id number]  HPI HPI 08/05/2022   Claire [name] is a 52 y.o. year old female  who  has a past medical history of Acid reflux, Allergy, Anxiety, Arthritis, Asthma, Carpal tunnel syndrome, DDD (degenerative disc disease), cervical, Depression, Fibromyalgia, Hypertension, Hypokalemia, Migraine, Palpitations, Panic attacks, PTSD (post-traumatic stress disorder), Seizures, Syncope and collapse, Tennis elbow, Ulcer, and Vertigo.   They are presenting to PM&R clinic as a new patient for pain management evaluation.  Claire [name] reports she has had severe pain for many years.  She reports she had a laminectomy many years ago of her lumbar spine due to a ruptured disc. She has had back pain since this time that is gradually worsening.  She stopped working in 2017 and has been on disability since 2018.  She was also diagnosed with fibromyalgia several years ago.  Her worst pain is in her lower back however she also has pain throughout her whole body.  She says she is having significant issues with sciatica shooting down her right leg to the ankle and bottom of her foot.  She feels often like she has just been in a car wreck, has electrical pain in her arms and legs.  She also reports having fibromyalgia hangovers where she has fatigue after episodes of pain.  Additionally she reports pain due to osteoarthritis in her knees.  She has had shoulder surgeries bilaterally in the past. Back pain since that time that's worsening.  She was initially seen by [facility] and is now followed by [facility] pain management.  She says that she changed from [facility] pain to [facility] pain management to get additional options.  She denies discharge from [facility] pain management and has an appointment scheduled but she would like to switch to [facility] for this service.       Red flag symptoms: No red flags for back pain endorsed in Hx or ROS    Medications tried: Topical medications -  denies benefit from voltaren  Nsaids 800 ibuprofen helps sometimes Tylenol  - doesn't help by itself  Hydrocodone- 5mg  helped in past Oxycodone 5mg  TID Buprenorphine- didn't help Milnacipran -currently taking and feels like this is helping her fibromyalgia   Gabapentin / Lyrica gabapentin and lyrica didn't help TCAs - not sure  SNRIs  - doesn't recal   Other treatments: PT/OT  - Hasn't tried for lower back  TENs unit-uses this  Surgery shoulder and lumbar spine laminectomy   Interval history 08/05/2022 Patient is here for follow-up regarding her chronic pain.  She is continue to follow with [facility] for her pain medications until I begin to prescribe her medications.  She was recently refilled Percocet 5 #90 tabs.  She reports has been helping her pain and not causing significant side effects.  We did discuss her UDS which noted methamphetamine.  I also have obtain records from [facility] center and appears that her most recent UDS from last week also had this compound.  Patient reports she is working with physical therapy and her pain is getting better.  Patient did have methamphetamine noted on UDS.  Patient denies use of any of these compounds.  She does use inhalers.   Interval history 08/05/2022 Claire [name] is here for follow-up regarding her chronic pain.  She had pain throughout her body there is particular bad in her lower back.  She reports  she is no longer followed by [facility] and had a disagreement with the provider that she was seeing regarding her UDS.  UDS completed at order of this clinic earlier this year showed methamphetamine.  She is no longer using oxycodone and pain has been particularly severe since this time.  She says she does not use illicit drugs, admits to using CBD product she purchased.  Patient had medial branch blocks lumbar spine reports she had maximum of 25% improvement at about 4 days  after the procedure.  This improvement only lasted for a few days.    Pain Inventory Average Pain 8 Pain Right Now 9 My pain is sharp, burning, stabbing, tingling, and aching  In the last 24 hours, has pain interfered with the following? General activity 9 Relation with others 9 Enjoyment of life 9 What TIME of day is your pain at its worst? morning , daytime, evening, and night Sleep (in general) Poor  Pain is worse with: walking, bending, sitting, inactivity, standing, and some activites Pain improves with:  not specified Relief from Meds:  not specified  Family History  Problem Relation Age of Onset   COPD Mother    Bronchitis Mother    Arthritis Mother    Depression Mother    Heart disease Mother    Hypertension Mother    Alcohol abuse Father    Early death Father        GSW   Migraines Sister    Colon cancer Maternal Grandmother    PKU Son    Sleep apnea Neg Hx    Social History   Socioeconomic History   Marital status: Single    Spouse name: Not on file   Number of children: 1   Years of education: HS   Highest education level: Not on file  Occupational History   Occupation: disability    Comment: unemployed  Tobacco Use   Smoking status: Never   Smokeless tobacco: Never  Vaping Use   Vaping status: Never Used  Substance and Sexual Activity   Alcohol use: Not Currently    Comment: drink occasionally   Drug use: No   Sexual activity: Not Currently    Partners: Female    Birth control/protection: None  Other Topics Concern   Not on file  Social History Narrative   Patient is left handed.   Patient drinks very little caffeine.   Lives alone   Social Determinants of Health   Financial Resource Strain: Not on file  Food Insecurity: Not on file  Transportation Needs: Not on file  Physical Activity: Not on file  Stress: Not on file  Social Connections: Not on file   Past Surgical History:  Procedure Laterality Date   BACK SURGERY  1993    BIOPSY  10/21/2016   Procedure: BIOPSY;  Surgeon: Corbin Ade, MD;  [facility];  Service: Endoscopy;;  gastric, esophagus   CARPAL TUNNEL RELEASE Left    2019   CHOLECYSTECTOMY     ESOPHAGOGASTRODUODENOSCOPY (EGD) WITH PROPOFOL N/A 10/21/2016   Procedure: ESOPHAGOGASTRODUODENOSCOPY (EGD) WITH PROPOFOL;  Surgeon: Corbin Ade, MD;  [facility];  Service: Endoscopy;  Laterality: N/A;  9:30am   GALLBLADDER SURGERY     SHOULDER SURGERY Right    SPINE SURGERY  1993   lumbar disc surgery   Past Surgical History:  Procedure Laterality Date   BACK SURGERY  1993   BIOPSY  10/21/2016   Procedure: BIOPSY;  Surgeon: Jena Gauss,  Gerrit Friends, MD;  [facility];  Service: Endoscopy;;  gastric, esophagus   CARPAL TUNNEL RELEASE Left    2019   CHOLECYSTECTOMY     ESOPHAGOGASTRODUODENOSCOPY (EGD) WITH PROPOFOL N/A 10/21/2016   Procedure: ESOPHAGOGASTRODUODENOSCOPY (EGD) WITH PROPOFOL;  Surgeon: Corbin Ade, MD;  [facility];  Service: Endoscopy;  Laterality: N/A;  9:30am   GALLBLADDER SURGERY     SHOULDER SURGERY Right    SPINE SURGERY  1993   lumbar disc surgery   Past Medical History:  Diagnosis Date   Acid reflux    Allergy    pollen, dust   Anxiety    Arthritis    Asthma    Carpal tunnel syndrome    bilateral   DDD (degenerative disc disease), cervical    Depression    Fibromyalgia    Hypertension    Hypokalemia    Migraine    Palpitations    Panic attacks    PTSD (post-traumatic stress disorder)    Seizures (HCC)    unknown etiology; last seizure was 2 years ago; on meds.   Syncope and collapse    Tennis elbow    Ulcer    Vertigo    BP 126/87   Pulse 88   Ht 5\' 4"  (1.626 m)   Wt 159 lb 12.8 oz (72.5 kg)   SpO2 97%   BMI 27.43 kg/m   Opioid Risk Score:   Fall Risk Score:  `1  Depression screen PHQ 2/9     01/16/2023    1:18 PM 08/05/2022    2:20 PM 07/08/2022    1:35 PM 09/05/2017   11:28 AM 07/23/2017    8:28 AM  03/17/2017    9:44 AM 12/26/2016   10:20 AM  Depression screen PHQ 2/9  Decreased Interest 3 1 3    0 0  Down, Depressed, Hopeless 3 1 1    0 0  PHQ - 2 Score 6 2 4    0 0  Altered sleeping   3      Tired, decreased energy   2      Change in appetite   3      Feeling bad or failure about yourself    1      Trouble concentrating   2      Moving slowly or fidgety/restless   2      Suicidal thoughts   0      PHQ-9 Score   17      Difficult doing work/chores   Not difficult at all         Information is confidential and restricted. Go to Review Flowsheets to unlock data.     Review of Systems  Constitutional: Negative.   HENT: Negative.    Eyes: Negative.   Respiratory: Negative.    Cardiovascular: Negative.   Gastrointestinal:  Positive for constipation.  Endocrine: Negative.   Genitourinary: Negative.   Musculoskeletal:  Positive for arthralgias, back pain and neck pain.  Skin: Negative.   Allergic/Immunologic: Negative.   Neurological: Negative.   Hematological: Negative.   Psychiatric/Behavioral:  Positive for dysphoric mood.   All other systems reviewed and are negative.      Objective:   Physical Exam   Gen: no distress HEENT: oral mucosa pink and moist, NCAT Chest: normal effort, normal rate of breathing Abd: soft, non-distended Ext: no edema Psych: pleasant, normal affect Skin: intact Neuro: Alert and awake, follows commands, cranial nerves II through XII grossly intact,  normal speech and language No focal motor deficits Sensation  touch intact in all 4 extremities, however reports altered sensation throughout her arms and legs SLR pos bilaterally  musculoskeletal:  Tenderness to palpation throughout bilateral lumbar and cervical spine paraspinals, shoulders, elbows, wrist, hips, knees, ankles. Diffuse tenderness throughout b/l UE and LE Greatest tenderness over lumbar paraspinal muscles        MRI  L spine 'IMPRESSION: 09/03/2018 1. Small right  subarticular disc extrusion with slight superior migration at L5-S1, potentially affecting either the exiting right L5 or descending S1 nerve roots. This is increased in size relative to 2014. 2. Progressive degenerative spondylolysis elsewhere within the lumbar spine with resultant mild spinal stenosis at L3-4. Mild bilateral L4 and L5 foraminal narrowing. 3. Sequelae of prior left laminotomy at L4-5 without residual or recurrent stenosis. Mild clumping of the nerve roots of the cauda equina at this level suggestive of mild arachnoiditis, similar to previous.      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 Jodean Lima 8/22, 25% improvement   Fibromyalgia- Suspect this is contributing to her body wide pain and she appears to be tender diffusely in her upper and lower extremities and back -Increase Milnacipran from 50mg  BID- to 100mg  BID -Refer to Aquatic Therapy   Bipolar Disorder -Continue to follow with psychiatry   Migraine headaches -Botox helping   Bilateral knee OA -She reports outside x-rays that showed knee OA.

## 2023-01-17 ENCOUNTER — Other Ambulatory Visit: Payer: Self-pay | Admitting: Family

## 2023-02-04 NOTE — Progress Notes (Unsigned)
Virtual Visit via Video Note  I connected with Claire [name] on 02/05/23 at  1:45 PM EST by a video enabled telemedicine application and verified that I am speaking with the correct person using two identifiers.  Location: Patient: at her home Provider: in the office    I discussed the limitations of evaluation and management by telemedicine and the availability of in person appointments. The patient expressed understanding and agreed to proceed.  PATIENT: Claire [name] DOB: Oct 13, 1970  REASON FOR VISIT: follow up HISTORY FROM: patient Primary Neurologist: Dr. Frances Furbish  1.  Seizure-like spells -No further spells since starting Keppra in 2014, presented as a blackout events -Continue Keppra 500 mg twice a day, also on Lamictal 100 mg twice daily for mood management -Call for spells, follow-up in 1 year virtually   2.  Chronic migraine headaches -Last Botox was in April 2024, had excellent benefit.  Migraines remain under good control.  If she wishes to restart Botox she will let us know -MRI of the brain with and without contrast was normal in 2014, has been reordered but not yet completed   HISTORY OF PRESENT ILLNESS: Today 02/05/23 Here via VV. Last Botox was in April 2024 with Dr. Frances Furbish (was her 3rd cycle). Had excellent benefit with Botox. Lately has been having dental work, causing more migraines, but has known triggers lately. No seizures, remains on Keppra. Also on Lamictal 100 mg BID, for mood.is tapering odd Lithium due to side effect. Essentially migraines are under good control for right now. Having back issues, going to PMR. Is off oxycodone now.   01/24/22 SS: Here today for follow-up for seizures.  Remains on Keppra.  Saw Dr. Frances Furbish 01/08/2022 for migraine headaches.  Planning to arrange for Botox, scheduled next week. Black out spells started in 2014, Dr. Anne Hahn put on Keppra for empiric trial and no further spells so we have left Keppra alone. Also on Lamictal 100 mg twice  daily for the mood stabilization. Has PTSD, anxiety, panic disorder. EEG was normal in May 2014. MRI brain was normal. NCV/EMG was normal May 2016. Remains on mental heath counseling, which is helping. Wishes to remain on Keppra.   Update 08/24/20 SS: Claire [name] is a 52 year old female with history of fibromyalgia and seizure-like episodes.  Is on Keppra.  Seizure-like events are described as confusion, amnesia for 30 to 60 minutes.  No further spells in the last year.  Denies any side effects of Keppra.  Sees pain management, for fibro and chronic low back pain.  Uses a cane to ambulate.  Lives alone, drives a car, lives in [place].  Had left rotator cuff surgery in April, remains in PT.  Has had several GI illnesses this year, has lost 20 pounds.  Here today for evaluation unaccompanied.  HISTORY  08/24/2019 Dr. Anne Hahn: Claire [name] is a 52 year old left-handed [place] female with a history of fibromyalgia and seizure-like episodes in the past.  The patient was last seen through this office in July 2018 for this reason.  The patient was on Keppra and had a good response to the medication, she had not had any events while on the medication.  It appears that she stopped the medication when she had no insurance and could not afford the drug and then began having seizure type events again.  Last such event was 2 weeks ago.  The patient will have events where she becomes confused, she has amnesia for 30 to 60 minutes.  She  does not have generalized jerking or tongue biting or bowel and bladder incontinence.  The patient is still operating a motor vehicle apparently.  She is followed through a pain center for her fibromyalgia.  She is having troubles with gait instability, she has chronic low back pain, she uses a cane to get around and she will occasionally fall.  She does not drink alcohol.  She comes to this office for an evaluation.  REVIEW OF SYSTEMS: Out of a complete 14 system review of symptoms, the patient complains  only of the following symptoms, and all other reviewed systems are negative.  See HPI  ALLERGIES: Allergies  Allergen Reactions   Baclofen Shortness Of Breath    Swollen ankles   Duloxetine Hives    Other reaction(s): Other (See Comments) "Hives, forgot who I was"   Penicillins Hives    Has patient had a PCN reaction causing immediate rash, facial/tongue/throat swelling, SOB or lightheadedness with hypotension: Unknown Has patient had a PCN reaction causing severe rash involving mucus membranes or skin necrosis: Yes Has patient had a PCN reaction that required hospitalization: Unknown Has patient had a PCN reaction occurring within the last 10 years: Unknown If all of the above answers are "NO", then may proceed with Cephalosporin use. Other reaction(s): Other (See Comments) Has patient had a PCN reaction causing immediate rash, facial/tongue/throat swelling, SOB or lightheadedness with hypotension: Unknown Has patient had a PCN reaction causing severe rash involving mucus membranes or skin necrosis: Yes Has patient had a PCN reaction that required hospitalization: Unknown Has patient had a PCN reaction occurring within the last 10 years: Unknown If all of the above answers are "NO", then may proceed with Cephalosporin use.   Sulfa Antibiotics Hives, Nausea And Vomiting and Other (See Comments)    Other Reaction(s): Unknown   Benadryl [Diphenhydramine]     Started throwing up   Gluten Meal    Penicillin V Potassium Other (See Comments)    Other Reaction(s): Unknown   Duloxetine Hcl Hives and Other (See Comments)    "Hives, forgot who I was"  Other Reaction(s): Unknown   Loratadine Other (See Comments)    shaking  Other Reaction(s): Unknown   Ultram [Tramadol] Other (See Comments)    Dizzy    HOME MEDICATIONS: Outpatient Medications Prior to Visit  Medication Sig Dispense Refill   albuterol (PROVENTIL) (2.5 MG/3ML) 0.083% nebulizer solution Take 3 mLs (2.5 mg total) by  nebulization every 4 (four) hours as needed for wheezing or shortness of breath. 150 mL 1   albuterol (VENTOLIN HFA) 108 (90 Base) MCG/ACT inhaler Inhale two puffs every 4-6 hours if needed for cough or wheeze. 18 g 1   ARNUITY ELLIPTA 200 MCG/ACT AEPB Take 1 puff by mouth at bedtime.     botulinum toxin Type A (BOTOX) 200 units injection Provider to inject 155 units into the muscles of the head and neck every 12 weeks. Discard remainder. 1 each 1   butalbital-apap-caffeine-codeine (FIORICET WITH CODEINE) 50-325-40-30 MG capsule Take 1 capsule by mouth every 4 (four) hours as needed for headache.     chlorthalidone (HYGROTON) 25 MG tablet Take 25 mg by mouth every morning.     dupilumab (DUPIXENT) 300 MG/2ML prefilled syringe INJECT 300MG  SUBCUTANEOUSLY  EVERY OTHER WEEK 4 mL 11   EPINEPHrine 0.3 mg/0.3 mL IJ SOAJ injection Inject 0.3 mg into the muscle as needed for anaphylaxis. 1 each 1   famotidine (PEPCID) 20 MG tablet Take 20 mg by mouth at  bedtime.     hydrOXYzine (ATARAX) 50 MG tablet Take 50 mg by mouth at bedtime as needed.     lamoTRIgine (LAMICTAL) 100 MG tablet Take 100 mg by mouth 2 (two) times daily.     levETIRAcetam (KEPPRA) 500 MG tablet TAKE 1 TABLET BY MOUTH TWICE DAILY 180 tablet 3   levocetirizine (XYZAL) 5 MG tablet TAKE 1 TABLET BY MOUTH ONCE DAILY IN THE EVENING 30 tablet 5   linaclotide (LINZESS) 290 MCG CAPS capsule TAKE 1 CAPSULE BY MOUTH ONCE DAILY 30  MINUTES  BEFORE  BREAKFAST 90 capsule 3   lithium carbonate (LITHOBID) 300 MG ER tablet Take 300 mg by mouth 2 (two) times daily.     metaxalone (SKELAXIN) 800 MG tablet Take 800 mg by mouth 3 (three) times daily.     methocarbamol (ROBAXIN) 500 MG tablet Take 500 mg by mouth 3 (three) times daily as needed.     metoprolol succinate (TOPROL-XL) 25 MG 24 hr tablet Take 1 tablet (25 mg total) by mouth daily. 90 tablet 3   Milnacipran HCl (SAVELLA) 100 MG TABS tablet Take 1 tablet (100 mg total) by mouth 2 (two) times daily.  60 tablet 0   mirtazapine (REMERON) 45 MG tablet Take 1 tablet (45 mg total) by mouth at bedtime. 90 tablet 0   montelukast (SINGULAIR) 10 MG tablet TAKE 1 TABLET BY MOUTH AT BEDTIME 30 tablet 0   Olopatadine HCl (PATADAY) 0.2 % SOLN Place 1 drop each eye once a day as needed for itchy watery eyes 2.5 mL 5   omeprazole (PRILOSEC) 40 MG capsule TAKE 1 CAPSULE BY MOUTH ONCE DAILY BEFORE BREAKFAST 30 capsule 5   ondansetron (ZOFRAN) 8 MG tablet 1 tablet as needed for nausea Orally every 8 hours as needed for 14 days     potassium chloride 20 MEQ/15ML (10%) SOLN      rizatriptan (MAXALT) 10 MG tablet      telmisartan (MICARDIS) 40 MG tablet Take 40 mg by mouth daily.     topiramate (TOPAMAX) 25 MG tablet Take 25 mg by mouth daily.     TRELEGY ELLIPTA 200-62.5-25 MCG/ACT AEPB Inhale 1 puff into the lungs daily. 60 each 5   triamcinolone ointment (KENALOG) 0.1 % APPLY TOPICALLY TWICE DAILY 454 g 0   No facility-administered medications prior to visit.    PAST MEDICAL HISTORY: Past Medical History:  Diagnosis Date   Acid reflux    Allergy    pollen, dust   Anxiety    Arthritis    Asthma    Carpal tunnel syndrome    bilateral   DDD (degenerative disc disease), cervical    Depression    Fibromyalgia    Hypertension    Hypokalemia    Migraine    Palpitations    Panic attacks    PTSD (post-traumatic stress disorder)    Seizures (HCC)    unknown etiology; last seizure was 2 years ago; on meds.   Syncope and collapse    Tennis elbow    Ulcer    Vertigo     PAST SURGICAL HISTORY: Past Surgical History:  Procedure Laterality Date   BACK SURGERY  1993   BIOPSY  10/21/2016   Procedure: BIOPSY;  Surgeon: Corbin Ade, MD;  [facility];  Service: Endoscopy;;  gastric, esophagus   CARPAL TUNNEL RELEASE Left    2019   CHOLECYSTECTOMY     ESOPHAGOGASTRODUODENOSCOPY (EGD) WITH PROPOFOL N/A 10/21/2016   Procedure: ESOPHAGOGASTRODUODENOSCOPY (EGD)  WITH PROPOFOL;  Surgeon:  Corbin Ade, MD;  [facility];  Service: Endoscopy;  Laterality: N/A;  9:30am   GALLBLADDER SURGERY     SHOULDER SURGERY Right    SPINE SURGERY  1993   lumbar disc surgery    FAMILY HISTORY: Family History  Problem Relation Age of Onset   COPD Mother    Bronchitis Mother    Arthritis Mother    Depression Mother    Heart disease Mother    Hypertension Mother    Alcohol abuse Father    Early death Father        GSW   Migraines Sister    Colon cancer Maternal Grandmother    PKU Son    Sleep apnea Neg Hx     SOCIAL HISTORY: Social History   Socioeconomic History   Marital status: Single    Spouse name: Not on file   Number of children: 1   Years of education: HS   Highest education level: Not on file  Occupational History   Occupation: disability    Comment: unemployed  Tobacco Use   Smoking status: Never   Smokeless tobacco: Never  Vaping Use   Vaping status: Never Used  Substance and Sexual Activity   Alcohol use: Not Currently    Comment: drink occasionally   Drug use: No   Sexual activity: Not Currently    Partners: Female    Birth control/protection: None  Other Topics Concern   Not on file  Social History Narrative   Patient is left handed.   Patient drinks very little caffeine.   Lives alone   Social Determinants of Health   Financial Resource Strain: Not on file  Food Insecurity: Not on file  Transportation Needs: Not on file  Physical Activity: Not on file  Stress: Not on file  Social Connections: Not on file  Intimate Partner Violence: Not At Risk (05/08/2021)   [facility] from [facility]   Humiliation, Afraid, Rape, and Kick questionnaire    Fear of Current or Ex-Partner: No    Emotionally Abused: No    Physically Abused: No    Sexually Abused: No   PHYSICAL EXAM  Via video visit, is alert and oriented, speech is clear and concise, facial symmetry noted.  Mobility is limited, reports low back pain  using a cane.  DIAGNOSTIC DATA (LABS, IMAGING, TESTING) - I reviewed patient records, labs, notes, testing and imaging myself where available.  Lab Results  Component Value Date   WBC 7.2 08/24/2021   HGB 14.5 08/24/2021   HCT 41.8 08/24/2021   MCV 94.8 08/24/2021   PLT 391 08/24/2021      Component Value Date/Time   NA 136 08/24/2021 1405   K 4.0 08/24/2021 1405   CL 105 08/24/2021 1405   CO2 25 08/24/2021 1405   GLUCOSE 86 08/24/2021 1405   BUN 12 08/24/2021 1405   CREATININE 0.72 08/24/2021 1405   CALCIUM 9.5 08/24/2021 1405   PROT 7.6 04/04/2015 1953   ALBUMIN 4.2 04/04/2015 1953   AST 18 04/04/2015 1953   ALT 16 04/04/2015 1953   ALKPHOS 61 04/04/2015 1953   BILITOT 0.4 04/04/2015 1953   GFRNONAA >60 08/24/2021 1405   GFRAA >60 12/10/2017 1819   No results found for: "CHOL", "HDL", "LDLCALC", "LDLDIRECT", "TRIG", "CHOLHDL" Lab Results  Component Value Date   HGBA1C 4.9 09/13/2016   No results found for: "VITAMINB12" Lab Results  Component Value Date  TSH 0.553 10/25/2014   Margie Ege, AGNP-C, DNP 02/05/2023, 1:47 PM [facility] [address], [address] [place], [place] [address] [phone]

## 2023-02-05 ENCOUNTER — Other Ambulatory Visit: Payer: Self-pay | Admitting: Allergy & Immunology

## 2023-02-05 ENCOUNTER — Other Ambulatory Visit: Payer: Self-pay | Admitting: Gastroenterology

## 2023-02-05 ENCOUNTER — Telehealth: Payer: 59 | Admitting: Neurology

## 2023-02-05 DIAGNOSIS — G43909 Migraine, unspecified, not intractable, without status migrainosus: Secondary | ICD-10-CM | POA: Insufficient documentation

## 2023-02-05 DIAGNOSIS — R569 Unspecified convulsions: Secondary | ICD-10-CM | POA: Diagnosis not present

## 2023-02-05 DIAGNOSIS — G43709 Chronic migraine without aura, not intractable, without status migrainosus: Secondary | ICD-10-CM

## 2023-02-05 DIAGNOSIS — G43009 Migraine without aura, not intractable, without status migrainosus: Secondary | ICD-10-CM

## 2023-02-05 MED ORDER — LEVETIRACETAM 500 MG PO TABS: ORAL_TABLET | ORAL | 3 refills | Status: DC

## 2023-02-05 NOTE — Patient Instructions (Signed)
Continue Keppra for seizure prevention.  If migraines increase and wish to restart Botox please reach out.

## 2023-02-11 ENCOUNTER — Encounter: Payer: Self-pay | Admitting: Gastroenterology

## 2023-02-11 ENCOUNTER — Ambulatory Visit (INDEPENDENT_AMBULATORY_CARE_PROVIDER_SITE_OTHER): Payer: 59 | Admitting: Gastroenterology

## 2023-02-11 VITALS — BP 118/70 | HR 89 | Temp 98.4°F | Ht 64.0 in | Wt 156.8 lb

## 2023-02-11 DIAGNOSIS — K200 Eosinophilic esophagitis: Secondary | ICD-10-CM | POA: Diagnosis not present

## 2023-02-11 DIAGNOSIS — K219 Gastro-esophageal reflux disease without esophagitis: Secondary | ICD-10-CM | POA: Diagnosis not present

## 2023-02-11 DIAGNOSIS — K5900 Constipation, unspecified: Secondary | ICD-10-CM | POA: Diagnosis not present

## 2023-02-11 MED ORDER — OMEPRAZOLE 40 MG PO CPDR: 40.0000 mg | DELAYED_RELEASE_CAPSULE | Freq: Every day | ORAL | 3 refills | Status: DC

## 2023-02-11 MED ORDER — LINACLOTIDE 290 MCG PO CAPS: 290.0000 ug | ORAL_CAPSULE | Freq: Every day | ORAL | 3 refills | Status: DC

## 2023-02-11 NOTE — Progress Notes (Signed)
GI Office Note    Referring Provider: Shelby Dubin, FNP Primary Care Physician:  Claire Dubin, FNP  Primary Gastroenterologist: Claire Sessions, MD   Chief Complaint   Chief Complaint  Patient presents with   Follow-up    Follow up on constipation and GERD    History of Present Illness   JOHNANNA [name] is a 52 y.o. female presenting today for follow-up.  Last seen October 2023.  History of GERD, EOE, and constipation. H/o h.pylori with confirmed eradication 12/2021 by stool antigen.  Today: a lot of medication changes since we last saw her. Got let go from pain management and she has been struggling with her chronic pain. Reflux well controlled on omeprazole 40mg  daily. Infrequent issues with swallowing, usually if swallows big bolus or large pill. Tries to be careful. No n/v. No abdominal pain. Ran out of Linzess recently but was working well before. No melena, brbpr.   Her colonoscopy 04/2021 was incomplete due to poor bowel prep. Patient reports that she had vomiting with one of the OTC bowel prep medications. She cannot remember name. Tolerated miralax.   EGD on file August 2018 with gray paper esophageal mucosa in longitudinal for is without tumor or Barrett's esophagus, antral erosions but no ulcers, 2 superficial tears in the midesophagus corresponding to an area of critical narrowing.  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  Stomach biopsies positive for H. pylori treated with Pylera and esophageal biopsy with squamous mucosa with marked increased epithelial eosinophils.  She was unable to complete H. pylori breath testing for eradication due to inability to stop her PPI at that time.    In October 2021 she had allergy testing with high IgE to cat dander, tree pollen, weed pollen, moderate IgE to grass pollen, low IgE to dog dander.  Very positive IgE levels to wheat, barley, rye, oat recommended to follow gluten free diet.  September 2022, alpha gal testing negative.  Stinging insect  panel positive for the entire panel with highest allergy to paper wasp.  Colonoscopy 04/2021: Dr. Arlyn [name] Impression:            - Preparation of the colon was poor.                       A digital rectal exam was performed                       - The examination was otherwise normal.                       - No specimens collected.                       - Repeat colonoscopy in 5 years for screening purposes.    Medications   Current Outpatient Medications  Medication Sig Dispense Refill   albuterol (PROVENTIL) (2.5 MG/3ML) 0.083% nebulizer solution Take 3 mLs (2.5 mg total) by nebulization every 4 (four) hours as needed for wheezing or shortness of breath. 150 mL 1   albuterol (VENTOLIN HFA) 108 (90 Base) MCG/ACT inhaler Inhale two puffs every 4-6 hours if needed for cough or wheeze. 18 g 1   ARNUITY ELLIPTA 200 MCG/ACT AEPB Take 1 puff by mouth at bedtime.     botulinum toxin Type A (BOTOX) 200 units injection Provider to inject 155 units into the muscles of the head and neck every  12 weeks. Discard remainder. 1 each 1   dupilumab (DUPIXENT) 300 MG/2ML prefilled syringe INJECT 300MG  SUBCUTANEOUSLY  EVERY OTHER WEEK 4 mL 11   EPINEPHRINE 0.3 mg/0.3 mL IJ SOAJ injection INJECT CONTENTS OF 1 PEN AS NEEDED FOR ALLERGIC REACTION 2 each 0   famotidine (PEPCID) 20 MG tablet Take 20 mg by mouth at bedtime.     ibuprofen (ADVIL) 800 MG tablet Take 800 mg by mouth 2 (two) times daily as needed.     lamoTRIgine (LAMICTAL) 100 MG tablet Take 100 mg by mouth 2 (two) times daily.     levETIRAcetam (KEPPRA) 500 MG tablet TAKE 1 TABLET BY MOUTH TWICE DAILY 180 tablet 3   levocetirizine (XYZAL) 5 MG tablet TAKE 1 TABLET BY MOUTH ONCE DAILY IN THE EVENING 30 tablet 5   LINZESS 290 MCG CAPS capsule TAKE 1 CAPSULE BY MOUTH ONCE DAILY 30  MINUTES  BEFORE  BREAKFAST 90 capsule 0   Milnacipran HCl (SAVELLA) 100 MG TABS tablet Take 1 tablet (100 mg total) by mouth 2 (two) times daily. 60 tablet 0    montelukast (SINGULAIR) 10 MG tablet TAKE 1 TABLET BY MOUTH AT BEDTIME 30 tablet 0   omeprazole (PRILOSEC) 40 MG capsule TAKE 1 CAPSULE BY MOUTH ONCE DAILY BEFORE BREAKFAST 30 capsule 5   potassium chloride 20 MEQ/15ML (10%) SOLN 20 mEq daily as needed.     topiramate (TOPAMAX) 25 MG tablet Take 25 mg by mouth daily.     TRELEGY ELLIPTA 200-62.5-25 MCG/ACT AEPB Inhale 1 puff into the lungs daily. 60 each 5   No current facility-administered medications for this visit.    Allergies   Allergies as of 02/11/2023 - Review Complete 02/11/2023  Allergen Reaction Noted   Baclofen Shortness Of Breath 10/10/2016   Duloxetine Hives 07/29/2012   Penicillins Hives 07/29/2012   Sulfa antibiotics Hives, Nausea And Vomiting, and Other (See Comments) 07/29/2012   Benadryl [diphenhydramine]  09/26/2021   Gluten meal  06/07/2019   Penicillin v potassium Other (See Comments) 05/01/2021   Duloxetine hcl Hives and Other (See Comments) 07/29/2012   Loratadine Other (See Comments) 07/28/2014   Ultram [tramadol] Other (See Comments) 09/12/2014     Past Medical History   Past Medical History:  Diagnosis Date   Acid reflux    Allergy    pollen, dust   Anxiety    Arthritis    Asthma    Carpal tunnel syndrome    bilateral   DDD (degenerative disc disease), cervical    Depression    Fibromyalgia    Hypertension    Hypokalemia    Migraine    Palpitations    Panic attacks    PTSD (post-traumatic stress disorder)    Seizures (HCC)    unknown etiology; last seizure was 2 years ago; on meds.   Syncope and collapse    Tennis elbow    Ulcer    Vertigo     Past Surgical History   Past Surgical History:  Procedure Laterality Date   BACK SURGERY  1993   BIOPSY  10/21/2016   Procedure: BIOPSY;  Surgeon: Claire Ade, MD;  Location: [facility];  Service: Endoscopy;;  gastric, esophagus   CARPAL TUNNEL RELEASE Left    2019   CHOLECYSTECTOMY     ESOPHAGOGASTRODUODENOSCOPY (EGD) WITH  PROPOFOL N/A 10/21/2016   Procedure: ESOPHAGOGASTRODUODENOSCOPY (EGD) WITH PROPOFOL;  Surgeon: Claire Ade, MD;  Location: [facility];  Service: Endoscopy;  Laterality: N/A;  9:30am  GALLBLADDER SURGERY     SHOULDER SURGERY Right    SPINE SURGERY  1993   lumbar disc surgery    Past Family History   Family History  Problem Relation Age of Onset   COPD Mother    Bronchitis Mother    Arthritis Mother    Depression Mother    Heart disease Mother    Hypertension Mother    Alcohol abuse Father    Early death Father        GSW   Migraines Sister    Colon cancer Maternal Grandmother    PKU Son    Sleep apnea Neg Hx     Past Social History   Social History   Socioeconomic History   Marital status: Single    Spouse name: Not on file   Number of children: 1   Years of education: HS   Highest education level: Not on file  Occupational History   Occupation: disability    Comment: unemployed  Tobacco Use   Smoking status: Never   Smokeless tobacco: Never  Vaping Use   Vaping status: Never Used  Substance and Sexual Activity   Alcohol use: Not Currently    Comment: drink occasionally   Drug use: No   Sexual activity: Not Currently    Partners: Female    Birth control/protection: None  Other Topics Concern   Not on file  Social History Narrative   Patient is left handed.   Patient drinks very little caffeine.   Lives alone   Social Determinants of Health   Financial Resource Strain: Not on file  Food Insecurity: Not on file  Transportation Needs: Not on file  Physical Activity: Not on file  Stress: Not on file  Social Connections: Not on file  Intimate Partner Violence: Not At Risk (05/08/2021)   Received from [facility]   Humiliation, Afraid, Rape, and Kick questionnaire    Fear of Current or Ex-Partner: No    Emotionally Abused: No    Physically Abused: No    Sexually Abused: No    Review of Systems   General: Negative for  anorexia, weight loss, fever, chills, fatigue, weakness. ENT: Negative for hoarseness, difficulty swallowing , nasal congestion. CV: Negative for chest pain, angina, palpitations, dyspnea on exertion, peripheral edema.  Respiratory: Negative for dyspnea at rest, dyspnea on exertion, cough, sputum, wheezing.  GI: See history of present illness. GU:  Negative for dysuria, hematuria, urinary incontinence, urinary frequency, nocturnal urination.  Endo: Negative for unusual weight change.     Physical Exam   BP 118/70   Pulse 89   Temp 98.4 F (36.9 C)   Ht 5\' 4"  (1.626 m)   Wt 156 lb 12.8 oz (71.1 kg)   BMI 26.91 kg/m    General: Well-nourished, well-developed in no acute distress.  Eyes: No icterus. Mouth: Oropharyngeal mucosa moist and pink   Lungs: Clear to auscultation bilaterally.  Heart: Regular rate and rhythm, no murmurs rubs or gallops.  Abdomen: Bowel sounds are normal, nontender, nondistended, no hepatosplenomegaly or masses,  no abdominal bruits or hernia , no rebound or guarding.  Rectal: not performed  Extremities: No lower extremity edema. No clubbing or deformities. Neuro: Alert and oriented x 4   Skin: Warm and dry, no jaundice.   Psych: Alert and cooperative, normal mood and affect.  Labs   No recent labs available  Imaging Studies   No results found.  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 No significant dysphagia. Has since had allergy testing with numerous allergens which she is trying to avoid.  -continue omeprazole and pepcid   Constipation:  -continue Linzess daily   Colonoscopy cancer screening: she recently had this done by Dr. Arlyn [name]. Prep was poor. Advised follow up colonoscopy in five years. Patient is interested in following up sooner given poor prep. To discuss with Dr. Jena Gauss. Per op note, time was taken to irrigate the walls but prep was poor and stool  throughout, it is not clear if mucosa was adequately seen.        Leanna Battles. Melvyn Neth, MHS, PA-C [facility]

## 2023-02-11 NOTE — Patient Instructions (Addendum)
Continue omeprazole and Linzess. New RX sent to your pharmacy.  We will reach out after first of the year and schedule you for a screening colonoscopy due to poor bowel prep in 2023.  Return office visit in one year.

## 2023-02-19 ENCOUNTER — Telehealth: Payer: Self-pay | Admitting: Gastroenterology

## 2023-02-19 ENCOUNTER — Other Ambulatory Visit: Payer: Self-pay | Admitting: Physical Medicine & Rehabilitation

## 2023-02-19 NOTE — Telephone Encounter (Signed)
I do not have anything. I checked the encounter form and it just says OV in 1 year.

## 2023-02-19 NOTE — Telephone Encounter (Signed)
Pt states she is not feeling well right now and would like to wait until January. Will call once get providers schedule.

## 2023-02-19 NOTE — Telephone Encounter (Signed)
Do you have this one on your radar to schedule for a colonoscopy?

## 2023-02-19 NOTE — Telephone Encounter (Signed)
Ok we had discussed possible colonoscopy next year due to poor bowel prep before 04/2021. If she is willing, dr Jena Gauss advises moving forward at any time with colonoscopy. It would be screening. ASA 2. Pregnancy test if indicated per protocol.   Recommend bisacodyl 10mg  daily for 3 days before colonoscopy.   Patient had issues vomiting bowel prep before so recommend short volume prep.preferably clenpiq.

## 2023-02-26 NOTE — Telephone Encounter (Signed)
LMTRC

## 2023-02-28 ENCOUNTER — Other Ambulatory Visit: Payer: Self-pay | Admitting: Allergy & Immunology

## 2023-03-05 ENCOUNTER — Encounter: Payer: Self-pay | Admitting: Allergy & Immunology

## 2023-03-05 ENCOUNTER — Ambulatory Visit: Payer: 59 | Admitting: Allergy & Immunology

## 2023-03-05 VITALS — BP 142/96 | HR 99 | Temp 98.4°F | Resp 16 | Ht 62.21 in | Wt 154.4 lb

## 2023-03-05 DIAGNOSIS — J3089 Other allergic rhinitis: Secondary | ICD-10-CM

## 2023-03-05 DIAGNOSIS — J4550 Severe persistent asthma, uncomplicated: Secondary | ICD-10-CM

## 2023-03-05 DIAGNOSIS — T7800XD Anaphylactic reaction due to unspecified food, subsequent encounter: Secondary | ICD-10-CM

## 2023-03-05 DIAGNOSIS — T7800XA Anaphylactic reaction due to unspecified food, initial encounter: Secondary | ICD-10-CM

## 2023-03-05 MED ORDER — LEVOCETIRIZINE DIHYDROCHLORIDE 5 MG PO TABS: 5.0000 mg | ORAL_TABLET | Freq: Every evening | ORAL | 5 refills | Status: DC

## 2023-03-05 MED ORDER — ALBUTEROL SULFATE (2.5 MG/3ML) 0.083% IN NEBU: 2.5000 mg | INHALATION_SOLUTION | RESPIRATORY_TRACT | 1 refills | Status: DC | PRN

## 2023-03-05 MED ORDER — MONTELUKAST SODIUM 10 MG PO TABS: 10.0000 mg | ORAL_TABLET | Freq: Every day | ORAL | 0 refills | Status: DC

## 2023-03-05 MED ORDER — PREDNISONE 10 MG PO TABS: ORAL_TABLET | ORAL | 0 refills | Status: DC

## 2023-03-05 MED ORDER — DOXYCYCLINE HYCLATE 100 MG PO TABS: 100.0000 mg | ORAL_TABLET | Freq: Two times a day (BID) | ORAL | 0 refills | Status: AC

## 2023-03-05 NOTE — Patient Instructions (Addendum)
Severe persistent asthma  - Lung testing looks excellent today. - We are going to work on getting the autoinjector approved instead. - Demonstration provided today.  - Daily controller medication(s): Trelegy 200/62.5/25 one puff once daily in the morning AND Dupixent 300mg  every two weeks - Prior to physical activity: albuterol 2 puffs 10-15 minutes before physical activity. - Rescue medications: albuterol 4 puffs every 4-6 hours as needed - Asthma control goals:  * Full participation in all desired activities (may need albuterol before activity) * Albuterol use two time or less a week on average (not counting use with activity) * Cough interfering with sleep two time or less a month * Oral steroids no more than once a year * No hospitalizations  2. Allergic rhinitis (cat dander, tree pollen, weed pollen, grass, and dog) - Continue Xyzal 5mg  to twice daily. - Continue Pataday one drop per eye daily. - Consider allergy shots for long term control, but this would require weekly visits to the office for a period of time.  - We do offer rush immunotherapy, which gets you through 3 vials in one day. - But this will still require weekly visits for 2-3 months to get to the top dose. - I do not think that this is a viable option at this time.   3. Acute sinusitis - Your symptoms have been going on long enough to warrant antibiotics. - Start doxycycline 100mg  twice daily for ten days. - Start prednisone taper sent in today.   4. 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 - Continue to avoid all of these items above.  5. 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 - EpiPen up to date  5. Chronic pain - Hopefully you can establish care with a new pain doctor.  - I would try going over the border to [place] for any alternative pain treatments.  - I believe that it is legalized there. - Handout on [place]  laws provided today. - It might even be helpful to find a pain doctor in [place].   6. Return in about 6 months (around 09/03/2023). You can have the follow up appointment with Dr. Dellis Anes or a Nurse Practicioner (our Nurse Practitioners are excellent and always have Physician oversight!).    Please inform us of any Emergency Department visits, hospitalizations, or changes in symptoms. Call us before going to the ED for breathing or allergy symptoms since we might be able to fit you in for a sick visit. Feel free to contact us anytime with any questions, problems, or concerns.  It was a pleasure to see you again today!  Websites that have reliable patient information: 1. American Academy of Asthma, Allergy, and Immunology: www.aaaai.org 2. Food Allergy Research and Education (FARE): foodallergy.org 3. Mothers of Asthmatics: http://www.asthmacommunitynetwork.org 4. American College of Allergy, Asthma, and Immunology: www.acaai.org      "Like" [place] on Facebook and Instagram for our latest updates!      A healthy democracy works best when [employer] participate! Make sure you are registered to vote! If you have moved or changed any of your contact information, you will need to get this updated before voting! Scan the QR codes below to learn more!

## 2023-03-05 NOTE — Progress Notes (Signed)
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 Everardo All)   Complicated past medical history including fibromyalgia    Plan/Recommendations:   Severe persistent asthma  - Lung testing looks excellent today. - We are going to work on getting the autoinjector approved instead. - Demonstration provided today.  - Daily controller medication(s): Trelegy 200/62.5/25 one puff once daily in the morning AND Dupixent 300mg  every two weeks - Prior to physical activity: albuterol 2 puffs 10-15 minutes before physical activity. - Rescue medications: albuterol 4 puffs every 4-6 hours as needed - Asthma control goals:  * Full participation in all desired activities (may need albuterol before activity) * Albuterol use two time or less a week on average (not counting use with activity) * Cough interfering with sleep two time or less a month * Oral steroids no more than once a year * No hospitalizations  2. Allergic rhinitis (cat dander, tree pollen, weed pollen, grass, and dog) - Continue Xyzal 5mg  to twice daily. - Continue Pataday one drop per eye daily. - Consider allergy shots for long term control, but this would require weekly visits to the office for a period of time.  - We do offer rush immunotherapy, which gets you through 3 vials in one day. - But this will still require weekly visits for 2-3 months to get to the top dose. - I do not think that this is a viable option at this time.   3. Acute sinusitis - Your symptoms have been going on long enough to warrant antibiotics. - Start doxycycline 100mg  twice daily for ten days. - Start  prednisone taper sent in today.   4. 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 - Continue to avoid all of these items above.  5. 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 - EpiPen up to date  5. Chronic pain - Hopefully you can establish care with a new pain doctor.  - I would try going over the border to [place] for any alternative pain treatments.  - I believe that it is legalized there. - Handout on [place] laws provided today. - It might even be helpful to find a pain doctor in [place].   6. Return in about 6 months (around 09/03/2023). You can have the follow up appointment with Dr. Dellis Anes or a Nurse Practicioner (our Nurse Practitioners are excellent and always have Physician oversight!).   Subjective:   Claire [name] is a 52 y.o. female presenting today for follow up of  Chief Complaint  Patient presents with   Follow-up    Congestion in chest, sinus drainage, cough    Claire [name] has a history of the following: Patient Active Problem List   Diagnosis Date Noted   Migraine    Lung nodule 04/12/2022   Severe persistent asthma without complication 04/12/2022   PTSD (post-traumatic stress disorder) 05/10/2019   Eosinophilic esophagitis 12/02/2016   H. 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  Discussed the use of AI scribe software for clinical note transcription with the patient and/or guardian, who gave verbal  consent to proceed.  Claire [name] is a 52 y.o. female presenting for a follow up visit.  She was last seen in March 2024.  At that time, she looked pretty stable.  Lung testing looked fantastic.  We continue with Trelegy 200 mcg 1 puff daily as well as Dupixent every 2 weeks.  For her rhinitis, she continued on Xyzal and Pataday.  For her food allergies, she continue to avoid wheat, rye, barley, and oat.  EpiPen was refilled.  For her stinging insects, she continued to avoid them.  Her rash had improved with triamcinolone, Xyzal, and Dupixent.  Since last visit, she has done very well for the most part, at least from a breathing perspective.    Asthma/Respiratory Symptom History: Claire [name] has a history of asthma, is currently on Trelegy and Dupixent. She reports good compliance with the Dupixent, despite initial discomfort with the needle. She expresses interest in switching to an autoinjector for ease of administration. She thinks that she will do better if she cannot see the needle. She also thinks that it might be easier to administer into the thigh. She is unable to get up here for [place] to administer the drug, although that would be her preference.   Allergic Rhinitis Symptom History: The patient also reports ongoing sinus issues, including significant drainage, sneezing, and ear wax production. The nasal discharge is described as yellow and has been ongoing for a couple of months. She reports some sinus pain, but it is not severe. She is not currently using any nasal sprays due to a strong aversion to them, but is taking an unspecified antihistamine once daily. She has not been on antibiotics in quite some time. Symptoms for this current illness have been ongoing for a period of two months.   In addition to her respiratory issues, the patient has been diagnosed with bipolar disorder and is currently using CBD gummies for symptom management. She expresses interest in obtaining a medical marijuana card for further  symptom relief. The patient also mentions a past negative experience with a pain clinic, which resulted in her no longer attending. She expresses frustration with the clinic's focus on money over patient care. She also shares that she thinks that they made her have a sale positive drug screen to boot her out of the clinic.   The patient is currently not on any pain management regimen and is exploring alternative options, including medical marijuana. She reports that the CBD gummies have been helpful in managing her bipolar symptoms, anxiety, and depression.   Otherwise, there have been no changes to her past medical history, surgical history, family history, or social history.    Review of systems otherwise negative other than that mentioned in the HPI.    Objective:   Blood pressure (!) 142/96, pulse 99, temperature 98.4 F (36.9 C), resp. rate 16, height 5' 2.21" (1.58 m), weight 154 lb 6 oz (70 kg), SpO2 97%. Body mass index is 28.05 kg/m.    Physical Exam Vitals reviewed.  Constitutional:  Appearance: She is well-developed.     Comments: Walking with a cane. Appears to be in pain.   HENT:     Head: Normocephalic and atraumatic.     Right Ear: Tympanic membrane, ear canal and external ear normal. No drainage, swelling or tenderness. Tympanic membrane is not injected, scarred, erythematous, retracted or bulging.     Left Ear: Tympanic membrane, ear canal and external ear normal. No drainage, swelling or tenderness. Tympanic membrane is not injected, scarred, erythematous, retracted or bulging.     Nose: No nasal deformity, septal deviation, mucosal edema or rhinorrhea.     Right Turbinates: Enlarged, swollen and pale.     Left Turbinates: Enlarged, swollen and pale.     Right Sinus: No maxillary sinus tenderness or frontal sinus tenderness.     Left Sinus: No maxillary sinus tenderness or frontal sinus tenderness.     Mouth/Throat:     Mouth: Mucous membranes are not pale  and not dry.     Pharynx: Uvula midline.  Eyes:     General:        Right eye: No discharge.        Left eye: No discharge.     Conjunctiva/sclera: Conjunctivae normal.     Right eye: Right conjunctiva is not injected. No chemosis.    Left eye: Left conjunctiva is not injected. No chemosis.    Pupils: Pupils are equal, round, and reactive to light.  Cardiovascular:     Rate and Rhythm: Normal rate and regular rhythm.     Heart sounds: Normal heart sounds.  Pulmonary:     Effort: Pulmonary effort is normal. No tachypnea, accessory muscle usage or respiratory distress.     Breath sounds: Normal breath sounds. No wheezing, rhonchi or rales.     Comments: Moving air well in all lung fields. No increased work of breathing noted.  Chest:     Chest wall: No tenderness.  Abdominal:     Tenderness: There is no abdominal tenderness. There is no guarding or rebound.  Lymphadenopathy:     Head:     Right side of head: No submandibular, tonsillar or occipital adenopathy.     Left side of head: No submandibular, tonsillar or occipital adenopathy.     Cervical: No cervical adenopathy.  Skin:    Coloration: Skin is not pale.     Findings: No abrasion, erythema, petechiae or rash. Rash is not papular, urticarial or vesicular.  Neurological:     Mental Status: She is alert.  Psychiatric:        Behavior: Behavior is cooperative.      Diagnostic studies:    Spirometry: results normal (FEV1: 2.78/113%, FVC: 3.63/117%, FEV1/FVC: 77%).    Spirometry consistent with normal pattern.   Allergy Studies: none        Malachi Bonds, MD  [facility]

## 2023-03-07 ENCOUNTER — Telehealth: Payer: Self-pay | Admitting: *Deleted

## 2023-03-07 MED ORDER — DUPIXENT 300 MG/2ML ~~LOC~~ SOAJ: 300.0000 mg | SUBCUTANEOUS | 11 refills | Status: DC

## 2023-03-07 NOTE — Telephone Encounter (Signed)
-----   Message from Alfonse Spruce sent at 03/05/2023  3:08 PM EST ----- Patient wants the autoinjector for the Dupixent. Is that  an option?

## 2023-03-07 NOTE — Telephone Encounter (Signed)
Spoke to patient and advised will get approval for pen and send new Rx should get same with next delivery from [facility]

## 2023-03-07 NOTE — Telephone Encounter (Signed)
Thanks much, Claire [name]!

## 2023-03-26 ENCOUNTER — Telehealth: Payer: Self-pay | Admitting: Neurology

## 2023-03-26 NOTE — Telephone Encounter (Signed)
 LVM and sent mychart msg informing pt of need to reschedule 02/10/24 appt - NP out

## 2023-03-27 ENCOUNTER — Encounter: Payer: 59 | Admitting: Physical Medicine & Rehabilitation

## 2023-03-31 ENCOUNTER — Other Ambulatory Visit: Payer: Self-pay | Admitting: Allergy & Immunology

## 2023-04-14 ENCOUNTER — Ambulatory Visit (HOSPITAL_BASED_OUTPATIENT_CLINIC_OR_DEPARTMENT_OTHER): Payer: 59 | Admitting: Pulmonary Disease

## 2023-04-15 ENCOUNTER — Encounter (HOSPITAL_BASED_OUTPATIENT_CLINIC_OR_DEPARTMENT_OTHER): Payer: Self-pay | Admitting: Pulmonary Disease

## 2023-04-15 ENCOUNTER — Telehealth (HOSPITAL_BASED_OUTPATIENT_CLINIC_OR_DEPARTMENT_OTHER): Payer: 59 | Admitting: Pulmonary Disease

## 2023-04-15 ENCOUNTER — Encounter: Payer: Self-pay | Admitting: *Deleted

## 2023-04-15 ENCOUNTER — Encounter (HOSPITAL_BASED_OUTPATIENT_CLINIC_OR_DEPARTMENT_OTHER): Payer: Self-pay

## 2023-04-15 ENCOUNTER — Other Ambulatory Visit (HOSPITAL_COMMUNITY): Payer: Self-pay

## 2023-04-15 DIAGNOSIS — R911 Solitary pulmonary nodule: Secondary | ICD-10-CM | POA: Diagnosis not present

## 2023-04-15 DIAGNOSIS — J4550 Severe persistent asthma, uncomplicated: Secondary | ICD-10-CM

## 2023-04-15 MED ORDER — INCRUSE ELLIPTA 62.5 MCG/ACT IN AEPB: 1.0000 | INHALATION_SPRAY | Freq: Every day | RESPIRATORY_TRACT | 5 refills | Status: DC

## 2023-04-15 MED ORDER — SYMBICORT 160-4.5 MCG/ACT IN AERO: 2.0000 | INHALATION_SPRAY | Freq: Two times a day (BID) | RESPIRATORY_TRACT | 5 refills | Status: DC

## 2023-04-15 NOTE — Progress Notes (Signed)
Virtual Visit via Video Note  I connected with Claire [name] on 04/15/23 at 11:00 AM EST by a video enabled telemedicine application and verified that I am speaking with the correct person using two identifiers.  Location: Patient: Home Provider: [facility]   I discussed the limitations of evaluation and management by telemedicine and the availability of in person appointments. The patient expressed understanding and agreed to proceed.   I discussed the assessment and treatment plan with the patient. The patient was provided an opportunity to ask questions and all were answered. The patient agreed with the plan and demonstrated an understanding of the instructions.   The patient was advised to call back or seek an in-person evaluation if the symptoms worsen or if the condition fails to improve as anticipated.   Subjective:   PATIENT ID: Claire [name] GENDER: female DOB: 06-12-1970, MRN: [id number]  Chief Complaint  Patient presents with   Follow-up    Lung nodule     Reason for Visit: Follow-up  [name] is a 53 year old female with COPD/asthma, seasonal allergies, fibromyalgia, GAD, depression, reflex, migraines, RLS, DDD, HTN, seizures who for follow-up for asthma and lung nodule.  Initial consult After working 10 years in a [employer], she reports diagnosis of asthma in her 33s. Currently followed by Allergy and Asthma and is on Trelegy and Dupixent. Using Albuterol 2-3 times a day for shortness of breath. Has used nebulizer 1-3 times a week. Denies wheezing or frequent coughing. Triggered by strong odors or allergens. Allergy note reviewed and planning to change to a different biologic.   Denies personal history of cancer or family history of lung cancer. Previously worked at a [employer], Systems analyst (wore masks) x 3, [employer]. 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 Currently not on allergy shots due to disability and affordability. Has not needed rescue inhaler recently. She does wake up coughing every other night and needs the fan running. Denies wheezing. Symptoms are improved since being on Dupixent.  09/27/22 Since our last visit, asthma symptoms have been well controlled on Trelegy and Dupixent, which are being managed by Allergy. She presents today for PET/CT follow-up. Denies shortness of breath, cough or wheezing.   04/15/23 Since our last visit, she presents for routine follow-up for lung nodule. Her asthma is managed by Allergy and currently on Trelegy and Dupixent. She reports she is having ongoing chest tightness, cough and shortness of breath. She was last treated for sinus infection on 03/05/23 with prednisone and doxycycline. Has not had any treatment then.  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   Syncope and collapse    Tennis elbow    Ulcer    Vertigo      Family History  Problem Relation Age of Onset   COPD Mother    Bronchitis Mother    Arthritis Mother    Depression Mother    Heart disease Mother    Hypertension Mother    Alcohol abuse Father    Early death Father        GSW   Migraines Sister    Colon cancer Maternal Grandmother    PKU Son    Sleep apnea Neg Hx       Social History   Occupational History   Occupation: disability    Comment: unemployed  Tobacco Use   Smoking status: Never   Smokeless tobacco: Never  Vaping Use   Vaping status: Never Used  Substance and Sexual Activity   Alcohol use: Not Currently    Comment: drink occasionally   Drug use: No   Sexual activity: Not Currently    Partners: Female    Birth control/protection: None    Allergies  Allergen Reactions   Baclofen Shortness Of Breath    Swollen ankles   Duloxetine Hives    Other reaction(s): Other (See Comments) "Hives, forgot who I was"   Penicillins Hives    Has patient had a PCN reaction causing immediate rash, facial/tongue/throat swelling, SOB or lightheadedness with hypotension: Unknown Has patient had a PCN reaction causing severe rash involving mucus membranes or skin necrosis: Yes Has patient had a PCN reaction that required hospitalization: Unknown Has patient had a PCN reaction occurring within the last 10 years: Unknown If all of the above answers are "NO", then may proceed with Cephalosporin use. Other reaction(s): Other (See Comments) Has patient had a PCN reaction causing immediate rash, facial/tongue/throat swelling, SOB or lightheadedness with hypotension: Unknown Has patient had a PCN reaction causing severe rash involving mucus membranes or skin necrosis: Yes Has patient had a PCN reaction that required hospitalization: Unknown Has patient had a PCN reaction occurring within the last 10 years: Unknown If all of the above answers are "NO", then may proceed with Cephalosporin use.   Sulfa Antibiotics Hives, Nausea And Vomiting and Other (See Comments)    Other Reaction(s): Unknown   Benadryl [Diphenhydramine]     Started throwing up   Gluten Meal    Penicillin V Potassium Other (See Comments)    Other Reaction(s): Unknown   Duloxetine Hcl Hives and Other (See Comments)    "Hives, forgot who I was"  Other Reaction(s): Unknown    Loratadine Other (See Comments)    shaking  Other Reaction(s): Unknown   Ultram [Tramadol] Other (See Comments)    Dizzy     Outpatient Medications Prior to Visit  Medication Sig Dispense Refill   albuterol (PROVENTIL) (2.5 MG/3ML) 0.083% nebulizer solution Take 3 mLs (2.5 mg total) by nebulization every 4 (four) hours as needed for wheezing or shortness of breath. 150 mL 1   albuterol (VENTOLIN HFA) 108 (90 Base) MCG/ACT inhaler Inhale two puffs every 4-6 hours if needed for cough or wheeze. 18 g 1   ARNUITY ELLIPTA 200 MCG/ACT AEPB Take 1 puff by mouth at bedtime.     botulinum toxin Type A (BOTOX) 200 units injection Provider to inject 155 units into the muscles of the head and neck every 12 weeks. Discard remainder. 1 each 1  clindamycin (CLEOCIN) 300 MG capsule Take by mouth.     Dupilumab (DUPIXENT) 300 MG/2ML SOAJ Inject 300 mg into the skin every 14 (fourteen) days. 4 mL 11   EPINEPHRINE 0.3 mg/0.3 mL IJ SOAJ injection INJECT CONTENTS OF 1 PEN AS NEEDED FOR ALLERGIC REACTION 2 each 0   famotidine (PEPCID) 20 MG tablet Take 20 mg by mouth at bedtime. (Patient not taking: Reported on 03/05/2023)     ibuprofen (ADVIL) 800 MG tablet Take 800 mg by mouth 2 (two) times daily as needed.     lamoTRIgine (LAMICTAL) 100 MG tablet Take 100 mg by mouth 2 (two) times daily.     levETIRAcetam (KEPPRA) 500 MG tablet TAKE 1 TABLET BY MOUTH TWICE DAILY 180 tablet 3   levocetirizine (XYZAL) 5 MG tablet Take 1 tablet (5 mg total) by mouth every evening. 30 tablet 5   linaclotide (LINZESS) 290 MCG CAPS capsule Take 1 capsule (290 mcg total) by mouth daily before breakfast. 90 capsule 3   Milnacipran HCl (SAVELLA) 100 MG TABS tablet Take 1 tablet by mouth twice daily 60 tablet 4   montelukast (SINGULAIR) 10 MG tablet TAKE 1 TABLET BY MOUTH AT BEDTIME 30 tablet 5   omeprazole (PRILOSEC) 40 MG capsule Take 1 capsule (40 mg total) by mouth daily before breakfast. 90 capsule 3   ondansetron (ZOFRAN-ODT) 4  MG disintegrating tablet Take by mouth.     potassium chloride 20 MEQ/15ML (10%) SOLN 20 mEq daily as needed.     predniSONE (DELTASONE) 10 MG tablet Take two tablets (20mg ) twice daily for three days, then one tablet (10mg ) twice daily for three days, then STOP. 18 tablet 0   topiramate (TOPAMAX) 25 MG tablet Take 25 mg by mouth daily.     venlafaxine XR (EFFEXOR-XR) 37.5 MG 24 hr capsule Take 37.5 mg by mouth daily.     TRELEGY ELLIPTA 200-62.5-25 MCG/ACT AEPB Inhale 1 puff into the lungs daily. 60 each 5   No facility-administered medications prior to visit.    Review of Systems  Constitutional:  Negative for chills, diaphoresis, fever, malaise/fatigue and weight loss.  HENT:  Negative for congestion.   Respiratory:  Positive for cough and shortness of breath. Negative for hemoptysis, sputum production and wheezing.   Cardiovascular:  Negative for chest pain (chest tightness), palpitations and leg swelling.     Objective:   There were no vitals filed for this visit.    Physical Exam: General: Well-appearing, no acute distress HENT: [place], AT Eyes: EOMI, no scleral icterus Respiratory: No respiratory distress Neuro: AAO x4, CNII-XII grossly intact Psych: Normal mood, normal affect  Data Reviewed:  Imaging: CT Chest 11/28/21 - 7 mm ill-defined pleural based nodular density along lateral left lung base CXR 03/13/22 - No infiltrate effusion or edema CT Chest 06/06/21 - 1.1 mm pleural based lung nodule in the left lateral base PET/CT 09/25/22 - LLL less defined with no significant metabolic activity. RUL opacity with metabolic activity likely inflammatory  PFT: Spirometry 12/28/21 FVC 3.10 (92%) FEV1 2.57 (96%) Ratio 83   Interpretation: Normal spirometry  Spirometry 03/13/22 FVC 3.24 (104%) FEV1 2.54 (102%) Ratio 78   Interpretation: Normal spirometry  Spirometry 03/05/23 FVC 3.63 (117%) FEV1 2.78 (112%) Ratio 77  Interpretation: Normal spirometry   Labs: CBC     Component Value Date/Time   WBC 7.2 08/24/2021 1405   RBC 4.41 08/24/2021 1405   HGB 14.5 08/24/2021 1405   HGB 14.0 05/13/2019 1531   HCT 41.8 08/24/2021 1405  HCT 38.9 05/13/2019 1531   PLT 391 08/24/2021 1405   PLT 427 05/13/2019 1531   MCV 94.8 08/24/2021 1405   MCV 92 05/13/2019 1531   MCH 32.9 08/24/2021 1405   MCHC 34.7 08/24/2021 1405   RDW 11.9 08/24/2021 1405   RDW 12.1 05/13/2019 1531   LYMPHSABS 2.6 05/13/2019 1531   MONOABS 0.5 08/09/2016 0956   EOSABS 0.2 05/13/2019 1531   BASOSABS 0.1 05/13/2019 1531   Absolute eos 05/13/19 - 200     Assessment & Plan:   Discussion: 53 year old female with COPD/asthma, seasonal allergies, fibromyalgia, GAD, depression, reflex, migraines, RLS, DDD, HTN, seizures who for follow-up pulmonary nodule.   Reviewed PET/CT and addressed questions and concerns. Less likely representative of malignancy at this point and recommended against invasive testing. Patient still expressing concern about complete resolution. Planning to repeat CT chest in March 2025 to ensure stability.  Regarding asthma, patient requesting change in regimen. Made adjustments below and will message her Allergy doctor in regards to the trial off Trelegy to see if this improves symptoms. Not in exacerbation but reports mild chest tightness and shortness of breath despite compliance to meds.  LLL lung nodule  Previously reviewed PET/CT from 09/2022. Less discernible, no metabolic activity. Not likely malignant --ORDER CT Chest without contrast  end of 05/2023  Severe persistent asthma Managed by Allergy. Patient requesting to try different regimen --STOP Trelegy --START brand name Symbicort 160-4.5 mcg TWO puffs in the morning and evening. Rinse mouth out after use --START Incruse ONE puff ONCE a day --CONTINUE Albuterol as needed --CONTINUE nebulizer as needed --CONTINUE Dupixent   Health Maintenance Immunization History  Administered Date(s) Administered    Influenza Split 05/11/2014   Influenza, Quadrivalent, Recombinant, Inj, Pf 12/17/2018   Influenza, Seasonal, Injecte, Preservative Fre 12/22/2019   Influenza,inj,Quad PF,6+ Mos 11/12/2016   Influenza-Unspecified 11/12/2016, 04/13/2018, 12/26/2018   Tdap 11/12/2016   CT Lung Screen - never smoker. Not qualified  Orders Placed This Encounter  Procedures   CT Chest Wo Contrast    Standing Status:   Future    Expiration Date:   04/14/2024    Scheduling Instructions:     Schedule for end of March 2025    Is patient pregnant?:   No    Preferred imaging location?:   [facility]   Meds ordered this encounter  Medications   SYMBICORT 160-4.5 MCG/ACT inhaler    Sig: Inhale 2 puffs into the lungs 2 (two) times daily.    Dispense:  1 each    Refill:  5    Brand name   umeclidinium bromide (INCRUSE ELLIPTA) 62.5 MCG/ACT AEPB    Sig: Inhale 1 puff into the lungs daily.    Dispense:  30 each    Refill:  5    Return for after CT scan in early April .  I have spent a total time of 35-minutes on the day of the appointment including chart review, data review, collecting history, coordinating care and discussing medical diagnosis and plan with the patient/family. Past medical history, allergies, medications were reviewed. Pertinent imaging, labs and tests included in this note have been reviewed and interpreted independently by me.  Ileen Kahre Mechele Collin, MD [facility] 04/15/2023 11:20 AM

## 2023-04-15 NOTE — Telephone Encounter (Signed)
Mailed letter

## 2023-04-15 NOTE — Patient Instructions (Signed)
LLL lung nodule  Previously reviewed PET/CT from 09/2022. Less discernible, no metabolic activity. Not likely malignant --ORDER CT Chest without contrast  end of 05/2023  Severe persistent asthma Managed by Allergy. Patient requesting to try different regimen --STOP Trelegy --START brand name Symbicort 160-4.5 mcg TWO puffs in the morning and evening. Rinse mouth out after use --START Incruse ONE puff ONCE a day --CONTINUE Albuterol as needed --CONTINUE nebulizer as needed --CONTINUE Dupixent

## 2023-04-25 ENCOUNTER — Ambulatory Visit
Admission: RE | Admit: 2023-04-25 | Discharge: 2023-04-25 | Disposition: A | Discharge status: Home / Self Care | Payer: 59 | Source: Ambulatory Visit | Attending: Pulmonary Disease

## 2023-04-25 DIAGNOSIS — R911 Solitary pulmonary nodule: Secondary | ICD-10-CM

## 2023-05-09 ENCOUNTER — Other Ambulatory Visit: Payer: Self-pay | Admitting: Allergy & Immunology

## 2023-05-21 ENCOUNTER — Telehealth: Payer: Self-pay

## 2023-05-21 ENCOUNTER — Telehealth: Payer: Self-pay | Admitting: Allergy & Immunology

## 2023-05-21 MED ORDER — TRIAMCINOLONE ACETONIDE 0.025 % EX OINT: 1.0000 | TOPICAL_OINTMENT | Freq: Two times a day (BID) | CUTANEOUS | 0 refills | Status: DC | PRN

## 2023-05-21 MED ORDER — NEBULIZER MISC: 1.0000 | 1 refills | Status: DC | PRN
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
## 2023-05-21 NOTE — Telephone Encounter (Signed)
 Called and spoke to patient and informed her that refills would be sent to pharmacies. Patient verbalized understanding.
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
## 2023-05-26 ENCOUNTER — Other Ambulatory Visit: Payer: Self-pay

## 2023-05-28 ENCOUNTER — Encounter (HOSPITAL_BASED_OUTPATIENT_CLINIC_OR_DEPARTMENT_OTHER): Payer: Self-pay | Admitting: Pulmonary Disease

## 2023-05-29 ENCOUNTER — Ambulatory Visit: Admitting: Podiatry

## 2023-05-29 DIAGNOSIS — M722 Plantar fascial fibromatosis: Secondary | ICD-10-CM

## 2023-06-05 ENCOUNTER — Encounter: Payer: Self-pay | Admitting: Podiatry

## 2023-06-05 ENCOUNTER — Other Ambulatory Visit: Payer: Self-pay | Admitting: Podiatry

## 2023-06-05 ENCOUNTER — Ambulatory Visit (INDEPENDENT_AMBULATORY_CARE_PROVIDER_SITE_OTHER)

## 2023-06-05 ENCOUNTER — Ambulatory Visit (INDEPENDENT_AMBULATORY_CARE_PROVIDER_SITE_OTHER): Admitting: Podiatry

## 2023-06-05 DIAGNOSIS — M722 Plantar fascial fibromatosis: Secondary | ICD-10-CM | POA: Diagnosis not present

## 2023-06-05 MED ORDER — METHYLPREDNISOLONE 4 MG PO TBPK: ORAL_TABLET | ORAL | 0 refills | Status: DC

## 2023-06-05 MED ORDER — TRIAMCINOLONE ACETONIDE 40 MG/ML IJ SUSP
20.0000 mg | Freq: Once | INTRAMUSCULAR | Status: AC
  Administered 2023-06-05: 20 mg

## 2023-06-05 MED ORDER — MELOXICAM 15 MG PO TABS: 15.0000 mg | ORAL_TABLET | Freq: Every day | ORAL | 3 refills | Status: DC
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
## 2023-06-05 NOTE — Patient Instructions (Signed)

## 2023-06-25 ENCOUNTER — Ambulatory Visit (HOSPITAL_BASED_OUTPATIENT_CLINIC_OR_DEPARTMENT_OTHER): Payer: 59 | Admitting: Pulmonary Disease

## 2023-06-25 ENCOUNTER — Encounter (HOSPITAL_BASED_OUTPATIENT_CLINIC_OR_DEPARTMENT_OTHER): Payer: Self-pay | Admitting: Pulmonary Disease

## 2023-06-25 VITALS — BP 122/80 | HR 100 | Ht 62.1 in | Wt 152.7 lb

## 2023-06-25 DIAGNOSIS — J4550 Severe persistent asthma, uncomplicated: Secondary | ICD-10-CM

## 2023-06-25 DIAGNOSIS — X001XXA Exposure to smoke in uncontrolled fire in building or structure, initial encounter: Secondary | ICD-10-CM | POA: Diagnosis not present

## 2023-06-25 DIAGNOSIS — J45909 Unspecified asthma, uncomplicated: Secondary | ICD-10-CM

## 2023-06-25 DIAGNOSIS — J449 Chronic obstructive pulmonary disease, unspecified: Secondary | ICD-10-CM

## 2023-06-25 DIAGNOSIS — R911 Solitary pulmonary nodule: Secondary | ICD-10-CM

## 2023-06-25 DIAGNOSIS — Z7722 Contact with and (suspected) exposure to environmental tobacco smoke (acute) (chronic): Secondary | ICD-10-CM

## 2023-06-25 NOTE — Patient Instructions (Signed)
 LLL lung nodule  Previously reviewed PET/CT from 09/2022. Less discernible, no metabolic activity. Not likely malignant --Reviewed CT Chest 04/25/23. Stable LLL nodule compared to 05/2022 --Will order at next visit. CT Chest without contrast  end of 05/2024  Severe persistent asthma --CONTINUE brand name Symbicort 160-4.5 mcg TWO puffs in the morning and evening. Rinse mouth out after use --CONTINUE Incruse ONE puff ONCE a day --CONTINUE Albuterol as needed --CONTINUE nebulizer as needed --CONTINUE Dupixent
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
## 2023-06-29 ENCOUNTER — Other Ambulatory Visit: Payer: Self-pay | Admitting: Allergy & Immunology

## 2023-07-08 ENCOUNTER — Ambulatory Visit (INDEPENDENT_AMBULATORY_CARE_PROVIDER_SITE_OTHER): Admitting: Podiatry

## 2023-07-08 ENCOUNTER — Encounter: Payer: Self-pay | Admitting: Podiatry

## 2023-07-08 DIAGNOSIS — M722 Plantar fascial fibromatosis: Secondary | ICD-10-CM | POA: Diagnosis not present

## 2023-07-08 MED ORDER — MELOXICAM 15 MG PO TABS: 15.0000 mg | ORAL_TABLET | Freq: Every day | ORAL | 3 refills | Status: DC

## 2023-07-08 MED ORDER — TRIAMCINOLONE ACETONIDE 40 MG/ML IJ SUSP
20.0000 mg | Freq: Once | INTRAMUSCULAR | Status: AC
  Administered 2023-07-08: 20 mg

## 2023-07-09 ENCOUNTER — Encounter (HOSPITAL_BASED_OUTPATIENT_CLINIC_OR_DEPARTMENT_OTHER): Payer: Self-pay | Admitting: Pulmonary Disease

## 2023-07-09 NOTE — Progress Notes (Signed)
 She presents today for follow-up of her plantar fasciitis left foot.  States that the shot really helped but is just temporary she says now is back hurting again.  Objective: Vital signs are stable alert and oriented x 3.  Pulses are palpable.  She has pain on palpation medial calcaneal tubercle of the left heel but it is not nearly as painful as it was previously.  Assessment: Plantar fasciitis left.  Plan: Reinjected the left heel again today 20 mg Kenalog  tomograms Marcaine restarted her meloxicam .  Follow-up with her in 1 month if not improved consider MRI

## 2023-08-07 ENCOUNTER — Ambulatory Visit (INDEPENDENT_AMBULATORY_CARE_PROVIDER_SITE_OTHER): Admitting: Podiatry

## 2023-08-07 ENCOUNTER — Ambulatory Visit (INDEPENDENT_AMBULATORY_CARE_PROVIDER_SITE_OTHER)

## 2023-08-07 DIAGNOSIS — S93691A Other sprain of right foot, initial encounter: Secondary | ICD-10-CM

## 2023-08-07 DIAGNOSIS — M7751 Other enthesopathy of right foot: Secondary | ICD-10-CM

## 2023-08-07 DIAGNOSIS — S90111A Contusion of right great toe without damage to nail, initial encounter: Secondary | ICD-10-CM

## 2023-08-11 NOTE — Progress Notes (Signed)
 She presents today for follow-up of her plantar fasciitis to her left foot states that she still gets pain.  She has a new concern of the right foot where she hit her great toe on the door frame about a week ago while playing with her dog she says has been hurting ever since.  Objective: Vital signs are stable alert and oriented x 3.  Left foot does demonstrate severe pain on palpation of the left heel.  The plantar fascia is no longer palpable along the medial arch.  More than likely indicating a probable plantar fascial tear.  She does have tenderness on medial and lateral compression of the calcaneus as well questioning a possible stress fracture that we were not able to see on plain film radiographs.  Right foot hallux does demonstrate some ecchymosis no edema no erythema cellulitis drainage or odor she does have some mild tenderness on palpation as well as range of motion around the proximal phalanx and the metatarsal phalangeal joint.  Radiographs taken today of the right foot do not demonstrate any type of significant osseous abnormalities in the area of question.  She does have a bipartite tibial sesamoid good bone mineralization otherwise.  Assessment: Contusion right foot first metatarsal phalangeal joint area and hallux.  Probable tear of the plantar fascia chronic longstanding plantar fasciitis left.  Plan: Discussed etiology pathology conservative versus surgical therapies.  At this point due to the tenderness of the first metatarsophalangeal joint and hallux.  We dispensed a Darco shoe.  We are also going to request MRI of the rear foot to better visualize the fascia all conservative therapies have failed to render her asymptomatic so at this point an MRI will be used for surgical planning and/or differential diagnoses.

## 2023-08-13 ENCOUNTER — Other Ambulatory Visit: Payer: Self-pay

## 2023-08-13 ENCOUNTER — Telehealth: Payer: Self-pay

## 2023-08-13 DIAGNOSIS — M722 Plantar fascial fibromatosis: Secondary | ICD-10-CM

## 2023-08-13 DIAGNOSIS — S96912D Strain of unspecified muscle and tendon at ankle and foot level, left foot, subsequent encounter: Secondary | ICD-10-CM

## 2023-08-13 DIAGNOSIS — M25572 Pain in left ankle and joints of left foot: Secondary | ICD-10-CM

## 2023-08-13 NOTE — Telephone Encounter (Signed)
 Patient called and left a message. They called to schedule her MRI - they wanted to schedule right foot, but patient states it is supposed to be the left. Please clarify

## 2023-08-24 ENCOUNTER — Other Ambulatory Visit: Payer: Self-pay | Admitting: Physical Medicine & Rehabilitation

## 2023-08-25 ENCOUNTER — Ambulatory Visit
Admission: RE | Admit: 2023-08-25 | Discharge: 2023-08-25 | Disposition: A | Discharge status: Home / Self Care | Source: Ambulatory Visit | Attending: Podiatry | Admitting: Podiatry

## 2023-08-25 DIAGNOSIS — S96912D Strain of unspecified muscle and tendon at ankle and foot level, left foot, subsequent encounter: Secondary | ICD-10-CM

## 2023-08-25 DIAGNOSIS — M722 Plantar fascial fibromatosis: Secondary | ICD-10-CM

## 2023-08-25 DIAGNOSIS — M25572 Pain in left ankle and joints of left foot: Secondary | ICD-10-CM

## 2023-08-25 DIAGNOSIS — S93691A Other sprain of right foot, initial encounter: Secondary | ICD-10-CM

## 2023-08-27 ENCOUNTER — Ambulatory Visit (INDEPENDENT_AMBULATORY_CARE_PROVIDER_SITE_OTHER): Admitting: Gastroenterology

## 2023-08-27 ENCOUNTER — Encounter: Payer: Self-pay | Admitting: Gastroenterology

## 2023-08-27 VITALS — BP 143/102 | HR 80 | Temp 97.7°F | Ht 64.0 in | Wt 143.8 lb

## 2023-08-27 DIAGNOSIS — R112 Nausea with vomiting, unspecified: Secondary | ICD-10-CM | POA: Diagnosis not present

## 2023-08-27 DIAGNOSIS — Z8619 Personal history of other infectious and parasitic diseases: Secondary | ICD-10-CM

## 2023-08-27 DIAGNOSIS — K200 Eosinophilic esophagitis: Secondary | ICD-10-CM

## 2023-08-27 DIAGNOSIS — R1319 Other dysphagia: Secondary | ICD-10-CM

## 2023-08-27 DIAGNOSIS — G8929 Other chronic pain: Secondary | ICD-10-CM | POA: Insufficient documentation

## 2023-08-27 DIAGNOSIS — R1310 Dysphagia, unspecified: Secondary | ICD-10-CM | POA: Diagnosis not present

## 2023-08-27 DIAGNOSIS — R1031 Right lower quadrant pain: Secondary | ICD-10-CM

## 2023-08-27 DIAGNOSIS — K219 Gastro-esophageal reflux disease without esophagitis: Secondary | ICD-10-CM | POA: Diagnosis not present

## 2023-08-27 DIAGNOSIS — R630 Anorexia: Secondary | ICD-10-CM

## 2023-08-27 DIAGNOSIS — R1013 Epigastric pain: Secondary | ICD-10-CM | POA: Insufficient documentation

## 2023-08-27 DIAGNOSIS — R634 Abnormal weight loss: Secondary | ICD-10-CM
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
## 2023-08-27 NOTE — Patient Instructions (Addendum)
 Please call first thing in the morning with complete medication list.   We will schedule you for upper endoscopy once we have your medication list.   It would be helpful to update some labs to evaluate your symptoms. Please have done at [facility] within next 48 hours.

## 2023-08-28 ENCOUNTER — Encounter: Payer: Self-pay | Admitting: *Deleted

## 2023-08-28 ENCOUNTER — Other Ambulatory Visit: Payer: Self-pay | Admitting: *Deleted

## 2023-08-28 ENCOUNTER — Telehealth: Payer: Self-pay | Admitting: *Deleted

## 2023-08-28 DIAGNOSIS — K219 Gastro-esophageal reflux disease without esophagitis: Secondary | ICD-10-CM

## 2023-08-28 MED ORDER — OMEPRAZOLE 40 MG PO CPDR: 40.0000 mg | DELAYED_RELEASE_CAPSULE | Freq: Two times a day (BID) | ORAL | 0 refills | Status: DC

## 2023-08-28 NOTE — Addendum Note (Signed)
 Addended by: Lanney Pitts on: 08/28/2023 10:55 AM   Modules accepted: Orders

## 2023-08-28 NOTE — Telephone Encounter (Signed)
 Pt informed of providers recommendations. Verbalized understanding. Pt scheduled for 09/18/23, instructions sent via mychart.

## 2023-08-28 NOTE — Telephone Encounter (Signed)
 Please schedule EGD/ED with RMR. ASA 3, room 1,2 ok.  Dx: N/V, dysphagia, epig pain, weight loss  Please let her know that I am wanting her to increase omeprazole  to 40mg  before breakfast and supper for next couple of months. I have sent in new rx.  She may or may not need famotine (pepcid) at bedtime.

## 2023-08-28 NOTE — Telephone Encounter (Signed)
 Pt called with medications list. Medications are all correct from office visit yesterday.

## 2023-09-03 ENCOUNTER — Encounter: Payer: Self-pay | Admitting: Allergy & Immunology

## 2023-09-03 ENCOUNTER — Ambulatory Visit (INDEPENDENT_AMBULATORY_CARE_PROVIDER_SITE_OTHER): Payer: 59 | Admitting: Allergy & Immunology

## 2023-09-03 VITALS — BP 130/92 | HR 73 | Temp 97.9°F | Resp 18 | Wt 144.0 lb

## 2023-09-03 DIAGNOSIS — T7800XD Anaphylactic reaction due to unspecified food, subsequent encounter: Secondary | ICD-10-CM | POA: Diagnosis not present

## 2023-09-03 DIAGNOSIS — X088XXD Exposure to other specified smoke, fire and flames, subsequent encounter: Secondary | ICD-10-CM

## 2023-09-03 DIAGNOSIS — G8929 Other chronic pain: Secondary | ICD-10-CM | POA: Diagnosis not present

## 2023-09-03 DIAGNOSIS — J4550 Severe persistent asthma, uncomplicated: Secondary | ICD-10-CM | POA: Diagnosis not present

## 2023-09-03 DIAGNOSIS — J3089 Other allergic rhinitis: Secondary | ICD-10-CM | POA: Diagnosis not present

## 2023-09-03 DIAGNOSIS — T7800XA Anaphylactic reaction due to unspecified food, initial encounter: Secondary | ICD-10-CM

## 2023-09-03 MED ORDER — SYMBICORT 160-4.5 MCG/ACT IN AERO: 2.0000 | INHALATION_SPRAY | Freq: Two times a day (BID) | RESPIRATORY_TRACT | 5 refills | Status: DC

## 2023-09-03 MED ORDER — MONTELUKAST SODIUM 10 MG PO TABS: 10.0000 mg | ORAL_TABLET | Freq: Every day | ORAL | 1 refills | Status: DC

## 2023-09-03 MED ORDER — LEVOCETIRIZINE DIHYDROCHLORIDE 5 MG PO TABS: 5.0000 mg | ORAL_TABLET | Freq: Every evening | ORAL | 1 refills | Status: AC
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
## 2023-09-03 NOTE — Patient Instructions (Addendum)
 Severe persistent asthma  - Lung testing looks excellent today. - We will change back to the pre-filled syringe since you had the burning with the autoinjector. - Daily controller medication(s): Trelegy 200/62.5/25 one puff once daily in the morning AND Dupixent  300mg  every two weeks - Prior to physical activity: albuterol  2 puffs 10-15 minutes before physical activity. - Rescue medications: albuterol  4 puffs every 4-6 hours as needed - Asthma control goals:  * Full participation in all desired activities (may need albuterol  before activity) * Albuterol  use two time or less a week on average (not counting use with activity) * Cough interfering with sleep two time or less a month * Oral steroids no more than once a year * No hospitalizations  2. Allergic rhinitis (cat dander, tree pollen, weed pollen, grass, and dog) - Continue Xyzal  5mg  to twice daily. - Continue Pataday  one drop per eye daily. - Consider allergy shots for long term control, but this would require weekly visits to the office for a period of time.  - We do offer rush immunotherapy, which gets you through 3 vials in one day. - But this will still require weekly visits for 2-3 months to get to the top dose. - I do not think that this is a viable option at this time.   3. 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 - Continue to avoid all of these items above.  4. 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 - EpiPen  up to date  5. Chronic pain - We are putting in a referral to [facility].  - Hopefully we can get you in to see someone.   6. Return in about 6 months (around 03/04/2024). You can have the follow up appointment with Dr. Idolina Maker or a Nurse Practicioner (our Nurse Practitioners are excellent and always have Physician oversight!).    Please inform us  of any Emergency Department visits, hospitalizations,  or changes in symptoms. Call us  before going to the ED for breathing or allergy symptoms since we might be able to fit you in for a sick visit. Feel free to contact us  anytime with any questions, problems, or concerns.  It was a pleasure to see you again today!  Websites that have reliable patient information: 1. American Academy of Asthma, Allergy, and Immunology: www.aaaai.org 2. Food Allergy Research and Education (FARE): foodallergy.org 3. Mothers of Asthmatics: http://www.asthmacommunitynetwork.org 4. American College of Allergy, Asthma, and Immunology: www.acaai.org      "Like" us  on Facebook and Instagram for our latest updates!      A healthy democracy works best when [employer] participate! Make sure you are registered to vote! If you have moved or changed any of your contact information, you will need to get this updated before voting! Scan the QR codes below to learn more!

## 2023-09-04 ENCOUNTER — Ambulatory Visit: Payer: Self-pay | Admitting: Podiatry

## 2023-09-05 NOTE — Progress Notes (Signed)
 I called the patient and scheduled her for July 17th, which was the first available appointment.  Do we need to try and work her in any sooner, or will this appointment date be ok?  Please advise, Thank you

## 2023-09-18 ENCOUNTER — Encounter (HOSPITAL_COMMUNITY): Payer: Self-pay | Admitting: Internal Medicine

## 2023-09-18 ENCOUNTER — Ambulatory Visit (HOSPITAL_COMMUNITY)

## 2023-09-18 ENCOUNTER — Ambulatory Visit (HOSPITAL_COMMUNITY)
Admission: RE | Admit: 2023-09-18 | Discharge: 2023-09-18 | Disposition: A | Discharge status: Home / Self Care | Attending: Internal Medicine | Admitting: Internal Medicine

## 2023-09-18 ENCOUNTER — Other Ambulatory Visit: Payer: Self-pay

## 2023-09-18 ENCOUNTER — Encounter (HOSPITAL_COMMUNITY)
Admission: RE | Disposition: A | Discharge status: Home / Self Care | Payer: Self-pay | Source: Home / Self Care | Attending: Internal Medicine

## 2023-09-18 DIAGNOSIS — K219 Gastro-esophageal reflux disease without esophagitis: Secondary | ICD-10-CM | POA: Diagnosis not present

## 2023-09-18 DIAGNOSIS — Z8261 Family history of arthritis: Secondary | ICD-10-CM | POA: Insufficient documentation

## 2023-09-18 DIAGNOSIS — M797 Fibromyalgia: Secondary | ICD-10-CM | POA: Diagnosis not present

## 2023-09-18 DIAGNOSIS — K259 Gastric ulcer, unspecified as acute or chronic, without hemorrhage or perforation: Secondary | ICD-10-CM | POA: Insufficient documentation

## 2023-09-18 DIAGNOSIS — K2289 Other specified disease of esophagus: Secondary | ICD-10-CM

## 2023-09-18 DIAGNOSIS — Z8249 Family history of ischemic heart disease and other diseases of the circulatory system: Secondary | ICD-10-CM | POA: Insufficient documentation

## 2023-09-18 DIAGNOSIS — G709 Myoneural disorder, unspecified: Secondary | ICD-10-CM | POA: Diagnosis not present

## 2023-09-18 DIAGNOSIS — M1990 Unspecified osteoarthritis, unspecified site: Secondary | ICD-10-CM

## 2023-09-18 DIAGNOSIS — I10 Essential (primary) hypertension: Secondary | ICD-10-CM | POA: Diagnosis not present

## 2023-09-18 DIAGNOSIS — F418 Other specified anxiety disorders: Secondary | ICD-10-CM | POA: Diagnosis not present

## 2023-09-18 DIAGNOSIS — Z79899 Other long term (current) drug therapy: Secondary | ICD-10-CM | POA: Insufficient documentation

## 2023-09-18 DIAGNOSIS — K269 Duodenal ulcer, unspecified as acute or chronic, without hemorrhage or perforation: Secondary | ICD-10-CM | POA: Diagnosis not present

## 2023-09-18 DIAGNOSIS — J45909 Unspecified asthma, uncomplicated: Secondary | ICD-10-CM | POA: Diagnosis not present

## 2023-09-18 DIAGNOSIS — R1310 Dysphagia, unspecified: Secondary | ICD-10-CM | POA: Insufficient documentation

## 2023-09-18 DIAGNOSIS — K3189 Other diseases of stomach and duodenum: Secondary | ICD-10-CM | POA: Diagnosis not present

## 2023-09-18 HISTORY — PX: ESOPHAGOGASTRODUODENOSCOPY: SHX5428

## 2023-09-18 HISTORY — PX: ESOPHAGEAL DILATION: SHX303

## 2023-09-18 SURGERY — EGD (ESOPHAGOGASTRODUODENOSCOPY)
Anesthesia: General

## 2023-09-18 MED ORDER — PROPOFOL 10 MG/ML IV BOLUS
INTRAVENOUS | Status: DC | PRN
  Administered 2023-09-18: 80 mg via INTRAVENOUS
  Administered 2023-09-18: 125 ug/kg/min via INTRAVENOUS
  Administered 2023-09-18: 20 mg via INTRAVENOUS

## 2023-09-18 MED ORDER — LACTATED RINGERS IV SOLN: INTRAVENOUS | Status: DC

## 2023-09-18 MED ORDER — DEXMEDETOMIDINE HCL IN NACL 80 MCG/20ML IV SOLN
INTRAVENOUS | Status: DC | PRN
  Administered 2023-09-18: 6 ug via INTRAVENOUS

## 2023-09-18 MED ORDER — LACTATED RINGERS IV SOLN: INTRAVENOUS | Status: DC | PRN

## 2023-09-18 MED ORDER — LIDOCAINE 2% (20 MG/ML) 5 ML SYRINGE
INTRAMUSCULAR | Status: DC | PRN
  Administered 2023-09-18: 60 mg via INTRAVENOUS

## 2023-09-18 NOTE — Anesthesia Postprocedure Evaluation (Signed)
 Anesthesia Post Note  Patient: EPHRATA VERVILLE  Procedure(s) Performed: EGD (ESOPHAGOGASTRODUODENOSCOPY) DILATION, ESOPHAGUS  Patient location during evaluation: PACU Anesthesia Type: General Level of consciousness: awake and alert Pain management: pain level controlled Vital Signs Assessment: post-procedure vital signs reviewed and stable Respiratory status: spontaneous breathing, nonlabored ventilation, respiratory function stable and patient connected to nasal cannula oxygen Cardiovascular status: stable and blood pressure returned to baseline Postop Assessment: no apparent nausea or vomiting Anesthetic complications: no   No notable events documented.   Last Vitals:  Vitals:   09/18/23 1327 09/18/23 1333  BP: (!) 89/60 102/88  Pulse: 67 72  Resp: 17 19  Temp: 36.7 C   SpO2: 97% 95%    Last Pain:  Vitals:   09/18/23 1333  TempSrc:   PainSc: 4                  Andrea Limes

## 2023-09-18 NOTE — Interval H&P Note (Signed)
 History and Physical Interval Note:  09/18/2023 1:00 PM  Claire [name]  has presented today for surgery, with the diagnosis of N/V, dysphagia, epig pain, weight loss.  The various methods of treatment have been discussed with the patient and family. After consideration of risks, benefits and other options for treatment, the patient has consented to  Procedure(s) with comments: EGD (ESOPHAGOGASTRODUODENOSCOPY) (N/A) - 1:00 PM, OK RM 1/2 DILATION, ESOPHAGUS (N/A) as a surgical intervention.  The patient's history has been reviewed, patient examined, no change in status, stable for surgery.  I have reviewed the patient's chart and labs.  Questions were answered to the patient's satisfaction.     Claire [name]   nausea vomiting abdominal pain esophageal dysphagia.  Here for diagnostic EGD possible esophageal dilation is feasible/appropriate per plan.  No change.  The risks, benefits, limitations, alternatives and imponderables have been reviewed with the patient. Potential for esophageal dilation, biopsy, etc. have also been reviewed.  Questions have been answered. All parties agreeable.

## 2023-09-18 NOTE — Anesthesia Preprocedure Evaluation (Addendum)
 Anesthesia Evaluation  Patient identified by MRN, date of birth, ID band Patient awake    Reviewed: Allergy & Precautions, H&P , NPO status , Patient's Chart, lab work & pertinent test results  Airway Mallampati: II  TM Distance: >3 FB Neck ROM: Full    Dental no notable dental hx.    Pulmonary asthma    Pulmonary exam normal breath sounds clear to auscultation       Cardiovascular hypertension, Normal cardiovascular exam Rhythm:Regular Rate:Normal     Neuro/Psych  Headaches, Seizures -,  PSYCHIATRIC DISORDERS Anxiety Depression     Neuromuscular disease    GI/Hepatic Neg liver ROS,GERD  ,,  Endo/Other  negative endocrine ROS    Renal/GU negative Renal ROS  negative genitourinary   Musculoskeletal  (+) Arthritis ,  Fibromyalgia -  Abdominal   Peds negative pediatric ROS (+)  Hematology negative hematology ROS (+)   Anesthesia Other Findings   Reproductive/Obstetrics negative OB ROS                              Anesthesia Physical Anesthesia Plan  ASA: 3  Anesthesia Plan: General   Post-op Pain Management:    Induction: Intravenous  PONV Risk Score and Plan: Propofol  infusion  Airway Management Planned: Nasal Cannula  Additional Equipment:   Intra-op Plan:   Post-operative Plan:   Informed Consent: I have reviewed the patients History and Physical, chart, labs and discussed the procedure including the risks, benefits and alternatives for the proposed anesthesia with the patient or authorized representative who has indicated his/her understanding and acceptance.     Dental advisory given  Plan Discussed with: CRNA  Anesthesia Plan Comments:         Anesthesia Quick Evaluation

## 2023-09-18 NOTE — Discharge Instructions (Addendum)
 EGD Discharge instructions Please read the instructions outlined below and refer to this sheet in the next few weeks. These discharge instructions provide you with general information on caring for yourself after you leave the hospital. Your doctor may also give you specific instructions. While your treatment has been planned according to the most current medical practices available, unavoidable complications occasionally occur. If you have any problems or questions after discharge, please call your doctor. ACTIVITY You may resume your regular activity but move at a slower pace for the next 24 hours.  Take frequent rest periods for the next 24 hours.  Walking will help expel (get rid of) the air and reduce the bloated feeling in your abdomen.  No driving for 24 hours (because of the anesthesia (medicine) used during the test).  You may shower.  Do not sign any important legal documents or operate any machinery for 24 hours (because of the anesthesia used during the test).  NUTRITION Drink plenty of fluids.  You may resume your normal diet.  Begin with a light meal and progress to your normal diet.  Avoid alcoholic beverages for 24 hours or as instructed by your caregiver.  MEDICATIONS You may resume your normal medications unless your caregiver tells you otherwise.  WHAT YOU CAN EXPECT TODAY You may experience abdominal discomfort such as a feeling of fullness or "gas" pains.  FOLLOW-UP Your doctor will discuss the results of your test with you.  SEEK IMMEDIATE MEDICAL ATTENTION IF ANY OF THE FOLLOWING OCCUR: Excessive nausea (feeling sick to your stomach) and/or vomiting.  Severe abdominal pain and distention (swelling).  Trouble swallowing.  Temperature over 101 F (37.8 C).  Rectal bleeding or vomiting of blood.      You have multiple areas of erosion in your stomach and a large ulcer in your duodenum.  Most likely due to Mobic /ibuprofen    you must  stop taking all forms of NSAIDs  (ibuprofen /Mobic  and many others out there on the market)   continue omeprazole  40 mg twice daily best taken 30 minutes before   breakfast and supper.  Esophagus looks better today.  It was stretched.  Biopsies taken.    Further recommendations to follow pending review of pathology report  Office visit with Claire [name] in 1 month.  Office will notify you.

## 2023-09-18 NOTE — Op Note (Addendum)
 [facility] Patient Name: Claire [name] Procedure Date: 09/18/2023 12:08 PM MRN: [id number] Date of Birth: August 17, 1970 Attending MD: Lamar Ozell Hollingshead , MD, [id number] CSN: [id number] Age: 53 Admit Type: Outpatient Procedure:                Upper GI endoscopy Indications:              Dysphagia, Nausea with vomiting; takes Cheech and                            Chong cannabis beverage with transient improvement                            in nausea. Providers:                Lamar Ozell Hollingshead, MD, Olam Ada, RN, Jon Loge Referring MD:              Medicines:                Propofol  per Anesthesia Complications:            No immediate complications. Estimated Blood Loss:     Estimated blood loss was minimal. Estimated blood                            loss was minimal. Procedure:                Pre-Anesthesia Assessment:                           - Prior to the procedure, a History and Physical                            was performed, and patient medications and                            allergies were reviewed. The patient's tolerance of                            previous anesthesia was also reviewed. The risks                            and benefits of the procedure and the sedation                            options and risks were discussed with the patient.                            All questions were answered, and informed consent                            was obtained. Prior Anticoagulants: The patient has                            taken  no anticoagulant or antiplatelet agents. ASA                            Grade Assessment: III - A patient with severe                            systemic disease. After reviewing the risks and                            benefits, the patient was deemed in satisfactory                            condition to undergo the procedure.                           After obtaining informed consent, the  endoscope was                            passed under direct vision. Throughout the                            procedure, the patient's blood pressure, pulse, and                            oxygen saturations were monitored continuously. The                            GIF-H190 ([id number]) scope was introduced through the                            mouth, and advanced to the second part of duodenum.                            The upper GI endoscopy was accomplished without                            difficulty. The patient tolerated the procedure                            well. Scope In: 1:11:51 PM Scope Out: 1:19:18 PM Total Procedure Duration: 0 hours 7 minutes 27 seconds  Findings:      The examined esophagus was normal.      Multiple antral erosions but no ulcer or infiltrating process observed.      Examination of the duodenal bulb revealed a 1.5 cm ulcer crater with       flat pigmented spots present. Remainder of D1 and D2 appeared normal.       The scope was withdrawn. Dilation was performed with a Maloney dilator       with mild resistance at 50 Fr. The dilation site was examined following       endoscope reinsertion and showed mild mucosal disruption. Estimated       blood loss was minimal. biopsies of the gastric antrum taken for       histologic study. Finally, biopsies of the distal and midesophagus were  taken for histologic study Impression:               - Normal esophagus. Dilated.                           - Gastric erosions. Large bulbar ulcer as                            described. Status post gastric and esophageal                            biopsies                           - Ulcer likely NSAID related. Moderate Sedation:      Moderate (conscious) sedation was personally administered by an       anesthesia professional. The following parameters were monitored: oxygen       saturation, heart rate, blood pressure, respiratory rate, EKG, adequacy       of  pulmonary ventilation, and response to care. Recommendation:           - Patient has a contact number available for                            emergencies. The signs and symptoms of potential                            delayed complications were discussed with the                            patient. Return to normal activities tomorrow.                            Written discharge instructions were provided to the                            patient.                           - Advance diet as tolerated. Continue omeprazole  40                            mg twice daily 30 minutes before breakfast and                            supper. Absolutely refrain from taking all forms of                            NSAIDs including ibuprofen /Mobic . Follow-up on                            path. Office visit with us  in 1 month. Procedure Code(s):        --- Professional ---                           [phone], Esophagogastroduodenoscopy, flexible,  transoral; diagnostic, including collection of                            specimen(s) by brushing or washing, when performed                            (separate procedure)                           43450, Dilation of esophagus, by unguided sound or                            bougie, single or multiple passes Diagnosis Code(s):        --- Professional ---                           R13.10, Dysphagia, unspecified                           R11.2, Nausea with vomiting, unspecified CPT copyright 2022 American Medical Association. All rights reserved. The codes documented in this report are preliminary and upon coder review may  be revised to meet current compliance requirements. Lamar HERO. Erubiel Manasco, MD Lamar Ozell Hollingshead, MD 09/18/2023 1:29:33 PM This report has been signed electronically. Number of Addenda: 0

## 2023-09-18 NOTE — Anesthesia Procedure Notes (Signed)
 Date/Time: 09/18/2023 1:04 PM  Performed by: Para Jerelene CROME, CRNAOxygen Delivery Method: Nasal cannula Comments: OptiFlow Nasal Cannula.

## 2023-09-18 NOTE — Transfer of Care (Addendum)
 Immediate Anesthesia Transfer of Care Note  Patient: Claire [name]  Procedure(s) Performed: EGD (ESOPHAGOGASTRODUODENOSCOPY) DILATION, ESOPHAGUS  Patient Location: Endoscopy Unit  Anesthesia Type:General  Level of Consciousness: drowsy and patient cooperative  Airway & Oxygen Therapy: Patient Spontanous Breathing  Post-op Assessment: Report given to RN and Post -op Vital signs reviewed and stable  Post vital signs: Reviewed and stable  Last Vitals:  Vitals Value Taken Time  BP 89/60 09/18/23 13:27  Temp 36.7 C 09/18/23 13:27  Pulse 67 09/18/23 13:27  Resp 17 09/18/23 13:27  SpO2 97 % 09/18/23 13:27    Last Pain:  Vitals:   09/18/23 1327  TempSrc: Axillary  PainSc:       Patients Stated Pain Goal: 7 (09/18/23 1302)  Complications: Subsequent blood pressure 102/88 at 1333.

## 2023-09-22 ENCOUNTER — Encounter (HOSPITAL_COMMUNITY): Payer: Self-pay | Admitting: Internal Medicine

## 2023-09-22 LAB — SURGICAL PATHOLOGY

## 2023-09-23 ENCOUNTER — Ambulatory Visit: Payer: Self-pay | Admitting: Internal Medicine

## 2023-10-01 ENCOUNTER — Telehealth: Payer: Self-pay | Admitting: Allergy & Immunology

## 2023-10-01 NOTE — Telephone Encounter (Signed)
 PT requesting a call back about her medication.

## 2023-10-02 ENCOUNTER — Ambulatory Visit: Admitting: Podiatry

## 2023-10-02 ENCOUNTER — Encounter: Payer: Self-pay | Admitting: Podiatry

## 2023-10-02 DIAGNOSIS — M76822 Posterior tibial tendinitis, left leg: Secondary | ICD-10-CM | POA: Diagnosis not present

## 2023-10-02 DIAGNOSIS — S96912D Strain of unspecified muscle and tendon at ankle and foot level, left foot, subsequent encounter: Secondary | ICD-10-CM

## 2023-10-02 DIAGNOSIS — M7752 Other enthesopathy of left foot: Secondary | ICD-10-CM | POA: Diagnosis not present

## 2023-10-02 DIAGNOSIS — S93691A Other sprain of right foot, initial encounter: Secondary | ICD-10-CM

## 2023-10-02 MED ORDER — TRIAMCINOLONE ACETONIDE 40 MG/ML IJ SUSP
40.0000 mg | Freq: Once | INTRAMUSCULAR | Status: AC
  Administered 2023-10-02: 40 mg

## 2023-10-05 NOTE — Progress Notes (Signed)
 She presents today states that she has not had any improvement in her symptoms that she is referring to her plantar fascia area.  Objective: No change in physical exam MRI does demonstrate mild intra muscular tear of the proximal quadratus plantae musculature and also demonstrates some degenerative changes in the medial talar dome consistent with osteoarthritis.  She also has degenerative changes to the 2nd and 3rd tarsometatarsal joints.  Assessment: Most likely the plantar fasciitis type symptoms that she is feeling is that here in the quadratus plantae.  She does have some osteoarthritic changes in the midfoot and ankle joint.  Plan: Scheduling her with physical therapy she will follow-up with me after that

## 2023-10-09 ENCOUNTER — Telehealth: Payer: Self-pay

## 2023-10-09 NOTE — Telephone Encounter (Signed)
 Spoke with pt and she is currently at the ED for evaluation.

## 2023-10-09 NOTE — Telephone Encounter (Signed)
 Pt called stating that the abdominal cramping and nausea/vomiting has gotten worse. Pt states that she feels like she has to have a bm and nothing is coming out. Pt also stated that she has been cold and sweating and is wanting to know what you suggest. Pt has no fever.

## 2023-10-09 NOTE — Telephone Encounter (Signed)
 noted

## 2023-10-09 NOTE — Telephone Encounter (Signed)
 Claire [name], let me know how frequently she is having a BM. Is she keeping down some liquids, is she able to urinate, any signs of dehydration like lightheadedness, diminished urination?  Is her abdominal pain constant, or comes and goes?  If she is using pepto have her stop. This will increase constipation. She can use a fleet enema today if needed.   Continue omeprazole  twice daily.  She can use pepcid at bedtime.   Continue Linzess  daily.  I can send in carafate susp qac/at bedtime.  I can send in zofran .   NO NSAIDS or ASA powders.   Keep ov as scheduled.   If significant pain, cannot keep anything down, she will need to go to the ED.

## 2023-10-10 NOTE — Telephone Encounter (Signed)
 Patient called the office about her Dupixent  injection. She stated she was sent the pen and it cause burning/ stinging when she injected it. She was able to get it switched back to the syringe.    Patient has no further questions or concerns.

## 2023-10-13 ENCOUNTER — Telehealth: Payer: Self-pay | Admitting: Allergy & Immunology

## 2023-10-13 ENCOUNTER — Encounter: Payer: Self-pay | Admitting: Physical Therapy

## 2023-10-13 ENCOUNTER — Ambulatory Visit: Attending: Podiatry | Admitting: Physical Therapy

## 2023-10-13 DIAGNOSIS — M25571 Pain in right ankle and joints of right foot: Secondary | ICD-10-CM | POA: Insufficient documentation

## 2023-10-13 DIAGNOSIS — S96912D Strain of unspecified muscle and tendon at ankle and foot level, left foot, subsequent encounter: Secondary | ICD-10-CM | POA: Insufficient documentation

## 2023-10-13 DIAGNOSIS — R262 Difficulty in walking, not elsewhere classified: Secondary | ICD-10-CM | POA: Diagnosis present

## 2023-10-13 DIAGNOSIS — M6281 Muscle weakness (generalized): Secondary | ICD-10-CM | POA: Insufficient documentation

## 2023-10-13 DIAGNOSIS — M25572 Pain in left ankle and joints of left foot: Secondary | ICD-10-CM | POA: Insufficient documentation

## 2023-10-13 DIAGNOSIS — S93691A Other sprain of right foot, initial encounter: Secondary | ICD-10-CM | POA: Diagnosis not present

## 2023-10-13 NOTE — Telephone Encounter (Signed)
 Pt called to an stated she hasn't heard anything about the pain referral and requested a call back.
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
## 2023-10-13 NOTE — Telephone Encounter (Signed)
 Reviewed records from [facility] ED visit dated October 09, 2023.  CT abdomen and pelvis with IV contrast showed status postcholecystectomy with suspected compensatory enlargement of the CBD at 12 mm which is stable, similar to prior CT in June 2023.  Patient's known duodenal ulcer is not visualized on current CT exam.  Appendix is normal.  Urinalysis with 30 protein, 100 glucose, 15 ketones, small bilirubin, trace blood, moderate mucus.  Rigor blood cell count 13.1, hemoglobin 15.8, MCV 87.3, platelets 133, sodium 142, potassium 3.4, BUN 15, creatinine 0.74, albumin 4.4, total bilirubin 0.7, AST 13, ALT 33, alk phos 104, lipase 19.   Patient has upcoming office visit this week.

## 2023-10-15 ENCOUNTER — Encounter: Payer: Self-pay | Admitting: Gastroenterology

## 2023-10-15 ENCOUNTER — Ambulatory Visit (INDEPENDENT_AMBULATORY_CARE_PROVIDER_SITE_OTHER): Admitting: Gastroenterology

## 2023-10-15 VITALS — BP 115/79 | HR 71 | Temp 98.7°F | Ht 63.0 in | Wt 139.2 lb

## 2023-10-15 DIAGNOSIS — K581 Irritable bowel syndrome with constipation: Secondary | ICD-10-CM | POA: Diagnosis not present

## 2023-10-15 DIAGNOSIS — K259 Gastric ulcer, unspecified as acute or chronic, without hemorrhage or perforation: Secondary | ICD-10-CM | POA: Diagnosis not present

## 2023-10-15 DIAGNOSIS — K269 Duodenal ulcer, unspecified as acute or chronic, without hemorrhage or perforation: Secondary | ICD-10-CM | POA: Insufficient documentation

## 2023-10-15 DIAGNOSIS — K219 Gastro-esophageal reflux disease without esophagitis: Secondary | ICD-10-CM | POA: Insufficient documentation

## 2023-10-15 DIAGNOSIS — R634 Abnormal weight loss: Secondary | ICD-10-CM

## 2023-10-15 DIAGNOSIS — R112 Nausea with vomiting, unspecified: Secondary | ICD-10-CM

## 2023-10-15 DIAGNOSIS — K2100 Gastro-esophageal reflux disease with esophagitis, without bleeding: Secondary | ICD-10-CM

## 2023-10-15 DIAGNOSIS — K5900 Constipation, unspecified: Secondary | ICD-10-CM

## 2023-10-15 MED ORDER — SUCRALFATE 1 GM/10ML PO SUSP: 1.0000 g | Freq: Four times a day (QID) | ORAL | 1 refills | Status: AC

## 2023-10-15 MED ORDER — TRULANCE 3 MG PO TABS: 3.0000 mg | ORAL_TABLET | Freq: Every day | ORAL | 5 refills | Status: DC
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
## 2023-10-15 NOTE — Patient Instructions (Signed)
 Stop Linzess . Start Trulance  3mg  daily.  Continue omeprazole  40mg  twice daily before a meal.  Add sulcralfate 1 gram four times daily. This will be just for 2-3 weeks.   Try to eat small portions of food and liquid throughout the day to maintain your weight. Bland foods, low fat foods are easiest to manage. Try cooked veggies over raw veggies as they may be easier to digest.   Avoid NSAIDs, ie ibuprofen , naprosyn, aspirin powders.  Return in two months or reach out sooner if not feeling better.

## 2023-10-16 ENCOUNTER — Encounter: Payer: Self-pay | Admitting: Physical Therapy

## 2023-10-16 ENCOUNTER — Ambulatory Visit: Admitting: Physical Therapy

## 2023-10-16 DIAGNOSIS — R262 Difficulty in walking, not elsewhere classified: Secondary | ICD-10-CM

## 2023-10-16 DIAGNOSIS — M25572 Pain in left ankle and joints of left foot: Secondary | ICD-10-CM

## 2023-10-16 DIAGNOSIS — M25571 Pain in right ankle and joints of right foot: Secondary | ICD-10-CM

## 2023-10-16 DIAGNOSIS — M6281 Muscle weakness (generalized): Secondary | ICD-10-CM

## 2023-10-16 NOTE — Therapy (Signed)
 OUTPATIENT PHYSICAL THERAPY TREATMENT   Patient Name: Claire [name] MRN: [id number] DOB:December 27, 1970, 53 y.o., female Today's Date: 10/16/2023  END OF SESSION:  PT End of Session - 10/16/23 1541     Visit Number 2    Number of Visits 6    Date for PT Re-Evaluation 11/24/23    Authorization Type [employer] MCR, no auth needed per appointment notes    PT Start Time 1515    PT Stop Time 1600    PT Time Calculation (min) 45 min    Activity Tolerance Patient tolerated treatment well    Behavior During Therapy WFL for tasks assessed/performed          Past Medical History:  Diagnosis Date   Acid reflux    Allergy    pollen, dust   Anxiety    Arthritis    Asthma    Carpal tunnel syndrome    bilateral   DDD (degenerative disc disease), cervical    Depression    Fibromyalgia    Hypertension    Hypokalemia    Migraine    Palpitations    Panic attacks    PTSD (post-traumatic stress disorder)    Seizures (HCC)    unknown etiology; last seizure was 2 years ago; on meds.   Syncope and collapse    Tennis elbow    Ulcer    Vertigo    Past Surgical History:  Procedure Laterality Date   BACK SURGERY  1993   BIOPSY  10/21/2016   Procedure: BIOPSY;  Surgeon: Shaaron Lamar HERO, MD;  Location: [facility];  Service: Endoscopy;;  gastric, esophagus   CARPAL TUNNEL RELEASE Left    2019   CHOLECYSTECTOMY     ESOPHAGEAL DILATION N/A 09/18/2023   Procedure: DILATION, ESOPHAGUS;  Surgeon: Shaaron Lamar HERO, MD;  Location: [facility];  Service: Endoscopy;  Laterality: N/A;   ESOPHAGOGASTRODUODENOSCOPY N/A 09/18/2023   Procedure: EGD (ESOPHAGOGASTRODUODENOSCOPY);  Surgeon: Shaaron Lamar HERO, MD;  Location: [facility];  Service: Endoscopy;  Laterality: N/A;  1:00 PM, OK RM 1/2   ESOPHAGOGASTRODUODENOSCOPY (EGD) WITH PROPOFOL  N/A 10/21/2016   Procedure: ESOPHAGOGASTRODUODENOSCOPY (EGD) WITH PROPOFOL ;  Surgeon: Shaaron Lamar HERO, MD;  Location: [facility];  Service: Endoscopy;  Laterality: N/A;   9:30am   GALLBLADDER SURGERY     SHOULDER SURGERY Right    SPINE SURGERY  1993   lumbar disc surgery   Patient Active Problem List   Diagnosis Date Noted   GERD (gastroesophageal reflux disease) 10/15/2023   Duodenal ulcer disease 10/15/2023   Nausea with vomiting 08/27/2023   Abdominal pain, epigastric 08/27/2023   Chronic RLQ pain 08/27/2023   Migraine    Lung nodule 04/12/2022   Severe persistent asthma without complication 04/12/2022   PTSD (post-traumatic stress disorder) 05/10/2019   Eosinophilic esophagitis 12/02/2016   H. pylori infection 12/02/2016   Dysphagia 07/03/2016   Constipation 07/03/2016   Abdominal pain 07/03/2016   Generalized anxiety disorder 06/17/2016   Panic disorder 06/17/2016   Asthma in adult, mild intermittent, uncomplicated 06/11/2016   GERD without esophagitis 06/11/2016   TMJ arthropathy 06/11/2016   Frequent falls 06/11/2016   Vertigo 06/11/2016   History of syncope 06/11/2016   Fibromyalgia 07/28/2014   Seizure-like activity (HCC) 07/29/2012    PCP: Alston Silvio JAYSON, FNP   REFERRING PROVIDER: Verta Royden DASEN, DPM   REFERRING DIAG:  [phone] (ICD-10-CM) - Tear of tendon of left ankle, subsequent encounter  [phone] (ICD-10-CM) - Rupture of plantar fascia of  right foot, initial encounter    Rationale for Evaluation and Treatment:  Rehabiliation  THERAPY DIAG:  Pain in left ankle and joints of left foot  Pain in right ankle and joints of right foot  Muscle weakness (generalized)  Difficulty in walking, not elsewhere classified  ONSET DATE: 5 years of pain   SUBJECTIVE:                                                                                                                                                                                           SUBJECTIVE STATEMENT: She says pain is bad today, her fibromyalgia is flared up.   PERTINENT HISTORY:  See above PMH  PAIN:  NPRS scale:8/10 upon arrival Pain location:plantar  part of foot up to her ankle Pain description: burning, ache, feels like it wants to break Aggravating factors: standing, walking,  Relieving factors: hot shower   PRECAUTIONS: ,  None  RED FLAGS: None   WEIGHT BEARING RESTRICTIONS:  No  FALLS:  Has patient fallen in last 6 months? No   OCCUPATION:  disabled  PLOF:  Independent with basic ADLs  PATIENT GOALS:  Help relieve the pain  OBJECTIVE:  Note: Objective measures were completed at Evaluation unless otherwise noted.  DIAGNOSTIC FINDINGS:  08/25/23 MRI Left ankle MPRESSION: Strain/mild intramuscular tear to the proximal quadratus plantae musculature. No planar fasciitis.   Mild focal degenerative change to the medial talar dome.   Mild degenerative change to the second and third tarsometatarsal joints.  MRI right ankle IMPRESSION: Unremarkable exam  PATIENT SURVEYS:  Patient-Specific Activity Scoring Scheme  0 represents "unable to perform." 10 represents "able to perform at prior level. 0 1 2 3 4 5 6 7 8 9  10 (Date and Score)   Activity Eval     1. standing  2    2. walking  2    3.     4.    5.    Score 2/10    Total score = sum of the activity scores/number of activities Minimum detectable change (90%CI) for average score = 2 points Minimum detectable change (90%CI) for single activity score = 3 points   GAIT:  Eval: Distance walked: 25 feet Assistive device utilized: Single point cane Level of assistance: Modified independence Comments: increased foot pronation in left, decreased gait speed, increased pain with walking  Ankle  PALPATION: Pain and tenderness in arch of foot  LOWER EXTREMITY ROM:     Active  Left eval Right eval  Hip flexion    Hip extension    Hip abduction    Hip adduction    Hip  internal rotation    Hip external rotation    Knee flexion    Knee extension    Ankle dorsiflexion -2 from neutral 5  Ankle plantarflexion 40 50  Ankle inversion WNL WNL   Ankle eversion 15 20   (Blank rows = not tested)   LOWER EXTREMITY MMT:    MMT Right eval Left eval  Hip flexion    Hip extension    Hip abduction    Hip adduction    Hip internal rotation    Hip external rotation    Knee flexion    Knee extension    Ankle dorsiflexion 5 4  Ankle plantarflexion 5 in NWB 4 in NWB  Ankle inversion 5 4  Ankle eversion 5 4   (Blank rows = not tested)   FUNCTIONAL TESTS:  Eval: Tandem balance test: 4 second average with left foot posterior, 9 second average with right foot posterior.                                                                                                                               TREATMENT DATE:  10/16/23 Therex Nu step L5 X 10 min UE/LE Standing gastroc stretch caused pain so modified it to sitting with strap 15 sec X 2 bilat, then soleus stretch 15 sec X 2, same set up Seated heel and toe raises with UE resistance 2X10 Seated foot Eversion red 2X10 bilat Seated foot inversion isometric with small ball between feet 5 sec 2X10 Long sitting plantar fascia stretch with towel 10 sec X 3 bilat True X ride seated elliptical X 5 min L1  KT tape to bilat plantarfascia, gastroc-soleus and also left tibialis anterior      PATIENT EDUCATION: Education details: HEP, PT plan of care, selfcare Person educated: Patient Education method: Explanation, Demonstration, Verbal cues, and Handouts Education comprehension: verbalized understanding, further education recommended   HOME EXERCISE PROGRAM: Access Code: [email or web address] URL: [email or web address] Date: 10/13/2023 Prepared by: Redell Moose  Exercises - Gastroc Stretch on Wall  - 1-2 x daily - 3 x weekly - 1 sets - 3 reps - 30 hold - Soleus Stretch on Wall  - 1-2 x daily - 6 x weekly - 1 sets - 3 reps - 30 hold - Heel Toe Raises with Counter Support  - 1-2 x daily - 6 x weekly - 2 sets - 10 reps - Standing Tandem Balance with Counter Support  - 1-2 x  daily - 6 x weekly - 1 sets - 3 reps - 15 sec hold - Seated Ankle Plantarflexion with Resistance  - 1-2 x daily - 6 x weekly - 2 sets - 10 reps - Seated Ankle Eversion with Resistance  - 1-2 x daily - 6 x weekly - 2 sets - 10 reps - Seated Ankle Inversion with Resistance  - 1-2 x daily - 6 x weekly - 2 sets - 10 reps - Long Sitting Plantar Fascia Stretch with  Towel  - 1-2 x daily - 6 x weekly - 2 sets - 10 reps  ASSESSMENT:  CLINICAL IMPRESSION: HEP was reviewed and I did modify some to make them less painful. She shows good understanding of this and shows good early compliance with the exercises. She felt KT tape helped so this was continued again today.  OBJECTIVE IMPAIRMENTS: decreased activity tolerance for ADL's, difficulty walking, decreased balance, decreased endurance, decreased mobility, decreased ROM, decreased strength, impaired flexibility, impaired LE use, and pain.  ACTIVITY LIMITATIONS: standing, walking, cleaning, community activity, driving,  PERSONAL FACTORS: see above PMH  also affecting patient's functional outcome.  REHAB POTENTIAL: Fair    CLINICAL DECISION MAKING: Evolving/moderate complexity  EVALUATION COMPLEXITY: Moderate    GOALS: Short term PT Goals Target date: 11/10/2023   Pt will be I and compliant with HEP. Baseline:  Goal status: New Pt will decrease pain by 25% overall Baseline:8 Goal status: New  Long term PT goals Target date:11/24/2023  Pt will improve left ankle  DF ROM to [facility] >5 deg to improve functional mobility Baseline: Goal status: New Pt will improve  strength to at least 4+/5 MMT to improve functional strength Baseline: Goal status: New Pt will improve Patient specific functional scale (PSFS) to at least 5/10 to show improved function level Baseline:2/10 Goal status: New Pt will reduce pain to overall less than 4/10 with usual activity and work activity. Baseline:8/10 Goal status: New Pt will be able to ambulate community  distances at least 500 ft without complaints Baseline: Goal status: New  PLAN: PT FREQUENCY: 1-2 times per week   PT DURATION: 6-8 weeks  PLANNED INTERVENTIONS (unless contraindicated): 97110-Therapeutic exercises, 97530- Therapeutic activity, [id number]- Neuromuscular re-education, 97535- Self Care, 02859- Manual therapy, [id number]- Gait training, [id number]- Electrical stimulation (unattended), 97035- Ultrasound, 02966- Ionotophoresis 4mg /ml Dexamethasone , and Taping  PLAN FOR NEXT SESSION: review HEP, needs ROM, strength as tolerated, modalaties PRN.  KT tape if desired.  NEXT MD VISIT: not scheduled at eval  Redell JONELLE Moose, PT,DPT 10/16/2023, 4:14 PM

## 2023-10-23 ENCOUNTER — Ambulatory Visit: Attending: Podiatry | Admitting: Physical Therapy

## 2023-10-23 DIAGNOSIS — M25571 Pain in right ankle and joints of right foot: Secondary | ICD-10-CM | POA: Insufficient documentation

## 2023-10-23 DIAGNOSIS — R262 Difficulty in walking, not elsewhere classified: Secondary | ICD-10-CM | POA: Insufficient documentation

## 2023-10-23 DIAGNOSIS — M25572 Pain in left ankle and joints of left foot: Secondary | ICD-10-CM | POA: Insufficient documentation

## 2023-10-23 DIAGNOSIS — M6281 Muscle weakness (generalized): Secondary | ICD-10-CM | POA: Insufficient documentation

## 2023-10-23 NOTE — Therapy (Unsigned)
 OUTPATIENT PHYSICAL THERAPY TREATMENT   Patient Name: Claire [name] MRN: [id number] DOB:09-07-1970, 53 y.o., female Today's Date: 10/24/2023  END OF SESSION:  PT End of Session - 10/24/23 1128     Visit Number 3    Number of Visits 6    Date for PT Re-Evaluation 11/24/23    Authorization Type [employer] MCR, no auth needed per appointment notes    PT Start Time 1508    PT Stop Time 1533    PT Time Calculation (min) 25 min    Activity Tolerance Patient tolerated treatment well    Behavior During Therapy WFL for tasks assessed/performed           Past Medical History:  Diagnosis Date   Acid reflux    Allergy    pollen, dust   Anxiety    Arthritis    Asthma    Carpal tunnel syndrome    bilateral   DDD (degenerative disc disease), cervical    Depression    Fibromyalgia    Hypertension    Hypokalemia    Migraine    Palpitations    Panic attacks    PTSD (post-traumatic stress disorder)    Seizures (HCC)    unknown etiology; last seizure was 2 years ago; on meds.   Syncope and collapse    Tennis elbow    Ulcer    Vertigo    Past Surgical History:  Procedure Laterality Date   BACK SURGERY  1993   BIOPSY  10/21/2016   Procedure: BIOPSY;  Surgeon: Shaaron Lamar HERO, MD;  [facility]: [facility];  Service: Endoscopy;;  gastric, esophagus   CARPAL TUNNEL RELEASE Left    2019   CHOLECYSTECTOMY     ESOPHAGEAL DILATION N/A 09/18/2023   Procedure: DILATION, ESOPHAGUS;  Surgeon: Shaaron Lamar HERO, MD;  Location: [facility];  Service: Endoscopy;  Laterality: N/A;   ESOPHAGOGASTRODUODENOSCOPY N/A 09/18/2023   Procedure: EGD (ESOPHAGOGASTRODUODENOSCOPY);  Surgeon: Shaaron Lamar HERO, MD;  Location: [facility];  Service: Endoscopy;  Laterality: N/A;  1:00 PM, OK RM 1/2   ESOPHAGOGASTRODUODENOSCOPY (EGD) WITH PROPOFOL  N/A 10/21/2016   Procedure: ESOPHAGOGASTRODUODENOSCOPY (EGD) WITH PROPOFOL ;  Surgeon: Shaaron Lamar HERO, MD;  [facility]: [facility];  Service: Endoscopy;  Laterality: N/A;   9:30am   GALLBLADDER SURGERY     SHOULDER SURGERY Right    SPINE SURGERY  1993   lumbar disc surgery   Patient Active Problem List   Diagnosis Date Noted   GERD (gastroesophageal reflux disease) 10/15/2023   Duodenal ulcer disease 10/15/2023   Nausea with vomiting 08/27/2023   Abdominal pain, epigastric 08/27/2023   Chronic RLQ pain 08/27/2023   Migraine    Lung nodule 04/12/2022   Severe persistent asthma without complication 04/12/2022   PTSD (post-traumatic stress disorder) 05/10/2019   Eosinophilic esophagitis 12/02/2016   H. pylori infection 12/02/2016   Dysphagia 07/03/2016   Constipation 07/03/2016   Abdominal pain 07/03/2016   Generalized anxiety disorder 06/17/2016   Panic disorder 06/17/2016   Asthma in adult, mild intermittent, uncomplicated 06/11/2016   GERD without esophagitis 06/11/2016   TMJ arthropathy 06/11/2016   Frequent falls 06/11/2016   Vertigo 06/11/2016   History of syncope 06/11/2016   Fibromyalgia 07/28/2014   Seizure-like activity (HCC) 07/29/2012    PCP: Alston Silvio JAYSON, FNP   REFERRING PROVIDER: Verta Royden DASEN, DPM   REFERRING DIAG:  [phone] (ICD-10-CM) - Tear of tendon of left ankle, subsequent encounter  [phone] (ICD-10-CM) - Rupture of plantar fascia  of right foot, initial encounter    Rationale for Evaluation and Treatment:  Rehabiliation  THERAPY DIAG:  Pain in left ankle and joints of left foot  Pain in right ankle and joints of right foot  Muscle weakness (generalized)  Difficulty in walking, not elsewhere classified  ONSET DATE: 5 years of pain   SUBJECTIVE:                                                                                                                                                                                           SUBJECTIVE STATEMENT: She says pain is bad today, her fibromyalgia is still flared up. She is very tired and requests a shorter PT session today  PERTINENT HISTORY:  See above  PMH  PAIN:  NPRS scale:8/10 upon arrival Pain location:plantar part of foot up to her ankle Pain description: burning, ache, feels like it wants to break Aggravating factors: standing, walking,  Relieving factors: hot shower   PRECAUTIONS: ,  None  RED FLAGS: None   WEIGHT BEARING RESTRICTIONS:  No  FALLS:  Has patient fallen in last 6 months? No   OCCUPATION:  disabled  PLOF:  Independent with basic ADLs  PATIENT GOALS:  Help relieve the pain  OBJECTIVE:  Note: Objective measures were completed at Evaluation unless otherwise noted.  DIAGNOSTIC FINDINGS:  08/25/23 MRI Left ankle MPRESSION: Strain/mild intramuscular tear to the proximal quadratus plantae musculature. No planar fasciitis.   Mild focal degenerative change to the medial talar dome.   Mild degenerative change to the second and third tarsometatarsal joints.  MRI right ankle IMPRESSION: Unremarkable exam  PATIENT SURVEYS:  Patient-Specific Activity Scoring Scheme  0 represents "unable to perform." 10 represents "able to perform at prior level. 0 1 2 3 4 5 6 7 8 9  10 (Date and Score)   Activity Eval     1. standing  2    2. walking  2    3.     4.    5.    Score 2/10    Total score = sum of the activity scores/number of activities Minimum detectable change (90%CI) for average score = 2 points Minimum detectable change (90%CI) for single activity score = 3 points   GAIT:  Eval: Distance walked: 25 feet Assistive device utilized: Single point cane Level of assistance: Modified independence Comments: increased foot pronation in left, decreased gait speed, increased pain with walking  Ankle  PALPATION: Pain and tenderness in arch of foot  LOWER EXTREMITY ROM:     Active  Left eval Right eval  Hip flexion    Hip extension  Hip abduction    Hip adduction    Hip internal rotation    Hip external rotation    Knee flexion    Knee extension    Ankle dorsiflexion -2 from  neutral 5  Ankle plantarflexion 40 50  Ankle inversion WNL WNL  Ankle eversion 15 20   (Blank rows = not tested)   LOWER EXTREMITY MMT:    MMT Right eval Left eval  Hip flexion    Hip extension    Hip abduction    Hip adduction    Hip internal rotation    Hip external rotation    Knee flexion    Knee extension    Ankle dorsiflexion 5 4  Ankle plantarflexion 5 in NWB 4 in NWB  Ankle inversion 5 4  Ankle eversion 5 4   (Blank rows = not tested)   FUNCTIONAL TESTS:  Eval: Tandem balance test: 4 second average with left foot posterior, 9 second average with right foot posterior.                                                                                                                               TREATMENT DATE:  10/23/23 Therex Nu step L5 X 10 min UE/LE gastroc stretch sitting with strap 15 sec X 2 bilat, then soleus stretch 15 sec X 2, same set up Seated heel and toe raises X10 reps Seated foot inversion isometric with small ball between feet 5 sec X10 Seated rocker board X 10 A-O, X 10 lateral, X 10 cirlces  10/16/23 Therex Nu step L5 X 10 min UE/LE Standing gastroc stretch caused pain so modified it to sitting with strap 15 sec X 2 bilat, then soleus stretch 15 sec X 2, same set up Seated heel and toe raises with UE resistance 2X10 Seated foot Eversion red 2X10 bilat Seated foot inversion isometric with small ball between feet 5 sec 2X10 Long sitting plantar fascia stretch with towel 10 sec X 3 bilat True X ride seated elliptical X 5 min L1  KT tape to bilat plantarfascia, gastroc-soleus and also left tibialis anterior      PATIENT EDUCATION: Education details: HEP, PT plan of care, selfcare Person educated: Patient Education method: Explanation, Demonstration, Verbal cues, and Handouts Education comprehension: verbalized understanding, further education recommended   HOME EXERCISE PROGRAM: Access Code: [email or web address] URL:  [email or web address] Date: 10/13/2023 Prepared by: Redell Moose  Exercises - Gastroc Stretch on Wall  - 1-2 x daily - 3 x weekly - 1 sets - 3 reps - 30 hold - Soleus Stretch on Wall  - 1-2 x daily - 6 x weekly - 1 sets - 3 reps - 30 hold - Heel Toe Raises with Counter Support  - 1-2 x daily - 6 x weekly - 2 sets - 10 reps - Standing Tandem Balance with Counter Support  - 1-2 x daily - 6 x weekly - 1 sets - 3 reps - 15  sec hold - Seated Ankle Plantarflexion with Resistance  - 1-2 x daily - 6 x weekly - 2 sets - 10 reps - Seated Ankle Eversion with Resistance  - 1-2 x daily - 6 x weekly - 2 sets - 10 reps - Seated Ankle Inversion with Resistance  - 1-2 x daily - 6 x weekly - 2 sets - 10 reps - Long Sitting Plantar Fascia Stretch with Towel  - 1-2 x daily - 6 x weekly - 2 sets - 10 reps  ASSESSMENT:  CLINICAL IMPRESSION: She was limited by pain and fatigue today so shorter PT session at her request. ROM is improving some and we will continue to work to improve strength and function slowly and gently as to not aggravate things.   OBJECTIVE IMPAIRMENTS: decreased activity tolerance for ADL's, difficulty walking, decreased balance, decreased endurance, decreased mobility, decreased ROM, decreased strength, impaired flexibility, impaired LE use, and pain.  ACTIVITY LIMITATIONS: standing, walking, cleaning, community activity, driving,  PERSONAL FACTORS: see above PMH  also affecting patient's functional outcome.  REHAB POTENTIAL: Fair    CLINICAL DECISION MAKING: Evolving/moderate complexity  EVALUATION COMPLEXITY: Moderate    GOALS: Short term PT Goals Target date: 11/10/2023   Pt will be I and compliant with HEP. Baseline:  Goal status: New Pt will decrease pain by 25% overall Baseline:8 Goal status: New  Long term PT goals Target date:11/24/2023  Pt will improve left ankle  DF ROM to [facility] >5 deg to improve functional mobility Baseline: Goal status: New Pt will  improve  strength to at least 4+/5 MMT to improve functional strength Baseline: Goal status: New Pt will improve Patient specific functional scale (PSFS) to at least 5/10 to show improved function level Baseline:2/10 Goal status: New Pt will reduce pain to overall less than 4/10 with usual activity and work activity. Baseline:8/10 Goal status: New Pt will be able to ambulate community distances at least 500 ft without complaints Baseline: Goal status: New  PLAN: PT FREQUENCY: 1-2 times per week   PT DURATION: 6-8 weeks  PLANNED INTERVENTIONS (unless contraindicated): 97110-Therapeutic exercises, 97530- Therapeutic activity, [id number]- Neuromuscular re-education, 97535- Self Care, 02859- Manual therapy, [id number]- Gait training, H9716- Electrical stimulation (unattended), 97035- Ultrasound, 02966- Ionotophoresis 4mg /ml Dexamethasone , and Taping  PLAN FOR NEXT SESSION: review HEP, needs ROM, strength as tolerated, modalaties PRN.  KT tape if desire.   NEXT MD VISIT: not scheduled at eval  Redell JONELLE Moose, PT,DPT 10/24/2023, 11:29 AM

## 2023-10-24 ENCOUNTER — Encounter: Payer: Self-pay | Admitting: Physical Therapy

## 2023-10-30 ENCOUNTER — Encounter: Admitting: Physical Therapy

## 2023-10-31 ENCOUNTER — Other Ambulatory Visit: Payer: Self-pay | Admitting: Medical Genetics

## 2023-11-04 NOTE — Telephone Encounter (Signed)
 Pain Management declined the referral and closed it out.

## 2023-11-14 ENCOUNTER — Other Ambulatory Visit (HOSPITAL_COMMUNITY)
Admission: RE | Admit: 2023-11-14 | Discharge: 2023-11-14 | Disposition: A | Discharge status: Home / Self Care | Payer: Self-pay | Source: Ambulatory Visit | Attending: Oncology | Admitting: Oncology

## 2023-11-19 NOTE — Telephone Encounter (Signed)
 Thanks, Nat!   Marty Shaggy, MD [facility] 

## 2023-11-24 LAB — GENECONNECT MOLECULAR SCREEN: Genetic Analysis Overall Interpretation: NEGATIVE

## 2023-11-26 ENCOUNTER — Ambulatory Visit: Attending: Podiatry | Admitting: Physical Therapy

## 2023-11-26 ENCOUNTER — Encounter: Payer: Self-pay | Admitting: Physical Therapy

## 2023-11-26 DIAGNOSIS — M25572 Pain in left ankle and joints of left foot: Secondary | ICD-10-CM | POA: Diagnosis present

## 2023-11-26 DIAGNOSIS — M25571 Pain in right ankle and joints of right foot: Secondary | ICD-10-CM | POA: Diagnosis present

## 2023-11-26 DIAGNOSIS — M6281 Muscle weakness (generalized): Secondary | ICD-10-CM | POA: Diagnosis present

## 2023-11-26 DIAGNOSIS — R262 Difficulty in walking, not elsewhere classified: Secondary | ICD-10-CM | POA: Diagnosis present

## 2023-11-26 NOTE — Therapy (Signed)
 OUTPATIENT PHYSICAL THERAPY TREATMENT   Patient Name: Claire [name] MRN: [id number] DOB:04/14/1970, 53 y.o., female Today's Date: 11/26/2023  END OF SESSION:  PT End of Session - 11/26/23 1432     Visit Number 4    Number of Visits 6    Date for PT Re-Evaluation 11/24/23    Authorization Type [employer] MCR, no auth needed per appointment notes    PT Start Time 1432    PT Stop Time 1510    PT Time Calculation (min) 38 min    Activity Tolerance Patient tolerated treatment well    Behavior During Therapy WFL for tasks assessed/performed            Past Medical History:  Diagnosis Date   Acid reflux    Allergy    pollen, dust   Anxiety    Arthritis    Asthma    Carpal tunnel syndrome    bilateral   DDD (degenerative disc disease), cervical    Depression    Fibromyalgia    Hypertension    Hypokalemia    Migraine    Palpitations    Panic attacks    PTSD (post-traumatic stress disorder)    Seizures (HCC)    unknown etiology; last seizure was 2 years ago; on meds.   Syncope and collapse    Tennis elbow    Ulcer    Vertigo    Past Surgical History:  Procedure Laterality Date   BACK SURGERY  1993   BIOPSY  10/21/2016   Procedure: BIOPSY;  Surgeon: Shaaron Lamar HERO, MD;  [facility]: [facility];  Service: Endoscopy;;  gastric, esophagus   CARPAL TUNNEL RELEASE Left    2019   CHOLECYSTECTOMY     ESOPHAGEAL DILATION N/A 09/18/2023   Procedure: DILATION, ESOPHAGUS;  Surgeon: Shaaron Lamar HERO, MD;  [facility]: [facility];  Service: Endoscopy;  Laterality: N/A;   ESOPHAGOGASTRODUODENOSCOPY N/A 09/18/2023   Procedure: EGD (ESOPHAGOGASTRODUODENOSCOPY);  Surgeon: Shaaron Lamar HERO, MD;  [facility]: [facility];  Service: Endoscopy;  Laterality: N/A;  1:00 PM, OK RM 1/2   ESOPHAGOGASTRODUODENOSCOPY (EGD) WITH PROPOFOL  N/A 10/21/2016   Procedure: ESOPHAGOGASTRODUODENOSCOPY (EGD) WITH PROPOFOL ;  Surgeon: Shaaron Lamar HERO, MD;  [facility];  Service: Endoscopy;  Laterality:  N/A;  9:30am   GALLBLADDER SURGERY     SHOULDER SURGERY Right    SPINE SURGERY  1993   lumbar disc surgery   Patient Active Problem List   Diagnosis Date Noted   GERD (gastroesophageal reflux disease) 10/15/2023   Duodenal ulcer disease 10/15/2023   Nausea with vomiting 08/27/2023   Abdominal pain, epigastric 08/27/2023   Chronic RLQ pain 08/27/2023   Migraine    Lung nodule 04/12/2022   Severe persistent asthma without complication 04/12/2022   PTSD (post-traumatic stress disorder) 05/10/2019   Eosinophilic esophagitis 12/02/2016   H. pylori infection 12/02/2016   Dysphagia 07/03/2016   Constipation 07/03/2016   Abdominal pain 07/03/2016   Generalized anxiety disorder 06/17/2016   Panic disorder 06/17/2016   Asthma in adult, mild intermittent, uncomplicated 06/11/2016   GERD without esophagitis 06/11/2016   TMJ arthropathy 06/11/2016   Frequent falls 06/11/2016   Vertigo 06/11/2016   History of syncope 06/11/2016   Fibromyalgia 07/28/2014   Seizure-like activity (HCC) 07/29/2012    PCP: Alston Silvio JAYSON, FNP   REFERRING PROVIDER: Verta Royden DASEN, DPM   REFERRING DIAG:  [phone] (ICD-10-CM) - Tear of tendon of left ankle, subsequent encounter  [phone] (ICD-10-CM) - Rupture of plantar  fascia of right foot, initial encounter    Rationale for Evaluation and Treatment:  Rehabiliation  THERAPY DIAG:  Pain in left ankle and joints of left foot  Pain in right ankle and joints of right foot  Muscle weakness (generalized)  Difficulty in walking, not elsewhere classified  ONSET DATE: 5 years of pain   SUBJECTIVE:                                                                                                                                                                                           SUBJECTIVE STATEMENT: Pt states she twisted her ankle a week and a half ago and it turned in. Has been sick. Has a sinus infection. It feels like the bone wants to break. Has  been able to do some of the elastic band exercises in bed.   PERTINENT HISTORY:  See above PMH  PAIN:  NPRS scale:8/10 upon arrival Pain location:plantar part of foot up to her ankle Pain description: burning, ache, feels like it wants to break Aggravating factors: standing, walking,  Relieving factors: hot shower   PRECAUTIONS: ,  None  RED FLAGS: None   WEIGHT BEARING RESTRICTIONS:  No  FALLS:  Has patient fallen in last 6 months? No   OCCUPATION:  disabled  PLOF:  Independent with basic ADLs  PATIENT GOALS:  Help relieve the pain  OBJECTIVE:  Note: Objective measures were completed at Evaluation unless otherwise noted.  DIAGNOSTIC FINDINGS:  08/25/23 MRI Left ankle MPRESSION: Strain/mild intramuscular tear to the proximal quadratus plantae musculature. No planar fasciitis.   Mild focal degenerative change to the medial talar dome.   Mild degenerative change to the second and third tarsometatarsal joints.  MRI right ankle IMPRESSION: Unremarkable exam  PATIENT SURVEYS:  Patient-Specific Activity Scoring Scheme  0 represents "unable to perform." 10 represents "able to perform at prior level. 0 1 2 3 4 5 6 7 8 9  10 (Date and Score)   Activity Eval     1. standing  2    2. walking  2    3.     4.    5.    Score 2/10    Total score = sum of the activity scores/number of activities Minimum detectable change (90%CI) for average score = 2 points Minimum detectable change (90%CI) for single activity score = 3 points   GAIT:  Eval: Distance walked: 25 feet Assistive device utilized: Single point cane Level of assistance: Modified independence Comments: increased foot pronation in left, decreased gait speed, increased pain with walking  Ankle  PALPATION: Pain and tenderness in arch of foot  LOWER EXTREMITY ROM:     Active  Left eval Right eval  Hip flexion    Hip extension    Hip abduction    Hip adduction    Hip internal rotation     Hip external rotation    Knee flexion    Knee extension    Ankle dorsiflexion -2 from neutral 5  Ankle plantarflexion 40 50  Ankle inversion WNL WNL  Ankle eversion 15 20   (Blank rows = not tested)   LOWER EXTREMITY MMT:    MMT Right eval Left eval  Hip flexion    Hip extension    Hip abduction    Hip adduction    Hip internal rotation    Hip external rotation    Knee flexion    Knee extension    Ankle dorsiflexion 5 4  Ankle plantarflexion 5 in NWB 4 in NWB  Ankle inversion 5 4  Ankle eversion 5 4   (Blank rows = not tested)   FUNCTIONAL TESTS:  Eval: Tandem balance test: 4 second average with left foot posterior, 9 second average with right foot posterior.                                                                                                                               TREATMENT DATE:  11/26/23 Therex Seated heel slide x10 Seated plantar flexion blue TB 2x10 Seated towel scrunch 2x10 Seated toe yoga 2x10 Seated toe abd/add 2x10 Seated gastroc stretch x 30 Seated toe ext stretch x 30 Seated attempted arch lift but increased pt's pain Seated ankle inv iso x5 Seated ankle inv yellow TB x10     PATIENT EDUCATION: Education details: HEP, PT plan of care, selfcare Person educated: Patient Education method: Explanation, Demonstration, Verbal cues, and Handouts Education comprehension: verbalized understanding, further education recommended   HOME EXERCISE PROGRAM: Access Code: [email or web address] URL: [email or web address] Date: 10/13/2023 Prepared by: Redell Moose  Exercises - Gastroc Stretch on Wall  - 1-2 x daily - 3 x weekly - 1 sets - 3 reps - 30 hold - Soleus Stretch on Wall  - 1-2 x daily - 6 x weekly - 1 sets - 3 reps - 30 hold - Heel Toe Raises with Counter Support  - 1-2 x daily - 6 x weekly - 2 sets - 10 reps - Standing Tandem Balance with Counter Support  - 1-2 x daily - 6 x weekly - 1 sets - 3 reps - 15 sec hold - Seated  Ankle Plantarflexion with Resistance  - 1-2 x daily - 6 x weekly - 2 sets - 10 reps - Seated Ankle Eversion with Resistance  - 1-2 x daily - 6 x weekly - 2 sets - 10 reps - Seated Ankle Inversion with Resistance  - 1-2 x daily - 6 x weekly - 2 sets - 10 reps - Long Sitting Plantar Fascia Stretch with Towel  - 1-2 x daily - 6  x weekly - 2 sets - 10 reps  ASSESSMENT:  CLINICAL IMPRESSION: Ankle is currently aggravated due to twisting it. No edema but pt reports increased pain and instability. Treatment focused on initiating intrinsic foot strengthening. Reviewed some of her ankle strengthening exercises with good pt tolerance. Attempted arch lifting but this increased pt's pain too much. Weak with toe flexion and extension. Discussed ankle bracing for stability.   OBJECTIVE IMPAIRMENTS: decreased activity tolerance for ADL's, difficulty walking, decreased balance, decreased endurance, decreased mobility, decreased ROM, decreased strength, impaired flexibility, impaired LE use, and pain.  ACTIVITY LIMITATIONS: standing, walking, cleaning, community activity, driving,  PERSONAL FACTORS: see above PMH  also affecting patient's functional outcome.  REHAB POTENTIAL: Fair    CLINICAL DECISION MAKING: Evolving/moderate complexity  EVALUATION COMPLEXITY: Moderate    GOALS: Short term PT Goals Target date: 11/10/2023   Pt will be I and compliant with HEP. Baseline:  Goal status: New Pt will decrease pain by 25% overall Baseline:8 Goal status: New  Long term PT goals Target date:11/24/2023  Pt will improve left ankle  DF ROM to [facility] >5 deg to improve functional mobility Baseline: Goal status: New Pt will improve  strength to at least 4+/5 MMT to improve functional strength Baseline: Goal status: New Pt will improve Patient specific functional scale (PSFS) to at least 5/10 to show improved function level Baseline:2/10 Goal status: New Pt will reduce pain to overall less than 4/10 with  usual activity and work activity. Baseline:8/10 Goal status: New Pt will be able to ambulate community distances at least 500 ft without complaints Baseline: Goal status: New  PLAN: PT FREQUENCY: 1-2 times per week   PT DURATION: 6-8 weeks  PLANNED INTERVENTIONS (unless contraindicated): 97110-Therapeutic exercises, 97530- Therapeutic activity, [id number]- Neuromuscular re-education, 97535- Self Care, 02859- Manual therapy, [id number]- Gait training, H9716- Electrical stimulation (unattended), 97035- Ultrasound, 02966- Ionotophoresis 4mg /ml Dexamethasone , and Taping  PLAN FOR NEXT SESSION: review HEP, needs ROM, strength as tolerated, modalaties PRN.  KT tape if desire.   NEXT MD VISIT: not scheduled at eval  Zamia Tyminski April Ma L Tremell Reimers, PT,DPT 11/26/2023, 2:32 PM

## 2023-12-09 ENCOUNTER — Encounter: Payer: Self-pay | Admitting: Physical Therapy

## 2023-12-09 ENCOUNTER — Ambulatory Visit: Admitting: Physical Therapy

## 2023-12-09 DIAGNOSIS — M6281 Muscle weakness (generalized): Secondary | ICD-10-CM

## 2023-12-09 DIAGNOSIS — R262 Difficulty in walking, not elsewhere classified: Secondary | ICD-10-CM

## 2023-12-09 DIAGNOSIS — M25572 Pain in left ankle and joints of left foot: Secondary | ICD-10-CM

## 2023-12-09 DIAGNOSIS — M25571 Pain in right ankle and joints of right foot: Secondary | ICD-10-CM

## 2023-12-09 NOTE — Therapy (Signed)
 OUTPATIENT PHYSICAL THERAPY TREATMENT   Patient Name: Claire [name] MRN: [id number] DOB:[birth year], 53 y.o., female Today's Date: 12/09/2023  END OF SESSION:  PT End of Session - 12/09/23 1419     Visit Number 5    Number of Visits 6    Date for Recertification  11/24/23    Authorization Type [employer] MCR, no auth needed per appointment notes    PT Start Time 1414    PT Stop Time 1452    PT Time Calculation (min) 38 min    Activity Tolerance Patient tolerated treatment well    Behavior During Therapy WFL for tasks assessed/performed            Past Medical History:  Diagnosis Date   Acid reflux    Allergy    pollen, dust   Anxiety    Arthritis    Asthma    Carpal tunnel syndrome    bilateral   DDD (degenerative disc disease), cervical    Depression    Fibromyalgia    Hypertension    Hypokalemia    Migraine    Palpitations    Panic attacks    PTSD (post-traumatic stress disorder)    Seizures (HCC)    unknown etiology; last seizure was 2 years ago; on meds.   Syncope and collapse    Tennis elbow    Ulcer    Vertigo    Past Surgical History:  Procedure Laterality Date   BACK SURGERY  1993   BIOPSY  10/21/2016   Procedure: BIOPSY;  Surgeon: Shaaron Lamar HERO, MD;  [facility];  Service: Endoscopy;;  gastric, esophagus   CARPAL TUNNEL RELEASE Left    2019   CHOLECYSTECTOMY     ESOPHAGEAL DILATION N/A 09/18/2023   Procedure: DILATION, ESOPHAGUS;  Surgeon: Shaaron Lamar HERO, MD;  [facility];  Service: Endoscopy;  Laterality: N/A;   ESOPHAGOGASTRODUODENOSCOPY N/A 09/18/2023   Procedure: EGD (ESOPHAGOGASTRODUODENOSCOPY);  Surgeon: Shaaron Lamar HERO, MD;  [facility];  Service: Endoscopy;  Laterality: N/A;  1:00 PM, OK RM 1/2   ESOPHAGOGASTRODUODENOSCOPY (EGD) WITH PROPOFOL  N/A 10/21/2016   Procedure: ESOPHAGOGASTRODUODENOSCOPY (EGD) WITH PROPOFOL ;  Surgeon: Shaaron Lamar HERO, MD;  [facility];  Service: Endoscopy;  Laterality:  N/A;  9:30am   GALLBLADDER SURGERY     SHOULDER SURGERY Right    SPINE SURGERY  1993   lumbar disc surgery   Patient Active Problem List   Diagnosis Date Noted   GERD (gastroesophageal reflux disease) 10/15/2023   Duodenal ulcer disease 10/15/2023   Nausea with vomiting 08/27/2023   Abdominal pain, epigastric 08/27/2023   Chronic RLQ pain 08/27/2023   Migraine    Lung nodule 04/12/2022   Severe persistent asthma without complication 04/12/2022   PTSD (post-traumatic stress disorder) 05/10/2019   Eosinophilic esophagitis 12/02/2016   H. pylori infection 12/02/2016   Dysphagia 07/03/2016   Constipation 07/03/2016   Abdominal pain 07/03/2016   Generalized anxiety disorder 06/17/2016   Panic disorder 06/17/2016   Asthma in adult, mild intermittent, uncomplicated 06/11/2016   GERD without esophagitis 06/11/2016   TMJ arthropathy 06/11/2016   Frequent falls 06/11/2016   Vertigo 06/11/2016   History of syncope 06/11/2016   Fibromyalgia 07/28/2014   Seizure-like activity (HCC) 07/29/2012    PCP: Alston Silvio JAYSON, FNP   REFERRING PROVIDER: Verta Royden DASEN, DPM   REFERRING DIAG:  [phone] (ICD-10-CM) - Tear of tendon of left ankle, subsequent encounter  [phone] (ICD-10-CM) - Rupture of plantar  fascia of right foot, initial encounter    Rationale for Evaluation and Treatment:  Rehabiliation  THERAPY DIAG:  Pain in left ankle and joints of left foot  Pain in right ankle and joints of right foot  Muscle weakness (generalized)  Difficulty in walking, not elsewhere classified  ONSET DATE: 5 years of pain   SUBJECTIVE:                                                                                                                                                                                           SUBJECTIVE STATEMENT: Pt states she has been sick and has had a lot of ankle and knee pain. The knee pain is more recent so she will see ortho tomorrow about this.   PERTINENT  HISTORY:  See above PMH  PAIN:  NPRS scale:8/10 upon arrival Pain location:plantar part of foot up to her ankle Pain description: burning, ache, feels like it wants to break Aggravating factors: standing, walking,  Relieving factors: hot shower   PRECAUTIONS: ,  None  RED FLAGS: None   WEIGHT BEARING RESTRICTIONS:  No  FALLS:  Has patient fallen in last 6 months? No   OCCUPATION:  disabled  PLOF:  Independent with basic ADLs  PATIENT GOALS:  Help relieve the pain  OBJECTIVE:  Note: Objective measures were completed at Evaluation unless otherwise noted.  DIAGNOSTIC FINDINGS:  08/25/23 MRI Left ankle MPRESSION: Strain/mild intramuscular tear to the proximal quadratus plantae musculature. No planar fasciitis.   Mild focal degenerative change to the medial talar dome.   Mild degenerative change to the second and third tarsometatarsal joints.  MRI right ankle IMPRESSION: Unremarkable exam  PATIENT SURVEYS:  Patient-Specific Activity Scoring Scheme  0 represents "unable to perform." 10 represents "able to perform at prior level. 0 1 2 3 4 5 6 7 8 9  10 (Date and Score)   Activity Eval     1. standing  2    2. walking  2    3.     4.    5.    Score 2/10    Total score = sum of the activity scores/number of activities Minimum detectable change (90%CI) for average score = 2 points Minimum detectable change (90%CI) for single activity score = 3 points   GAIT:  Eval: Distance walked: 25 feet Assistive device utilized: Single point cane Level of assistance: Modified independence Comments: increased foot pronation in left, decreased gait speed, increased pain with walking  Ankle  PALPATION: Pain and tenderness in arch of foot  LOWER EXTREMITY ROM:     Active  Left eval Right  Eval  Left 12/09/23  Hip flexion     Hip extension     Hip abduction     Hip adduction     Hip internal rotation     Hip external rotation     Knee flexion     Knee  extension     Ankle dorsiflexion -2 from neutral 5 5  Ankle plantarflexion 40 50 50  Ankle inversion WNL WNL WNL  Ankle eversion 15 20 25    (Blank rows = not tested)   LOWER EXTREMITY MMT:    MMT Right eval Left eval Left 12/09/23  Hip flexion     Hip extension     Hip abduction     Hip adduction     Hip internal rotation     Hip external rotation     Knee flexion     Knee extension     Ankle dorsiflexion 5 4 4+  Ankle plantarflexion 5 in NWB 4 in NWB 5 in NWB  Ankle inversion 5 4 4+  Ankle eversion 5 4 4+   (Blank rows = not tested)   FUNCTIONAL TESTS:  Eval: Tandem balance test: 4 second average with left foot posterior, 9 second average with right foot posterior.                                                                                                                               TREATMENT DATE:  12/09/23 Therex Nu step seated L5 UE/LE X 10 min seat #6 Seated plantar flexion yellow 2x10 Seated toe curls and toe extensions X 10 each Seated gastroc stretch 2 x 30 Seated soleus stretch2  x 30 Seated knee flexion stretch AAROM 5 sec X 10 Seated SLR X 10  Standing alternating hamstring curls X 10 bilat  11/26/23 Therex Seated heel slide x10 Seated plantar flexion blue TB 2x10 Seated towel scrunch 2x10 Seated toe yoga 2x10 Seated toe abd/add 2x10 Seated gastroc stretch x 30 Seated toe ext stretch x 30 Seated attempted arch lift but increased pt's pain Seated ankle inv iso x5 Seated ankle inv yellow TB x10   PATIENT EDUCATION: Education details: HEP, PT plan of care, selfcare Person educated: Patient Education method: Explanation, Demonstration, Verbal cues, and Handouts Education comprehension: verbalized understanding, further education recommended   HOME EXERCISE PROGRAM: Access Code: [email or web address] URL: [email or web address] Date: 12/09/2023 Prepared by: Redell Moose  Exercises - Gastroc Stretch on Wall  - 1-2 x daily - 3 x  weekly - 1 sets - 3 reps - 30 hold - Soleus Stretch on Wall  - 1-2 x daily - 6 x weekly - 1 sets - 3 reps - 30 hold - Heel Toe Raises with Counter Support  - 1-2 x daily - 6 x weekly - 2 sets - 10 reps - Standing Tandem Balance with Counter Support  - 1-2 x daily - 6 x weekly - 1 sets - 3 reps - 15 sec hold - Seated Ankle  Plantarflexion with Resistance  - 1-2 x daily - 6 x weekly - 2 sets - 10 reps - Seated Ankle Eversion with Resistance  - 1-2 x daily - 6 x weekly - 2 sets - 10 reps - Long Sitting Plantar Fascia Stretch with Towel  - 1-2 x daily - 6 x weekly - 2 sets - 10 reps - Seated Ankle Inversion with Resistance  - 1-2 x daily - 6 x weekly - 2 sets - 10 reps - Seated Toe Towel Scrunches  - 1 x daily - 7 x weekly - 2 sets - 10 reps - Toe Yoga - Alternating Great Toe and Lesser Toe Extension  - 1 x daily - 7 x weekly - 2 sets - 10 reps - Toe Spreading  - 1 x daily - 7 x weekly - 2 sets - 10 reps - seated knee flexion stretch with knee extension strengthening  - 2 x daily - 6 x weekly - 1 sets - 10 reps - 5 sec hold - Seated Active Straight-Leg Raise  - 2 x daily - 6 x weekly - 1 sets - 10 reps - Standing Knee Flexion  - 2 x daily - 6 x weekly - 1 sets - 10 reps  ASSESSMENT:  CLINICAL IMPRESSION: Her ankle strength and ROM are now pretty good overall but she still has a lot of pain overall, feels it may be fibromyalgia. Has new complaints of Left knee pain as well and will see ortho about this tomorrow. I did give her knee exercises to add today as well and we will check back with her Thursday to see how they are going.   OBJECTIVE IMPAIRMENTS: decreased activity tolerance for ADL's, difficulty walking, decreased balance, decreased endurance, decreased mobility, decreased ROM, decreased strength, impaired flexibility, impaired LE use, and pain.  ACTIVITY LIMITATIONS: standing, walking, cleaning, community activity, driving,  PERSONAL FACTORS: see above PMH  also affecting patient's  functional outcome.  REHAB POTENTIAL: Fair    CLINICAL DECISION MAKING: Evolving/moderate complexity  EVALUATION COMPLEXITY: Moderate    GOALS: Short term PT Goals Target date: 11/10/2023   Pt will be I and compliant with HEP. Baseline:  Goal status: MET 11/10/23 Pt will decrease pain by 25% overall Baseline:8 Goal status: Not met 12/09/23  Long term PT goals Target date:11/24/2023  Pt will improve left ankle  DF ROM to [facility] >5 deg to improve functional mobility Baseline: Goal status: MET 12/09/23 Pt will improve  strength to at least 4+/5 MMT to improve functional strength Baseline: Goal status: MET 12/09/23 Pt will improve Patient specific functional scale (PSFS) to at least 5/10 to show improved function level Baseline:2/10 Goal status: ongoing 12/09/23 Pt will reduce pain to overall less than 4/10 with usual activity and work activity. Baseline:8/10 Goal status: ongoing 12/09/23 Pt will be able to ambulate community distances at least 500 ft without complaints Baseline: Goal status: ongoing 12/09/23  PLAN: PT FREQUENCY: 1-2 times per week   PT DURATION: 6-8 weeks  PLANNED INTERVENTIONS (unless contraindicated): 97110-Therapeutic exercises, 97530- Therapeutic activity, [id number]- Neuromuscular re-education, 97535- Self Care, 02859- Manual therapy, [id number]- Gait training, [id number]- Electrical stimulation (unattended), 97035- Ultrasound, 02966- Ionotophoresis 4mg /ml Dexamethasone , and Taping  PLAN FOR NEXT SESSION: review new HEP for knee exercises. What did MD say. Plan to transition to independent program.   Redell JONELLE Moose, PT,DPT 12/09/2023, 2:53 PM

## 2023-12-10 ENCOUNTER — Encounter: Payer: Self-pay | Admitting: Gastroenterology

## 2023-12-10 ENCOUNTER — Encounter: Payer: Self-pay | Admitting: Orthopedic Surgery

## 2023-12-10 ENCOUNTER — Ambulatory Visit: Admitting: Gastroenterology

## 2023-12-10 ENCOUNTER — Other Ambulatory Visit (INDEPENDENT_AMBULATORY_CARE_PROVIDER_SITE_OTHER)

## 2023-12-10 ENCOUNTER — Ambulatory Visit (INDEPENDENT_AMBULATORY_CARE_PROVIDER_SITE_OTHER): Admitting: Orthopedic Surgery

## 2023-12-10 VITALS — BP 127/82 | HR 70 | Temp 98.6°F | Ht 63.0 in | Wt 133.0 lb

## 2023-12-10 VITALS — BP 115/79 | Ht 63.0 in | Wt 139.0 lb

## 2023-12-10 DIAGNOSIS — M25562 Pain in left knee: Secondary | ICD-10-CM | POA: Diagnosis not present

## 2023-12-10 DIAGNOSIS — I10 Essential (primary) hypertension: Secondary | ICD-10-CM | POA: Insufficient documentation

## 2023-12-10 DIAGNOSIS — K200 Eosinophilic esophagitis: Secondary | ICD-10-CM | POA: Diagnosis not present

## 2023-12-10 DIAGNOSIS — R634 Abnormal weight loss: Secondary | ICD-10-CM | POA: Insufficient documentation

## 2023-12-10 DIAGNOSIS — K219 Gastro-esophageal reflux disease without esophagitis: Secondary | ICD-10-CM

## 2023-12-10 DIAGNOSIS — K5900 Constipation, unspecified: Secondary | ICD-10-CM

## 2023-12-10 DIAGNOSIS — R112 Nausea with vomiting, unspecified: Secondary | ICD-10-CM

## 2023-12-10 DIAGNOSIS — M1120 Other chondrocalcinosis, unspecified site: Secondary | ICD-10-CM

## 2023-12-10 DIAGNOSIS — J4541 Moderate persistent asthma with (acute) exacerbation: Secondary | ICD-10-CM | POA: Insufficient documentation

## 2023-12-10 DIAGNOSIS — K269 Duodenal ulcer, unspecified as acute or chronic, without hemorrhage or perforation: Secondary | ICD-10-CM | POA: Diagnosis not present

## 2023-12-10 DIAGNOSIS — K5909 Other constipation: Secondary | ICD-10-CM

## 2023-12-10 MED ORDER — INDOMETHACIN 25 MG PO CAPS: 25.0000 mg | ORAL_CAPSULE | Freq: Three times a day (TID) | ORAL | 0 refills | Status: DC

## 2023-12-10 MED ORDER — MOTEGRITY 2 MG PO TABS: 2.0000 mg | ORAL_TABLET | Freq: Every day | ORAL | 5 refills | Status: AC

## 2023-12-10 NOTE — Progress Notes (Signed)
 GI Office Note    Referring Provider: Alston Silvio BROCKS, FNP Primary Care Physician:  Bucio, Elsa C, FNP  Primary Gastroenterologist: Ozell Hollingshead, MD   Chief Complaint   Chief Complaint  Patient presents with   Follow-up    Has log of what has been going on since last visit.    History of Present Illness   Claire [name] is a 53 y.o. female presenting today for follow-up.  Last seen July 2025.  She has a history of GERD/reflux esophagitis, EOE, IBS-constipation, duodenal ulcer.  Previous H. pylori with confirmed eradication.She has a history of duodenal ulcer/multiple gastric erosions on last endoscopy.  Omeprazole  was increased to 40 mg twice daily.  Advised to avoid all NSAIDs, had been on ibuprofen  and meloxicam .   At last office visit she was struggling with being able to keep her medications down for several months.  She was eating small portions of food, taking up to 5 hours to eat a serving of food.  Attempting to add back her nighttime medications.  We switched her Linzess  to Trulance  3 mg daily.  We added sucralfate  1 g 4 times daily for 2 to 3 weeks.  Advise no NSAIDs/aspirin powders.  Discussed the use of AI scribe software for clinical note transcription with the patient, who gave verbal consent to proceed.   She experiences ongoing constipation and abdominal pain, with infrequent bowel movements that are sometimes 'triangle shaped' and painful. The abdominal pain resembles menstrual cramps, intensifying before bowel movements and severe enough to disrupt sleep.  She has been taking Trulance  for constipation without effectiveness and continues to use Linzess  despite its removal from her medication list. Miralax provides occasional relief but requires multiple doses which she does not prefer. Childhood experiences of severe constipation required hospitalization and suppositories.  A colonoscopy attempt in February 2023 was poorly prepped, and a successful follow-up has not  occurred.   Her gastrointestinal history includes a large ulcer found during a July 2025 endoscopy. She experiences nausea and occasional vomiting, particularly with medications like Savella . Kratom tablets are used for pain relief, providing a 'pain pill feeling' and alleviating cramps. Denies heartburn, dysphagia. Still has not been able to add back her night time medications due to nausea. No heartburn. No dysphagia.   Significant weight loss from 150-160 lbs to 133 lbs is noted, with decreased appetite and early satiety. She drinks a lot of water and occasionally soda but struggles to maintain a diet rich in fruits and vegetables due to feeling full quickly.      Wt Readings from Last 10 Encounters:  12/10/23 133 lb (60.3 kg)  12/10/23 139 lb (63 kg)  10/15/23 139 lb 3.2 oz (63.1 kg)  09/18/23 141 lb (64 kg)  09/03/23 144 lb (65.3 kg)  08/27/23 143 lb 12.8 oz (65.2 kg)  06/25/23 152 lb 11.2 oz (69.3 kg)  03/05/23 154 lb 6 oz (70 kg)  02/11/23 156 lb 12.8 oz (71.1 kg)  01/16/23 159 lb 12.8 oz (72.5 kg)   Prior Data    Reviewed records from [facility] ED visit dated October 09, 2023.  CT abdomen and pelvis with IV contrast showed status postcholecystectomy with suspected compensatory enlargement of the CBD at 12 mm which is stable, similar to prior CT in June 2023.  Patient's known duodenal ulcer is not visualized on current CT exam.  Appendix is normal.   Urinalysis with 30 protein, 100 glucose, 15 ketones, small bilirubin, trace blood, moderate  mucus.   Kurt blood cell count 13.1, hemoglobin 15.8, MCV 87.3, platelets 133, sodium 142, potassium 3.4, BUN 15, creatinine 0.74, albumin 4.4, total bilirubin 0.7, AST 13, ALT 33, alk phos 104, lipase 19.   EGD 09/18/2023: -normal esophagus s/p dilation -gastric erosions, large bulbar ulcer (1.5 cm). S/p gastric and duodenal biopsies. Antral biopsies with hyperemia and neg for hpylori. Esophageal biopsies c/w reflux esophagitis.   Her  colonoscopy 04/2021 was incomplete due to poor bowel prep. Patient reports that she had vomiting with one of the OTC bowel prep medications. She cannot remember name. Tolerated miralax.    EGD on file August 2018 with gray paper esophageal mucosa in longitudinal for is without tumor or Barrett's esophagus, antral erosions but no ulcers, 2 superficial tears in the midesophagus corresponding to an area of critical narrowing.  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  Stomach biopsies positive for H. pylori treated with Pylera and esophageal biopsy with squamous mucosa with marked increased epithelial eosinophils.  She was unable to complete H. pylori breath testing for eradication due to inability to stop her PPI at that time.    In October 2021 she had allergy testing with high IgE to cat dander, tree pollen, weed pollen, moderate IgE to grass pollen, low IgE to dog dander.  Very positive IgE levels to wheat, barley, rye, oat recommended to follow gluten free diet.  September 2022, alpha gal testing negative.  Stinging insect panel positive for the entire panel with highest allergy to paper wasp.   Colonoscopy 04/2021: Dr. Duwaine Jumper Impression:            - Preparation of the colon was poor. Time spent to irrigate the colon                        -A digital rectal exam was performed                       - The examination was otherwise normal.                       - No specimens collected.                       - Repeat colonoscopy in 5 years for screening purposes.     Medications   Current Outpatient Medications  Medication Sig Dispense Refill   DUPIXENT  300 MG/2ML SOAJ every 14 (fourteen) days.     lamoTRIgine (LAMICTAL) 100 MG tablet Take 100 mg by mouth 2 (two) times daily.     omeprazole  (PRILOSEC) 40 MG capsule Take 1 capsule (40 mg total) by mouth 2 (two) times daily before a meal. 180 capsule 0   Plecanatide  (TRULANCE ) 3 MG TABS Take 1 tablet (3 mg total) by mouth daily. 30 tablet 5           SYMBICORT  160-4.5 MCG/ACT inhaler Inhale 2 puffs into the lungs 2 (two) times daily. 1 each 5          No current facility-administered medications for this visit.    Allergies   Allergies as of 12/10/2023 - Review Complete 12/10/2023  Allergen Reaction Noted   Baclofen Shortness Of Breath 10/10/2016   Duloxetine Hives 07/29/2012   Penicillins Hives 07/29/2012   Sulfa antibiotics Hives, Nausea And Vomiting, and Other (See Comments) 07/29/2012   Benadryl  [diphenhydramine ]  09/26/2021   Gluten meal  06/07/2019   Penicillin v  potassium Other (See Comments) 05/01/2021   Duloxetine hcl Hives and Other (See Comments) 07/29/2012   Loratadine Other (See Comments) 07/28/2014   Ultram  [tramadol ] Other (See Comments) 09/12/2014     Past Medical History   Past Medical History:  Diagnosis Date   Acid reflux    Allergy    pollen, dust   Anxiety    Arthritis    Asthma    Carpal tunnel syndrome    bilateral   DDD (degenerative disc disease), cervical    Depression    Fibromyalgia    Hypertension    Hypokalemia    Migraine    Palpitations    Panic attacks    PTSD (post-traumatic stress disorder)    Seizures (HCC)    unknown etiology; last seizure was 2 years ago; on meds.   Syncope and collapse    Tennis elbow    Ulcer    Vertigo     Past Surgical History   Past Surgical History:  Procedure Laterality Date   BACK SURGERY  1993   BIOPSY  10/21/2016   Procedure: BIOPSY;  Surgeon: Shaaron Lamar HERO, MD;  [facility];  Service: Endoscopy;;  gastric, esophagus   CARPAL TUNNEL RELEASE Left    2019   CHOLECYSTECTOMY     ESOPHAGEAL DILATION N/A 09/18/2023   Procedure: DILATION, ESOPHAGUS;  Surgeon: Shaaron Lamar HERO, MD;  [facility];  Service: Endoscopy;  Laterality: N/A;   ESOPHAGOGASTRODUODENOSCOPY N/A 09/18/2023   Procedure: EGD (ESOPHAGOGASTRODUODENOSCOPY);  Surgeon: Shaaron Lamar HERO, MD;  [facility];  Service: Endoscopy;  Laterality: N/A;  1:00  PM, OK RM 1/2   ESOPHAGOGASTRODUODENOSCOPY (EGD) WITH PROPOFOL  N/A 10/21/2016   Procedure: ESOPHAGOGASTRODUODENOSCOPY (EGD) WITH PROPOFOL ;  Surgeon: Shaaron Lamar HERO, MD;  [facility];  Service: Endoscopy;  Laterality: N/A;  9:30am   GALLBLADDER SURGERY     SHOULDER SURGERY Right    SPINE SURGERY  1993   lumbar disc surgery    Past Family History   Family History  Problem Relation Age of Onset   COPD Mother    Bronchitis Mother    Arthritis Mother    Depression Mother    Heart disease Mother    Hypertension Mother    Alcohol abuse Father    Early death Father        GSW   Migraines Sister    Colon cancer Maternal Grandmother    PKU Son    Sleep apnea Neg Hx     Past Social History   Social History   Socioeconomic History   Marital status: Single    Spouse name: Not on file   Number of children: 1   Years of education: HS   Highest education level: Not on file  Occupational History   Occupation: disability    Comment: unemployed  Tobacco Use   Smoking status: Never   Smokeless tobacco: Never  Vaping Use   Vaping status: Never Used  Substance and Sexual Activity   Alcohol use: Not Currently    Comment: drink occasionally   Drug use: No   Sexual activity: Not Currently    Partners: Female    Birth control/protection: None  Other Topics Concern   Not on file  Social History Narrative   Patient is left handed.   Patient drinks very little caffeine.   Lives alone   Social Drivers of Health   Financial Resource Strain: Not on file  Food Insecurity: Not on file  Transportation Needs: Not on file  Physical Activity: Not on file  Stress: Not on file  Social Connections: Not on file  Intimate Partner Violence: Not At Risk (05/08/2021)   Received from [facility]   Humiliation, Afraid, Rape, and Kick questionnaire    Within the last year, have you been afraid of your partner or ex-partner?: No    Within the last year, have you been humiliated  or emotionally abused in other ways by your partner or ex-partner?: No    Within the last year, have you been kicked, hit, slapped, or otherwise physically hurt by your partner or ex-partner?: No    Within the last year, have you been raped or forced to have any kind of sexual activity by your partner or ex-partner?: No    Review of Systems   General: Negative for   fever, chills, fatigue, weakness.see hpi ENT: Negative for hoarseness, difficulty swallowing , nasal congestion. CV: Negative for chest pain, angina, palpitations, dyspnea on exertion, peripheral edema.  Respiratory: Negative for dyspnea at rest, dyspnea on exertion, cough, sputum, wheezing.  GI: See history of present illness. GU:  Negative for dysuria, hematuria, urinary incontinence, urinary frequency, nocturnal urination.  Endo: Negative for unusual weight change.     Physical Exam   BP 127/82 (BP Location: Right Arm, Patient Position: Sitting, Cuff Size: Normal)   Pulse 70   Temp 98.6 F (37 C) (Oral)   Ht 5' 3 (1.6 m)   Wt 133 lb (60.3 kg)   LMP  (LMP Unknown)   SpO2 97%   BMI 23.56 kg/m    General: Well-nourished, well-developed in no acute distress.  Eyes: No icterus. Mouth: Oropharyngeal mucosa moist and pink   Lungs: Clear to auscultation bilaterally.  Heart: Regular rate and rhythm, no murmurs rubs or gallops.  Abdomen: Bowel sounds are normal,   nondistended, no hepatosplenomegaly or masses,  no abdominal bruits or hernia , no rebound or guarding. Mild epigastric tenderness Rectal: not performed Extremities: No lower extremity edema. No clubbing or deformities. Neuro: Alert and oriented x 4   Skin: Warm and dry, no jaundice.   Psych: Alert and cooperative, normal mood and affect.  Labs   See above  Imaging Studies   DG Knee AP/LAT W/[facility] Left Result Date: 12/10/2023 Left knee x-ray Acute pain no trauma Alignment is normal Joint space narrowing minimal Chondrocalcinosis is noted on the medial  and lateral side of the knee joint Patellar alignment is normal Impression Chondrocalcinosis    Assessment/Plan:    Chronic constipation with abdominal pain Chronic constipation with abdominal pain, inadequate response to Trulance  and Linzess  (she was inadvertently taking both at the same time). Will stop both at this time. Suspect symptoms exacerbated by use of Kratom.  - Start Motegrity  2mg  daily for constipation management. - Discussed Damon if Motegrity  is ineffective. -advised to stop Kratom due to risk of hepatic toxicity, seizures, and substance abuse disorders.   Nausea and vomiting, early satiety, unintentional weight loss Intermittent nausea and vomiting, symptoms ongoing. Substantial weight loss. ?due to ulcer disease. ?related to Kratom, not clear if symptoms were present before she started this supplement -continue omeprazole  40mg  BID before a meal -consider EGD to re-evaluate ulcer disease due to ongoing N/V, weight loss. -advised to stop Kratom due to risk of hepatic toxicity, seizures, and substance abuse disorders  GERD/EOE/Duodenal ulcer  -continue omeprazole  40mg  BID before a meal -avoid food allergens -consider EGD to re-evaluate ulcer disease due to ongoing N/V.  Claire [name], MHS, PA-C [facility]

## 2023-12-10 NOTE — Progress Notes (Signed)
  Intake history:  Chief Complaint  Patient presents with   Knee Pain    Left      BP 115/79 Comment: 10/15/23  Ht 5' 3 (1.6 m)   Wt 139 lb (63 kg)   BMI 24.62 kg/m  Body mass index is 24.62 kg/m.  Pharmacy? _____[facility]_______________  WHAT ARE WE SEEING YOU FOR TODAY?   left knee(s)  How long has this bothered you? (DOI?DOS?WS?)  on Sept 14th woke up with pain swelling couldn't WB no injury  Was there an injury? No  Anticoag.  No  Diabetes No  Heart disease No  Hypertension Yes  SMOKING HX No  Kidney disease No  Any ALLERGIES ___________ Allergies  Allergen Reactions   Baclofen Shortness Of Breath    Swollen ankles   Duloxetine Hives    Other reaction(s): Other (See Comments) Hives, forgot who I was   Penicillins Hives    Has patient had a PCN reaction causing immediate rash, facial/tongue/throat swelling, SOB or lightheadedness with hypotension: Unknown Has patient had a PCN reaction causing severe rash involving mucus membranes or skin necrosis: Yes Has patient had a PCN reaction that required hospitalization: Unknown Has patient had a PCN reaction occurring within the last 10 years: Unknown If all of the above answers are NO, then may proceed with Cephalosporin use. Other reaction(s): Other (See Comments) Has patient had a PCN reaction causing immediate rash, facial/tongue/throat swelling, SOB or lightheadedness with hypotension: Unknown Has patient had a PCN reaction causing severe rash involving mucus membranes or skin necrosis: Yes Has patient had a PCN reaction that required hospitalization: Unknown Has patient had a PCN reaction occurring within the last 10 years: Unknown If all of the above answers are NO, then may proceed with Cephalosporin use.   Sulfa Antibiotics Hives, Nausea And Vomiting and Other (See Comments)    Other Reaction(s): Unknown   Benadryl  François.Funk ]     Started throwing up   Gluten Meal     Penicillin V Potassium Other (See Comments)    Other Reaction(s): Unknown   Duloxetine Hcl Hives and Other (See Comments)    Hives, forgot who I was  Other Reaction(s): Unknown   Loratadine Other (See Comments)    shaking  Other Reaction(s): Unknown   Ultram  [Tramadol ] Other (See Comments)    Dizzy   ___________________________________   Treatment:  Have you taken:  Tylenol  No  Advil  No  Had PT Yes going to PT for foot and they showed her some knee exercises   Had injection No  Other  _________________________

## 2023-12-10 NOTE — Patient Instructions (Addendum)
 Start motegrity  2mg  daily for constipation.   STOP Linzess  and Trulance .   Continue omeprazole  40mg  twice daily before breakfast and supper.   You may require another upper endoscopy to look for ulcer healing since you are still having so much nausea and vomiting. I will let you know what Dr. Shaaron advises.   Continue to document your bowel movements.

## 2023-12-10 NOTE — Progress Notes (Signed)
    Chief Complaint  Patient presents with   Knee Pain    Left     53 year old female currently undergoing treatment for issues with her left foot and ankle under the direction of podiatry presents to me with acute onset of left knee pain in the peripatellar region with no history of trauma.  The patient says she woke up and she could not weight-bear  Patient complains of pain at the inferior pole of her patella patellar tendon and medial femoral condyle and medial parapatellar region  She denies any trauma  She has a cane that she is walking with  She has a history of fibromyalgia History of back and neck surgery  Problem list, medical hx, medications and allergies reviewed    BP 115/79 Comment: 10/15/23  Ht 5' 3 (1.6 m)   Wt 139 lb (63 kg)   BMI 24.62 kg/m   Small frame normal development grooming and hygiene she does have a history of respiratory disease taking multiple inhalers and medications for breathing  Tenderness at the inferior pole patella patella tendon she has intact extensor mechanism with painful extension but no evidence of weakness or rupture she has tenderness over the medial retinaculum and medial femoral condyle  Knee is very stable.  There is no joint line tenderness or knee effusion  Images were ordered DG Knee AP/LAT W/Sunrise Left Result Date: 12/10/2023 Left knee x-ray Acute pain no trauma Alignment is normal Joint space narrowing minimal Chondrocalcinosis is noted on the medial and lateral side of the knee joint Patellar alignment is normal Impression Chondrocalcinosis     Assessment and plan  Encounter Diagnoses  Name Primary?   Acute pain of left knee Yes   Chondrocalcinosis     Unclear diagnosis  The patient is not taking any Tylenol  or Advil   Differential diagnosis includes bone infarct may be from prior steroid use, chondrocalcinosis  Symptomatic treatment  Reevaluate in 3 weeks  Meds ordered this encounter  Medications    indomethacin  (INDOCIN ) 25 MG capsule    Sig: Take 1 capsule (25 mg total) by mouth 3 (three) times daily with meals for 14 days.    Dispense:  42 capsule    Refill:  0    She will wear an economy hinged brace as well to support the knee

## 2023-12-11 ENCOUNTER — Ambulatory Visit: Admitting: Physical Therapy

## 2023-12-11 ENCOUNTER — Encounter: Payer: Self-pay | Admitting: Physical Therapy

## 2023-12-11 DIAGNOSIS — M25572 Pain in left ankle and joints of left foot: Secondary | ICD-10-CM | POA: Diagnosis not present

## 2023-12-11 DIAGNOSIS — M6281 Muscle weakness (generalized): Secondary | ICD-10-CM

## 2023-12-11 DIAGNOSIS — R262 Difficulty in walking, not elsewhere classified: Secondary | ICD-10-CM

## 2023-12-11 DIAGNOSIS — M25571 Pain in right ankle and joints of right foot: Secondary | ICD-10-CM

## 2023-12-11 NOTE — Therapy (Signed)
 OUTPATIENT PHYSICAL THERAPY TREATMENT/Discharge PHYSICAL THERAPY DISCHARGE SUMMARY  Visits from Start of Care: 6  Current functional level related to goals / functional outcomes: See below   Remaining deficits: See below   Education / Equipment: HEP  Plan:  Patient goals were some met. Patient is being discharged as she feels ready to discharge to independen t program and will continue with HEP.      Patient Name: Claire [name] MRN: [id number] DOB:[birth year], 53 y.o., female Today's Date: 12/11/2023  END OF SESSION:  PT End of Session - 12/11/23 1429     Visit Number 6    Number of Visits 6    Date for Recertification  11/24/23    Authorization Type [employer] MCR, no auth needed per appointment notes    PT Start Time 1415    PT Stop Time 1453    PT Time Calculation (min) 38 min    Activity Tolerance Patient tolerated treatment well    Behavior During Therapy WFL for tasks assessed/performed            Past Medical History:  Diagnosis Date   Acid reflux    Allergy    pollen, dust   Anxiety    Arthritis    Asthma    Carpal tunnel syndrome    bilateral   DDD (degenerative disc disease), cervical    Depression    Fibromyalgia    Hypertension    Hypokalemia    Migraine    Palpitations    Panic attacks    PTSD (post-traumatic stress disorder)    Seizures (HCC)    unknown etiology; last seizure was 2 years ago; on meds.   Syncope and collapse    Tennis elbow    Ulcer    Vertigo    Past Surgical History:  Procedure Laterality Date   BACK SURGERY  1993   BIOPSY  10/21/2016   Procedure: BIOPSY;  Surgeon: Shaaron Lamar HERO, MD;  Location: [facility];  Service: Endoscopy;;  gastric, esophagus   CARPAL TUNNEL RELEASE Left    2019   CHOLECYSTECTOMY     ESOPHAGEAL DILATION N/A 09/18/2023   Procedure: DILATION, ESOPHAGUS;  Surgeon: Shaaron Lamar HERO, MD;  Location: [facility];  Service: Endoscopy;  Laterality: N/A;   ESOPHAGOGASTRODUODENOSCOPY N/A 09/18/2023    Procedure: EGD (ESOPHAGOGASTRODUODENOSCOPY);  Surgeon: Shaaron Lamar HERO, MD;  Location: [facility];  Service: Endoscopy;  Laterality: N/A;  1:00 PM, OK RM 1/2   ESOPHAGOGASTRODUODENOSCOPY (EGD) WITH PROPOFOL  N/A 10/21/2016   Procedure: ESOPHAGOGASTRODUODENOSCOPY (EGD) WITH PROPOFOL ;  Surgeon: Shaaron Lamar HERO, MD;  Location: [facility];  Service: Endoscopy;  Laterality: N/A;  9:30am   GALLBLADDER SURGERY     SHOULDER SURGERY Right    SPINE SURGERY  1993   lumbar disc surgery   Patient Active Problem List   Diagnosis Date Noted   Essential hypertension 12/10/2023   Moderate persistent asthma with exacerbation 12/10/2023   Weight loss, unintentional 12/10/2023   GERD (gastroesophageal reflux disease) 10/15/2023   Duodenal ulcer disease 10/15/2023   Nausea with vomiting 08/27/2023   Abdominal pain, epigastric 08/27/2023   Chronic RLQ pain 08/27/2023   Migraine    Lung nodule 04/12/2022   Severe persistent asthma without complication 04/12/2022   PTSD (post-traumatic stress disorder) 05/10/2019   Eosinophilic esophagitis 12/02/2016   H. 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   Frequent falls 06/11/2016   Vertigo 06/11/2016   History of syncope 06/11/2016   Fibromyalgia 07/28/2014   Seizure-like activity (HCC) 07/29/2012    PCP: Alston Silvio BROCKS, FNP   REFERRING PROVIDER: Verta Royden DASEN, DPM   REFERRING DIAG:  [phone] (ICD-10-CM) - Tear of tendon of left ankle, subsequent encounter  [phone] (ICD-10-CM) - Rupture of plantar fascia of right foot, initial encounter    Rationale for Evaluation and Treatment:  Rehabiliation  THERAPY DIAG:  Pain in left ankle and joints of left foot  Pain in right ankle and  joints of right foot  Muscle weakness (generalized)  Difficulty in walking, not elsewhere classified  ONSET DATE: 5 years of pain   SUBJECTIVE:                                                                                                                                                                                           SUBJECTIVE STATEMENT:she saw ortho about her knee pain, received knee brace to wear. She feels ready to discharge to independent program today.   PERTINENT HISTORY:  See above PMH  PAIN:  NPRS scale:8/10 upon arrival Pain location:plantar part of foot up to her ankle Pain description: burning, ache, feels like it wants to break Aggravating factors: standing, walking,  Relieving factors: hot shower   PRECAUTIONS: ,  None  RED FLAGS: None   WEIGHT BEARING RESTRICTIONS:  No  FALLS:  Has patient fallen in last 6 months? No   OCCUPATION:  disabled  PLOF:  Independent with basic ADLs  PATIENT GOALS:  Help relieve the pain  OBJECTIVE:  Note: Objective measures were completed at Evaluation unless otherwise noted.  DIAGNOSTIC FINDINGS:  08/25/23 MRI Left ankle MPRESSION: Strain/mild intramuscular tear to the proximal quadratus plantae musculature. No planar fasciitis.   Mild focal degenerative change to the medial talar dome.   Mild degenerative change to the second and third tarsometatarsal joints.  MRI right ankle IMPRESSION: Unremarkable exam  PATIENT SURVEYS:  Patient-Specific Activity Scoring Scheme  0 represents "unable to perform." 10 represents "able to perform at prior level. 0 1 2 3 4 5 6 7 8 9  10 (Date and Score)   Activity Eval  12/11/23   1. standing  2 4   2. walking  2 4   3.     4.    5.    Score 2/10 4/10   Total score = sum of the activity scores/number of activities Minimum detectable change (90%CI) for average score =  2 points Minimum detectable change (90%CI) for single activity score = 3  points   GAIT:  Eval: Distance walked: 25 feet Assistive device utilized: Single point cane Level of assistance: Modified independence Comments: increased foot pronation in left, decreased gait speed, increased pain with walking  Ankle  PALPATION: Pain and tenderness in arch of foot  LOWER EXTREMITY ROM:     Active  Left eval Right  Eval Left 12/09/23  Hip flexion     Hip extension     Hip abduction     Hip adduction     Hip internal rotation     Hip external rotation     Knee flexion     Knee extension     Ankle dorsiflexion -2 from neutral 5 5  Ankle plantarflexion 40 50 50  Ankle inversion WNL WNL WNL  Ankle eversion 15 20 25    (Blank rows = not tested)   LOWER EXTREMITY MMT:    MMT Right eval Left eval Left 12/09/23  Hip flexion     Hip extension     Hip abduction     Hip adduction     Hip internal rotation     Hip external rotation     Knee flexion     Knee extension     Ankle dorsiflexion 5 4 4+  Ankle plantarflexion 5 in NWB 4 in NWB 5 in NWB  Ankle inversion 5 4 4+  Ankle eversion 5 4 4+   (Blank rows = not tested)   FUNCTIONAL TESTS:  Eval: Tandem balance test: 4 second average with left foot posterior, 9 second average with right foot posterior.                                                                                                                               TREATMENT DATE:  12/11/23 Therex Nu step seated L5 UE/LE X 10 min seat #6 Seated plantar flexion yellow 2x10 Seated toe curls and toe extensions X 10 each Seated gastroc stretch 2 x 30 Seated soleus stretch2  x 30 Seated knee flexion stretch AAROM 5 sec X 10 Seated SLR X 10  Standing alternating hamstring curls X 10 bilat  12/09/23 Therex Nu step seated L5 UE/LE X 10 min seat #6 Seated plantar flexion yellow 2x10 Seated toe curls and toe extensions X 10 each Seated gastroc stretch 2 x 30 Seated soleus stretch2  x 30 Seated knee flexion stretch AAROM 5 sec X  10 Seated SLR X 10  Standing alternating hamstring curls X 10 bilat   PATIENT EDUCATION: Education details: HEP, PT plan of care, selfcare Person educated: Patient Education method: Explanation, Demonstration, Verbal cues, and Handouts Education comprehension: verbalized understanding, further education recommended   HOME EXERCISE PROGRAM: Access Code: [email or web address] URL: [email or web address] Date: 12/09/2023 Prepared by: Redell Moose  Exercises - Gastroc Stretch on Wall  - 1-2 x daily - 3 x weekly - 1 sets - 3 reps -  30 hold - Soleus Stretch on Wall  - 1-2 x daily - 6 x weekly - 1 sets - 3 reps - 30 hold - Heel Toe Raises with Counter Support  - 1-2 x daily - 6 x weekly - 2 sets - 10 reps - Standing Tandem Balance with Counter Support  - 1-2 x daily - 6 x weekly - 1 sets - 3 reps - 15 sec hold - Seated Ankle Plantarflexion with Resistance  - 1-2 x daily - 6 x weekly - 2 sets - 10 reps - Seated Ankle Eversion with Resistance  - 1-2 x daily - 6 x weekly - 2 sets - 10 reps - Long Sitting Plantar Fascia Stretch with Towel  - 1-2 x daily - 6 x weekly - 2 sets - 10 reps - Seated Ankle Inversion with Resistance  - 1-2 x daily - 6 x weekly - 2 sets - 10 reps - Seated Toe Towel Scrunches  - 1 x daily - 7 x weekly - 2 sets - 10 reps - Toe Yoga - Alternating Great Toe and Lesser Toe Extension  - 1 x daily - 7 x weekly - 2 sets - 10 reps - Toe Spreading  - 1 x daily - 7 x weekly - 2 sets - 10 reps - seated knee flexion stretch with knee extension strengthening  - 2 x daily - 6 x weekly - 1 sets - 10 reps - 5 sec hold - Seated Active Straight-Leg Raise  - 2 x daily - 6 x weekly - 1 sets - 10 reps - Standing Knee Flexion  - 2 x daily - 6 x weekly - 1 sets - 10 reps  ASSESSMENT:  CLINICAL IMPRESSION: She has attended 6 PT visits and her ankle strength and ROM are now pretty good overall but she still has a lot of pain. At this point she feels ready to transition to independent program  and will continue with her exercises at home.   OBJECTIVE IMPAIRMENTS: decreased activity tolerance for ADL's, difficulty walking, decreased balance, decreased endurance, decreased mobility, decreased ROM, decreased strength, impaired flexibility, impaired LE use, and pain.  ACTIVITY LIMITATIONS: standing, walking, cleaning, community activity, driving,  PERSONAL FACTORS: see above PMH  also affecting patient's functional outcome.  REHAB POTENTIAL: Fair    CLINICAL DECISION MAKING: Evolving/moderate complexity  EVALUATION COMPLEXITY: Moderate    GOALS: Short term PT Goals Target date: 11/10/2023   Pt will be I and compliant with HEP. Baseline:  Goal status: MET 11/10/23 Pt will decrease pain by 25% overall Baseline:8 Goal status: Not met 12/09/23  Long term PT goals Target date:11/24/2023  Pt will improve left ankle  DF ROM to [facility] >5 deg to improve functional mobility Baseline: Goal status: MET 12/09/23 Pt will improve  strength to at least 4+/5 MMT to improve functional strength Baseline: Goal status: MET 12/09/23 Pt will improve Patient specific functional scale (PSFS) to at least 5/10 to show improved function level Baseline:2/10 Goal status: not met but did improve to 4/10 12/09/23 Pt will reduce pain to overall less than 4/10 with usual activity and work activity. Baseline:8/10 Goal status: not met 12/09/23 Pt will be able to ambulate community distances at least 500 ft without complaints Baseline: Goal status: not met 12/09/23  PLAN: PT FREQUENCY: 1-2 times per week   PT DURATION: 6-8 weeks  PLANNED INTERVENTIONS (unless contraindicated): 97110-Therapeutic exercises, 97530- Therapeutic activity, 97112- Neuromuscular re-education, 97535- Self Care, 02859- Manual therapy, [id number]- Gait training, [phone]- Electrical  stimulation (unattended), 97035- Ultrasound, 02966- Ionotophoresis 4mg /ml Dexamethasone , and Taping  PLAN FOR NEXT SESSION: DC to independent program.   Redell JONELLE Moose, PT,DPT 12/11/2023, 3:09 PM

## 2023-12-15 ENCOUNTER — Telehealth: Payer: Self-pay | Admitting: Gastroenterology

## 2023-12-15 NOTE — Telephone Encounter (Signed)
 Please let patient know that we are advising she stop Kratom due to risk of liver toxicity, seizures, and substance abuse disorder. Kratom can also contribute to constipation and nausea. I'm not sure how long she has been on it but if using daily, I would suggest she wean off over the next 1-2 weeks.   If she has persistent nausea/vomiting after weaning off kratom, have her let us  know. At that time would recommend repeat EGD to evaluate for ongoing N/V, weight loss, and ulcer healing.

## 2023-12-15 NOTE — Telephone Encounter (Signed)
 Pt was made aware and verbalized understanding.

## 2023-12-16 ENCOUNTER — Encounter: Admitting: Physical Therapy

## 2023-12-17 ENCOUNTER — Ambulatory Visit: Admitting: Orthopedic Surgery

## 2023-12-18 ENCOUNTER — Encounter: Admitting: Physical Therapy

## 2023-12-22 ENCOUNTER — Encounter: Admitting: Physical Therapy

## 2023-12-24 ENCOUNTER — Encounter: Admitting: Physical Therapy

## 2023-12-27 ENCOUNTER — Other Ambulatory Visit: Payer: Self-pay | Admitting: Allergy & Immunology

## 2023-12-31 ENCOUNTER — Ambulatory Visit (INDEPENDENT_AMBULATORY_CARE_PROVIDER_SITE_OTHER): Admitting: Orthopedic Surgery

## 2023-12-31 ENCOUNTER — Encounter: Payer: Self-pay | Admitting: Orthopedic Surgery

## 2023-12-31 DIAGNOSIS — M25562 Pain in left knee: Secondary | ICD-10-CM | POA: Diagnosis not present

## 2023-12-31 NOTE — Progress Notes (Signed)
    12/31/2023   Chief Complaint  Patient presents with   Knee Pain    Left     Encounter Diagnosis  Name Primary?   Acute pain of left knee Yes    What pharmacy do you use ? ______[facility]_____  DOI/DOS/ Date:    Improved a little better/ states swollen more today  Reexamination the left knee  Quadriceps and extensor mechanism is intact Tenderness superior pole patella inferior pole of patella and patellar tendon Swelling around the joint noted as well  Recommend MRI to rule out internal derangement of the knee  Follow-up after MRI

## 2023-12-31 NOTE — Progress Notes (Signed)
    12/31/2023   Chief Complaint  Patient presents with   Knee Pain    Left     No diagnosis found.  What pharmacy do you use ? ______[facility]_____  DOI/DOS/ Date:    Improved a little better/ states swollen more today

## 2023-12-31 NOTE — Patient Instructions (Signed)
Your insurance does not require approval for the MRI please go ahead and call to schedule your appointment with [facility] within at least one (1) week.   Central Scheduling [phone]

## 2024-01-03 ENCOUNTER — Other Ambulatory Visit: Payer: Self-pay | Admitting: Gastroenterology

## 2024-01-03 DIAGNOSIS — K219 Gastro-esophageal reflux disease without esophagitis: Secondary | ICD-10-CM

## 2024-01-04 ENCOUNTER — Ambulatory Visit (HOSPITAL_COMMUNITY)
Admission: RE | Admit: 2024-01-04 | Discharge: 2024-01-04 | Disposition: A | Source: Ambulatory Visit | Attending: Orthopedic Surgery | Admitting: Orthopedic Surgery

## 2024-01-04 DIAGNOSIS — M25562 Pain in left knee: Secondary | ICD-10-CM | POA: Insufficient documentation

## 2024-01-06 ENCOUNTER — Encounter: Payer: Self-pay | Admitting: Gastroenterology

## 2024-01-22 ENCOUNTER — Ambulatory Visit (INDEPENDENT_AMBULATORY_CARE_PROVIDER_SITE_OTHER): Admitting: Orthopedic Surgery

## 2024-01-22 ENCOUNTER — Encounter: Payer: Self-pay | Admitting: Orthopedic Surgery

## 2024-01-22 DIAGNOSIS — M25462 Effusion, left knee: Secondary | ICD-10-CM | POA: Diagnosis not present

## 2024-01-22 DIAGNOSIS — M5410 Radiculopathy, site unspecified: Secondary | ICD-10-CM | POA: Diagnosis not present

## 2024-01-22 MED ORDER — PREDNISONE 10 MG (48) PO TBPK: ORAL_TABLET | Freq: Every day | ORAL | 0 refills | Status: DC

## 2024-01-22 NOTE — Progress Notes (Signed)
   Chief Complaint  Patient presents with   Knee Pain    Left    Results    Review MRI left knee   53 year old female atypical onset of left knee pain after issues with her foot  We tried her on anti-inflammatories and knee rehabilitation exercises she did not improve  We sent her for an MRI of her knee came back normal  Further questioning reveals that she has chronic back pain  She does have knee swelling but the pain may be referred pain from the lower back It is located over the medial side of the knee  Interpretation of MRI No effusion No meniscal tears No ligament tears No cartilage lesions  Recommend Dosepak may be this will help with her back pain and if the leg pain is related to the back it should help  She says that gabapentin  and Lyrica did not help her back pain  No follow-up scheduled patient will let me know how she does with her prednisone  pack since this has worked in the past  She has a knee brace economy hinged which is starting to tear if necessary we can replace it and leave a note for the brace people to replace because of failure of straps  Meds ordered this encounter  Medications   predniSONE  (STERAPRED UNI-PAK 48 TAB) 10 MG (48) TBPK tablet    Sig: Take by mouth daily.    Dispense:  48 tablet    Refill:  0

## 2024-01-22 NOTE — Progress Notes (Signed)
    01/22/2024   Chief Complaint  Patient presents with   Knee Pain    Left    Results    Review MRI left knee    No diagnosis found.  What pharmacy do you use ? ___[facility]________  DOI/DOS/ Date: ongoing  Did you get better, worse or no change (Answer below)   Unchanged  Meds ordered this encounter  Medications   predniSONE  (STERAPRED UNI-PAK 48 TAB) 10 MG (48) TBPK tablet    Sig: Take by mouth daily.    Dispense:  48 tablet    Refill:  0

## 2024-01-22 NOTE — Patient Instructions (Signed)
 ICE FOR SWELLING   PREDNISONE  FOR PAIN

## 2024-02-10 ENCOUNTER — Telehealth: Payer: Self-pay | Admitting: Neurology

## 2024-02-10 ENCOUNTER — Telehealth: Payer: 59 | Admitting: Neurology

## 2024-02-10 NOTE — Telephone Encounter (Signed)
 Patient called to reschedule MyChart Video Visit appointment due to provider out. Patient said did not know had been notified to reschedule appointment.

## 2024-02-25 ENCOUNTER — Encounter (HOSPITAL_BASED_OUTPATIENT_CLINIC_OR_DEPARTMENT_OTHER): Payer: Self-pay | Admitting: Pulmonary Disease

## 2024-02-25 ENCOUNTER — Ambulatory Visit (INDEPENDENT_AMBULATORY_CARE_PROVIDER_SITE_OTHER): Admitting: Pulmonary Disease

## 2024-02-25 VITALS — BP 112/84 | HR 95 | Temp 98.7°F | Ht 63.0 in | Wt 142.4 lb

## 2024-02-25 DIAGNOSIS — J4550 Severe persistent asthma, uncomplicated: Secondary | ICD-10-CM

## 2024-02-25 DIAGNOSIS — R918 Other nonspecific abnormal finding of lung field: Secondary | ICD-10-CM | POA: Diagnosis not present

## 2024-02-25 MED ORDER — SYMBICORT 160-4.5 MCG/ACT IN AERO: 2.0000 | INHALATION_SPRAY | Freq: Two times a day (BID) | RESPIRATORY_TRACT | 5 refills | Status: AC

## 2024-02-25 MED ORDER — INCRUSE ELLIPTA 62.5 MCG/ACT IN AEPB: 1.0000 | INHALATION_SPRAY | Freq: Every day | RESPIRATORY_TRACT | 5 refills | Status: AC

## 2024-02-25 NOTE — Patient Instructions (Signed)
 Multiple new lung nodules LLL lung nodule, stable Previously reviewed PET/CT from 09/2022. Less discernible, no metabolic activity. Not likely malignant --ORDER CT Chest without contrast in Feb 2026  Severe persistent asthma --CONTINUE brand name Symbicort  160-4.5 mcg TWO puffs in the morning and evening. Rinse mouth out after use --CONTINUE Incruse ONE puff ONCE a day --CONTINUE Albuterol  as needed --CONTINUE nebulizer as needed --CONTINUE Dupixent 
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
## 2024-03-18 ENCOUNTER — Encounter: Payer: Self-pay | Admitting: Gastroenterology

## 2024-03-24 ENCOUNTER — Ambulatory Visit: Admitting: Allergy & Immunology

## 2024-03-24 ENCOUNTER — Other Ambulatory Visit: Payer: Self-pay

## 2024-03-24 ENCOUNTER — Telehealth: Admitting: Neurology

## 2024-03-24 ENCOUNTER — Encounter: Payer: Self-pay | Admitting: Allergy & Immunology

## 2024-03-24 VITALS — BP 126/70 | HR 88 | Temp 97.6°F | Resp 18 | Ht 64.0 in | Wt 134.2 lb

## 2024-03-24 DIAGNOSIS — R911 Solitary pulmonary nodule: Secondary | ICD-10-CM

## 2024-03-24 DIAGNOSIS — T7800XD Anaphylactic reaction due to unspecified food, subsequent encounter: Secondary | ICD-10-CM

## 2024-03-24 DIAGNOSIS — T7800XA Anaphylactic reaction due to unspecified food, initial encounter: Secondary | ICD-10-CM

## 2024-03-24 DIAGNOSIS — R569 Unspecified convulsions: Secondary | ICD-10-CM

## 2024-03-24 DIAGNOSIS — G43009 Migraine without aura, not intractable, without status migrainosus: Secondary | ICD-10-CM

## 2024-03-24 DIAGNOSIS — J4551 Severe persistent asthma with (acute) exacerbation: Secondary | ICD-10-CM

## 2024-03-24 DIAGNOSIS — G8929 Other chronic pain: Secondary | ICD-10-CM

## 2024-03-24 DIAGNOSIS — J3089 Other allergic rhinitis: Secondary | ICD-10-CM

## 2024-03-24 DIAGNOSIS — T63481D Toxic effect of venom of other arthropod, accidental (unintentional), subsequent encounter: Secondary | ICD-10-CM

## 2024-03-24 MED ORDER — LEVETIRACETAM 500 MG PO TABS: 500.0000 mg | ORAL_TABLET | Freq: Two times a day (BID) | ORAL | 3 refills | Status: AC

## 2024-03-24 MED ORDER — DOXYCYCLINE HYCLATE 100 MG PO TABS: 100.0000 mg | ORAL_TABLET | Freq: Two times a day (BID) | ORAL | 0 refills | Status: AC

## 2024-03-24 MED ORDER — PREDNISONE 10 MG PO TABS: ORAL_TABLET | ORAL | 0 refills | Status: AC

## 2024-03-24 NOTE — Patient Instructions (Addendum)
 Severe persistent asthma with acute exacerbation - Lung testing looks excellent today. - Start the prednisone  taper that I sent in today.  - Daily controller medication(s): Trelegy 200/62.5/25 one puff once daily in the morning AND Dupixent  300mg  every two weeks - Prior to physical activity: albuterol  2 puffs 10-15 minutes before physical activity. - Rescue medications: albuterol  4 puffs every 4-6 hours as needed - Asthma control goals:  * Full participation in all desired activities (may need albuterol  before activity) * Albuterol  use two time or less a week on average (not counting use with activity) * Cough interfering with sleep two time or less a month * Oral steroids no more than once a year * No hospitalizations  2. Allergic rhinitis (cat dander, tree pollen, weed pollen, grass, and dog) - Continue Xyzal  5mg  to twice daily. - Continue Pataday  one drop per eye daily. - Consider allergy shots for long term control, but this would require weekly visits to the office for a period of time.   3. Right acute otitis media - Add on doxycycline  100mg  twice daily for one week. - I hope this will help clear this up more fully.   4. 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 - Continue to avoid all of these items above.  5. 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 - EpiPen  up to date  6. Chronic pain - We are putting in a referral to [facility].  - Hopefully we can get you in to see someone.   7. Return in about 6 months (around 09/21/2024). You can have the follow up appointment with Dr. Iva or a Nurse Practicioner (our Nurse Practitioners are excellent and always have Physician oversight!).    Please inform us  of any Emergency Department visits, hospitalizations, or changes in symptoms. Call us  before going to the ED for breathing or allergy symptoms since we might be able  to fit you in for a sick visit. Feel free to contact us  anytime with any questions, problems, or concerns.  It was a pleasure to see you again today!  Websites that have reliable patient information: 1. American Academy of Asthma, Allergy, and Immunology: www.aaaai.org 2. Food Allergy Research and Education (FARE): foodallergy.org 3. Mothers of Asthmatics: http://www.asthmacommunitynetwork.org 4. American College of Allergy, Asthma, and Immunology: www.acaai.org      Like us  on [employer] and Instagram for our latest updates!      A healthy democracy works best when [employer] participate! Make sure you are registered to vote! If you have moved or changed any of your contact information, you will need to get this updated before voting! Scan the QR codes below to learn more!
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
## 2024-03-24 NOTE — Patient Instructions (Signed)
 Please restart Keppra .  A refill was sent in.  Please follow-up with your primary care for medication reconciliation.  If headaches increase please let me know.  We can discuss options for management.  Plan to follow-up in 1 year in office.  Reach out sooner if needed.  Thanks!!

## 2024-03-24 NOTE — Progress Notes (Signed)
 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 Kassie)   Complicated past medical history including fibromyalgia   Plan/Recommendations:   Severe persistent asthma with acute exacerbation - Lung testing looks excellent today. - Start the prednisone  taper that I sent in today.  - Daily controller medication(s): Trelegy 200/62.5/25 one puff once daily in the morning AND Dupixent  300mg  every two weeks - Prior to physical activity: albuterol  2 puffs 10-15 minutes before physical activity. - Rescue medications: albuterol  4 puffs every 4-6 hours as needed - Asthma control goals:  * Full participation in all desired activities (may need albuterol  before activity) * Albuterol  use two time or less a week on average (not counting use with activity) * Cough interfering with sleep two time or less a month * Oral steroids no more than once a year * No hospitalizations  2. Allergic rhinitis (cat dander, tree pollen, weed pollen, grass, and dog) - Continue Xyzal  5mg  to twice daily. - Continue Pataday  one drop per eye daily. - Consider allergy shots for long term control, but this would require weekly visits to the office for a period of time.   3. Right acute otitis media - Add on doxycycline  100mg  twice daily for one week. - I hope this will help clear this up more fully.   4. 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 - Continue to avoid all of these items above.  5. 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 - EpiPen  up to date  6. Chronic pain - We are putting in a referral to [facility].  - Hopefully we can get you in to see someone.   7. Return in about 6 months (around 09/21/2024). You can have the follow up appointment with Dr. Iva or a Nurse Practicioner (our Nurse Practitioners are excellent and always have Physician oversight!).   Subjective:   Claire [name] is a 54 y.o. female presenting today for follow up of  Chief Complaint  Patient presents with   Follow-up    Patient presents to the office for a 6 month follow up. Patient states that she believes that she still has infection. Has had a round of antibiotics per 5 days.     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  Discussed the use of AI scribe software for clinical note transcription with the patient and/or guardian, who gave verbal consent to proceed.  Naquisha is a 54 y.o. female presenting  for a follow up visit.  She was last seen in June 2025.  At that time, like testing looked excellent.  We continue with the Trelegy 200 mcg 1 puff daily as well as Dupixent .  Continue with albuterol  as needed.  For her allergic rhinitis, we will continue with Xyzal  as well as Pataday .  Because of her allergy shots for allergen control, but transportation was an issue.  She continue to avoid wheat, rye, barley, and oak.  EpiPen  was up-to-date.  She continues to avoid stinging insects.  Since last visit, she has fairly well until the end of December.  Asthma/Respiratory Symptom History: She has been experiencing persistent symptoms following an RSV infection that began over three weeks ago. She was seen in the emergency room on December 29th, where she was prescribed azithromycin and Decadron . Despite this treatment, she continues to have symptoms including ear congestion, chest tightness, and a rattling sensation in her chest. She also experienced significant weight loss, dropping to 129 pounds, and was bedridden for over a week.  For the same time, her son was diagnosed with pneumonia and her mother was diagnosed with influenza.  She takes her next Dupixent  on Sunday. This has been very helpful for her.  She has been on Dupixent  for well over a year now. She also remains on Trelegy, which she uses as an inhaler with one puff twice a day. She has been on Dupixent  for over a year. She also uses breathing treatments regularly due to her respiratory symptoms.  She has a history of lung nodules, which are being monitored by her pulmonologist. New nodules have appeared in her other lung, but they are too small for biopsy.   Allergic Rhinitis Symptom History: She continues to experience ear congestion, particularly in her left ear, which feels 'super full' and sounds like a 'mumble.' She cleans her ears daily due to discharge, described as 'green mucus.' She also continues to cough up mucus and has nasal  discharge. Her symptoms have been severe enough to impact her daily activities, and she feels only 'seventy-five percent' recovered.  She also experiences 'fibro fog,' which has worsened since her illness, affecting her memory.  She never got a callback from the most recent pain clinic we referred her to.  I will  Otherwise, there have been no changes to her past medical history, surgical history, family history, or social history.    Review of systems otherwise negative other than that mentioned in the HPI.    Objective:   Blood pressure 126/70, pulse 88, temperature 97.6 F (36.4 C), temperature source Temporal, resp. rate 18, height 5' 4 (1.626 m), weight 134 lb 3.2 oz (60.9 kg), SpO2 99%. Body mass index is 23.04 kg/m.    Physical Exam Vitals reviewed.  Constitutional:      Appearance: She is well-developed.     Comments: Walking with a cane. Does not have her dog today.   HENT:     Head: Normocephalic and atraumatic.     Right Ear: Tympanic membrane, ear canal and external ear normal. No drainage, swelling or  tenderness. Tympanic membrane is not injected, scarred, erythematous, retracted or bulging.     Left Ear: Tympanic membrane, ear canal and external ear normal. No drainage, swelling or tenderness. Tympanic membrane is not injected, scarred, erythematous, retracted or bulging.     Nose: No nasal deformity, septal deviation, mucosal edema or rhinorrhea.     Right Turbinates: Enlarged, swollen and pale.     Left Turbinates: Enlarged, swollen and pale.     Right Sinus: No maxillary sinus tenderness or frontal sinus tenderness.     Left Sinus: No maxillary sinus tenderness or frontal sinus tenderness.     Comments: No polyps.    Mouth/Throat:     Mouth: Mucous membranes are not pale and not dry.     Pharynx: Uvula midline.  Eyes:     General: Lids are normal. Allergic shiner present.        Right eye: No discharge.        Left eye: No discharge.      Conjunctiva/sclera: Conjunctivae normal.     Right eye: Right conjunctiva is not injected. No chemosis.    Left eye: Left conjunctiva is not injected. No chemosis.    Pupils: Pupils are equal, round, and reactive to light.  Cardiovascular:     Rate and Rhythm: Normal rate and regular rhythm.     Heart sounds: Normal heart sounds.  Pulmonary:     Effort: Pulmonary effort is normal. No tachypnea, accessory muscle usage or respiratory distress.     Breath sounds: Normal breath sounds. No wheezing, rhonchi or rales.     Comments: Moving air well in all lung fields. No increased work of breathing noted.  Chest:     Chest wall: No tenderness.  Abdominal:     Tenderness: There is no abdominal tenderness. There is no guarding or rebound.  Lymphadenopathy:     Head:     Right side of head: No submandibular, tonsillar or occipital adenopathy.     Left side of head: No submandibular, tonsillar or occipital adenopathy.     Cervical: No cervical adenopathy.  Skin:    Coloration: Skin is not pale.     Findings: No abrasion, erythema, petechiae or rash. Rash is not papular, urticarial or vesicular.  Neurological:     Mental Status: She is alert.  Psychiatric:        Behavior: Behavior is cooperative.      Diagnostic studies:    Spirometry: results normal (FEV1: 2.46/101%, FVC: 3.03/99%, FEV1/FVC: 81%).    Spirometry consistent with normal pattern.   Allergy Studies: none      Marty Shaggy, MD  [facility]        "

## 2024-04-21 ENCOUNTER — Ambulatory Visit (HOSPITAL_COMMUNITY)

## 2024-04-30 ENCOUNTER — Ambulatory Visit (HOSPITAL_COMMUNITY)

## 2024-05-04 ENCOUNTER — Ambulatory Visit (HOSPITAL_BASED_OUTPATIENT_CLINIC_OR_DEPARTMENT_OTHER): Admitting: Pulmonary Disease

## 2024-09-24 ENCOUNTER — Ambulatory Visit: Admitting: Allergy & Immunology

## 2025-03-29 ENCOUNTER — Ambulatory Visit: Admitting: Neurology
